# Patient Record
Sex: Male | Born: 1980
Health system: Southern US, Community
[De-identification: ages and names within clinical notes are randomized; demographics above are authoritative.]

## PROBLEM LIST (undated history)

## (undated) DIAGNOSIS — J9690 Respiratory failure, unspecified, unspecified whether with hypoxia or hypercapnia: Secondary | ICD-10-CM

## (undated) DIAGNOSIS — F419 Anxiety disorder, unspecified: Secondary | ICD-10-CM

## (undated) DIAGNOSIS — F32A Depression, unspecified: Secondary | ICD-10-CM

## (undated) DIAGNOSIS — F319 Bipolar disorder, unspecified: Secondary | ICD-10-CM

## (undated) DIAGNOSIS — G8929 Other chronic pain: Secondary | ICD-10-CM

## (undated) DIAGNOSIS — F1910 Other psychoactive substance abuse, uncomplicated: Secondary | ICD-10-CM

## (undated) DIAGNOSIS — F329 Major depressive disorder, single episode, unspecified: Secondary | ICD-10-CM

## (undated) DIAGNOSIS — J45909 Unspecified asthma, uncomplicated: Secondary | ICD-10-CM

## (undated) DIAGNOSIS — F410 Panic disorder [episodic paroxysmal anxiety] without agoraphobia: Secondary | ICD-10-CM

## (undated) DIAGNOSIS — N19 Unspecified kidney failure: Secondary | ICD-10-CM

## (undated) HISTORY — PX: FRACTURE SURGERY: SHX138

## (undated) HISTORY — PX: WRIST SURGERY: SHX841

## (undated) HISTORY — PX: ABDOMINAL SURGERY: SHX537

---

## 2015-09-15 DIAGNOSIS — K0510 Chronic gingivitis, plaque induced: Secondary | ICD-10-CM | POA: Insufficient documentation

## 2015-09-15 DIAGNOSIS — Z9049 Acquired absence of other specified parts of digestive tract: Secondary | ICD-10-CM | POA: Insufficient documentation

## 2015-09-15 DIAGNOSIS — F1011 Alcohol abuse, in remission: Secondary | ICD-10-CM | POA: Insufficient documentation

## 2015-09-15 DIAGNOSIS — F6089 Other specific personality disorders: Secondary | ICD-10-CM | POA: Diagnosis present

## 2015-09-15 DIAGNOSIS — K921 Melena: Secondary | ICD-10-CM | POA: Insufficient documentation

## 2016-04-22 DIAGNOSIS — S6292XA Unspecified fracture of left wrist and hand, initial encounter for closed fracture: Secondary | ICD-10-CM | POA: Insufficient documentation

## 2016-09-14 DIAGNOSIS — M79641 Pain in right hand: Secondary | ICD-10-CM | POA: Insufficient documentation

## 2017-09-09 DIAGNOSIS — R7989 Other specified abnormal findings of blood chemistry: Secondary | ICD-10-CM | POA: Insufficient documentation

## 2017-10-09 DIAGNOSIS — I10 Essential (primary) hypertension: Secondary | ICD-10-CM

## 2017-11-30 DIAGNOSIS — S37012A Minor contusion of left kidney, initial encounter: Secondary | ICD-10-CM | POA: Insufficient documentation

## 2018-11-29 ENCOUNTER — Encounter (HOSPITAL_COMMUNITY): Payer: Self-pay

## 2018-11-29 ENCOUNTER — Other Ambulatory Visit: Payer: Self-pay

## 2018-11-29 ENCOUNTER — Emergency Department (HOSPITAL_COMMUNITY)
Admission: EM | Admit: 2018-11-29 | Discharge: 2018-11-30 | Disposition: A | Discharge status: Home / Self Care | Payer: Self-pay | Attending: Emergency Medicine | Admitting: Emergency Medicine

## 2018-11-29 ENCOUNTER — Emergency Department (HOSPITAL_COMMUNITY): Payer: Self-pay

## 2018-11-29 DIAGNOSIS — F17210 Nicotine dependence, cigarettes, uncomplicated: Secondary | ICD-10-CM | POA: Insufficient documentation

## 2018-11-29 DIAGNOSIS — S0181XA Laceration without foreign body of other part of head, initial encounter: Secondary | ICD-10-CM

## 2018-11-29 DIAGNOSIS — Y939 Activity, unspecified: Secondary | ICD-10-CM | POA: Insufficient documentation

## 2018-11-29 DIAGNOSIS — Y999 Unspecified external cause status: Secondary | ICD-10-CM | POA: Insufficient documentation

## 2018-11-29 DIAGNOSIS — Y929 Unspecified place or not applicable: Secondary | ICD-10-CM | POA: Insufficient documentation

## 2018-11-29 DIAGNOSIS — S01411A Laceration without foreign body of right cheek and temporomandibular area, initial encounter: Secondary | ICD-10-CM | POA: Insufficient documentation

## 2018-11-29 HISTORY — DX: Major depressive disorder, single episode, unspecified: F32.9

## 2018-11-29 HISTORY — DX: Anxiety disorder, unspecified: F41.9

## 2018-11-29 HISTORY — DX: Panic disorder (episodic paroxysmal anxiety): F41.0

## 2018-11-29 HISTORY — DX: Unspecified kidney failure: N19

## 2018-11-29 HISTORY — DX: Respiratory failure, unspecified, unspecified whether with hypoxia or hypercapnia: J96.90

## 2018-11-29 HISTORY — DX: Depression, unspecified: F32.A

## 2018-11-29 MED ORDER — HYDROCODONE-ACETAMINOPHEN 5-325 MG PO TABS
1.0000 | ORAL_TABLET | Freq: Once | ORAL | Status: AC
  Administered 2018-11-29: 1 via ORAL
  Filled 2018-11-29: qty 1
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
## 2018-11-30 MED ORDER — LIDOCAINE HCL (PF) 2 % IJ SOLN
INTRAMUSCULAR | Status: AC
  Filled 2018-11-30: qty 20

## 2018-11-30 MED ORDER — POVIDONE-IODINE 10 % EX SOLN
CUTANEOUS | Status: AC
  Filled 2018-11-30: qty 15

## 2018-11-30 MED ORDER — ALPRAZOLAM 0.5 MG PO TABS
0.2500 mg | ORAL_TABLET | Freq: Once | ORAL | Status: AC
  Administered 2018-11-30: 0.25 mg via ORAL
  Filled 2018-11-30: qty 1

## 2018-11-30 MED ORDER — IBUPROFEN 400 MG PO TABS
600.0000 mg | ORAL_TABLET | Freq: Once | ORAL | Status: AC
  Administered 2018-11-30: 600 mg via ORAL
  Filled 2018-11-30: qty 2

## 2018-11-30 NOTE — Discharge Instructions (Signed)
Suture removal in 7 days

## 2018-12-07 ENCOUNTER — Other Ambulatory Visit: Payer: Self-pay

## 2018-12-07 ENCOUNTER — Emergency Department (HOSPITAL_COMMUNITY): Payer: Self-pay

## 2018-12-07 ENCOUNTER — Encounter (HOSPITAL_COMMUNITY): Payer: Self-pay

## 2018-12-07 ENCOUNTER — Emergency Department (HOSPITAL_COMMUNITY)
Admission: EM | Admit: 2018-12-07 | Discharge: 2018-12-07 | Disposition: A | Discharge status: Home / Self Care | Payer: Self-pay | Attending: Emergency Medicine | Admitting: Emergency Medicine

## 2018-12-07 DIAGNOSIS — R079 Chest pain, unspecified: Secondary | ICD-10-CM | POA: Insufficient documentation

## 2018-12-07 DIAGNOSIS — Z765 Malingerer [conscious simulation]: Secondary | ICD-10-CM | POA: Insufficient documentation

## 2018-12-07 HISTORY — DX: Other chronic pain: G89.29
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
## 2018-12-07 NOTE — ED Notes (Signed)
Not in WR when called by lab
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
## 2018-12-07 NOTE — ED Notes (Signed)
Not in WR

## 2018-12-07 NOTE — ED Notes (Signed)
Pt states he is tired of waiting and wants to leave, signed out ama

## 2018-12-08 ENCOUNTER — Encounter (HOSPITAL_COMMUNITY): Payer: Self-pay | Admitting: *Deleted

## 2018-12-08 ENCOUNTER — Other Ambulatory Visit: Payer: Self-pay

## 2018-12-08 ENCOUNTER — Emergency Department (HOSPITAL_COMMUNITY)
Admission: EM | Admit: 2018-12-08 | Discharge: 2018-12-08 | Disposition: A | Discharge status: Other Acute Inpatient Hospital | Payer: Self-pay | Attending: Emergency Medicine | Admitting: Emergency Medicine

## 2018-12-08 DIAGNOSIS — F17210 Nicotine dependence, cigarettes, uncomplicated: Secondary | ICD-10-CM | POA: Insufficient documentation

## 2018-12-08 DIAGNOSIS — F1910 Other psychoactive substance abuse, uncomplicated: Secondary | ICD-10-CM | POA: Insufficient documentation

## 2018-12-08 DIAGNOSIS — F1020 Alcohol dependence, uncomplicated: Secondary | ICD-10-CM | POA: Insufficient documentation

## 2018-12-08 DIAGNOSIS — R45851 Suicidal ideations: Secondary | ICD-10-CM | POA: Insufficient documentation

## 2018-12-08 DIAGNOSIS — Z046 Encounter for general psychiatric examination, requested by authority: Secondary | ICD-10-CM | POA: Insufficient documentation

## 2018-12-08 DIAGNOSIS — F312 Bipolar disorder, current episode manic severe with psychotic features: Secondary | ICD-10-CM | POA: Insufficient documentation

## 2018-12-08 DIAGNOSIS — F4310 Post-traumatic stress disorder, unspecified: Secondary | ICD-10-CM | POA: Insufficient documentation

## 2018-12-08 LAB — CBC WITH DIFFERENTIAL/PLATELET
Abs Immature Granulocytes: 0.04 10*3/uL (ref 0.00–0.07)
Basophils Absolute: 0.1 10*3/uL (ref 0.0–0.1)
Basophils Relative: 0 %
Eosinophils Absolute: 0 10*3/uL (ref 0.0–0.5)
Eosinophils Relative: 0 %
HCT: 45.5 % (ref 39.0–52.0)
Hemoglobin: 14.5 g/dL (ref 13.0–17.0)
Immature Granulocytes: 0 %
Lymphocytes Relative: 22 %
Lymphs Abs: 2.5 10*3/uL (ref 0.7–4.0)
MCH: 29.8 pg (ref 26.0–34.0)
MCHC: 31.9 g/dL (ref 30.0–36.0)
MCV: 93.4 fL (ref 80.0–100.0)
Monocytes Absolute: 0.7 10*3/uL (ref 0.1–1.0)
Monocytes Relative: 6 %
Neutro Abs: 8 10*3/uL — ABNORMAL HIGH (ref 1.7–7.7)
Neutrophils Relative %: 72 %
PLATELETS: 377 10*3/uL (ref 150–400)
RBC: 4.87 MIL/uL (ref 4.22–5.81)
RDW: 14 % (ref 11.5–15.5)
WBC: 11.3 10*3/uL — ABNORMAL HIGH (ref 4.0–10.5)
nRBC: 0 % (ref 0.0–0.2)

## 2018-12-08 LAB — RAPID URINE DRUG SCREEN, HOSP PERFORMED
AMPHETAMINES: NOT DETECTED
Barbiturates: NOT DETECTED
Benzodiazepines: POSITIVE — AB
Cocaine: POSITIVE — AB
Opiates: NOT DETECTED
Tetrahydrocannabinol: NOT DETECTED

## 2018-12-08 LAB — COMPREHENSIVE METABOLIC PANEL
ALT: 32 U/L (ref 0–44)
ANION GAP: 13 (ref 5–15)
AST: 31 U/L (ref 15–41)
Albumin: 5 g/dL (ref 3.5–5.0)
Alkaline Phosphatase: 96 U/L (ref 38–126)
BUN: 15 mg/dL (ref 6–20)
CALCIUM: 9.7 mg/dL (ref 8.9–10.3)
CO2: 23 mmol/L (ref 22–32)
Chloride: 106 mmol/L (ref 98–111)
Creatinine, Ser: 1.1 mg/dL (ref 0.61–1.24)
GFR calc Af Amer: >60 mL/min (ref >60)
GFR calc non Af Amer: >60 mL/min (ref >60)
Glucose, Bld: 110 mg/dL — ABNORMAL HIGH (ref 70–99)
Potassium: 3.6 mmol/L (ref 3.5–5.1)
Sodium: 142 mmol/L (ref 135–145)
Total Bilirubin: 0.3 mg/dL (ref 0.3–1.2)
Total Protein: 8.2 g/dL — ABNORMAL HIGH (ref 6.5–8.1)

## 2018-12-08 LAB — CK: Total CK: 522 U/L — ABNORMAL HIGH (ref 49–397)

## 2018-12-08 LAB — ETHANOL: Alcohol, Ethyl (B): 21 mg/dL — ABNORMAL HIGH (ref <10)

## 2018-12-08 LAB — TROPONIN I: Troponin I: <0.03 ng/mL (ref <0.03)

## 2018-12-08 LAB — ACETAMINOPHEN LEVEL: Acetaminophen (Tylenol), Serum: <10 ug/mL — ABNORMAL LOW (ref 10–30)

## 2018-12-08 LAB — SALICYLATE LEVEL

## 2018-12-08 MED ORDER — DIPHENHYDRAMINE HCL 50 MG/ML IJ SOLN
25.0000 mg | Freq: Once | INTRAMUSCULAR | Status: AC
  Administered 2018-12-08: 25 mg via INTRAMUSCULAR
  Filled 2018-12-08: qty 1

## 2018-12-08 MED ORDER — NICOTINE 21 MG/24HR TD PT24
21.0000 mg | MEDICATED_PATCH | Freq: Once | TRANSDERMAL | Status: DC
  Administered 2018-12-08: 21 mg via TRANSDERMAL
  Filled 2018-12-08: qty 1

## 2018-12-08 MED ORDER — IBUPROFEN 800 MG PO TABS
800.0000 mg | ORAL_TABLET | Freq: Once | ORAL | Status: AC
  Administered 2018-12-08: 800 mg via ORAL
  Filled 2018-12-08: qty 1

## 2018-12-08 MED ORDER — ZIPRASIDONE MESYLATE 20 MG IM SOLR: 20.0000 mg | Freq: Once | INTRAMUSCULAR | Status: DC

## 2018-12-08 MED ORDER — LORAZEPAM 2 MG/ML IJ SOLN
1.0000 mg | Freq: Once | INTRAMUSCULAR | Status: AC
  Administered 2018-12-08: 1 mg via INTRAMUSCULAR
  Filled 2018-12-08: qty 1

## 2018-12-08 MED ORDER — LORAZEPAM 1 MG PO TABS
1.0000 mg | ORAL_TABLET | Freq: Once | ORAL | Status: AC
  Administered 2018-12-08: 1 mg via ORAL
  Filled 2018-12-08: qty 1

## 2018-12-08 NOTE — ED Notes (Addendum)
Called AC to obtain sitter for pt. Gina, AC,stated one of the ED techs would have to sit.

## 2018-12-08 NOTE — ED Notes (Addendum)
Deleted note - wrong pt
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
## 2018-12-08 NOTE — ED Notes (Signed)
Ford, [facility], stated pt would be inpatient at [facility] after 10 pm.

## 2018-12-08 NOTE — ED Notes (Signed)
IVC papers faxed to Magistrate, and called.
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
## 2018-12-08 NOTE — ED Notes (Addendum)
Patient requested to be tested for HIV, and Hep C. Patient also requested a Nicotine patch. Pt stated he is very hungry. Gave pt food tray and pt states he is still hungry. Pt ate 4 packs of graham crackers and 2 peanut butters. Pt has had 2 ginger ales in addition to food tray items.

## 2018-12-09 ENCOUNTER — Encounter (HOSPITAL_COMMUNITY): Payer: Self-pay

## 2018-12-09 ENCOUNTER — Other Ambulatory Visit: Payer: Self-pay

## 2018-12-09 ENCOUNTER — Inpatient Hospital Stay (HOSPITAL_COMMUNITY)
Admission: AD | Admit: 2018-12-09 | Discharge: 2018-12-12 | DRG: 897 | Disposition: A | Discharge status: Home / Self Care | Payer: Federal, State, Local not specified - Other | Source: Intra-hospital | Attending: Psychiatry | Admitting: Psychiatry

## 2018-12-09 DIAGNOSIS — Z915 Personal history of self-harm: Secondary | ICD-10-CM

## 2018-12-09 DIAGNOSIS — G8929 Other chronic pain: Secondary | ICD-10-CM | POA: Diagnosis present

## 2018-12-09 DIAGNOSIS — G4700 Insomnia, unspecified: Secondary | ICD-10-CM | POA: Diagnosis present

## 2018-12-09 DIAGNOSIS — I10 Essential (primary) hypertension: Secondary | ICD-10-CM | POA: Diagnosis present

## 2018-12-09 DIAGNOSIS — Z590 Homelessness: Secondary | ICD-10-CM | POA: Diagnosis not present

## 2018-12-09 DIAGNOSIS — Z7951 Long term (current) use of inhaled steroids: Secondary | ICD-10-CM

## 2018-12-09 DIAGNOSIS — J449 Chronic obstructive pulmonary disease, unspecified: Secondary | ICD-10-CM | POA: Diagnosis present

## 2018-12-09 DIAGNOSIS — F6381 Intermittent explosive disorder: Secondary | ICD-10-CM | POA: Diagnosis present

## 2018-12-09 DIAGNOSIS — F1020 Alcohol dependence, uncomplicated: Principal | ICD-10-CM | POA: Diagnosis present

## 2018-12-09 DIAGNOSIS — F13239 Sedative, hypnotic or anxiolytic dependence with withdrawal, unspecified: Secondary | ICD-10-CM | POA: Diagnosis present

## 2018-12-09 DIAGNOSIS — F4310 Post-traumatic stress disorder, unspecified: Secondary | ICD-10-CM | POA: Diagnosis present

## 2018-12-09 DIAGNOSIS — F319 Bipolar disorder, unspecified: Secondary | ICD-10-CM | POA: Diagnosis not present

## 2018-12-09 DIAGNOSIS — Z888 Allergy status to other drugs, medicaments and biological substances status: Secondary | ICD-10-CM | POA: Diagnosis not present

## 2018-12-09 DIAGNOSIS — F410 Panic disorder [episodic paroxysmal anxiety] without agoraphobia: Secondary | ICD-10-CM | POA: Diagnosis present

## 2018-12-09 DIAGNOSIS — F17210 Nicotine dependence, cigarettes, uncomplicated: Secondary | ICD-10-CM | POA: Diagnosis present

## 2018-12-09 DIAGNOSIS — Z79899 Other long term (current) drug therapy: Secondary | ICD-10-CM

## 2018-12-09 DIAGNOSIS — Z885 Allergy status to narcotic agent status: Secondary | ICD-10-CM | POA: Diagnosis not present

## 2018-12-09 DIAGNOSIS — F3164 Bipolar disorder, current episode mixed, severe, with psychotic features: Secondary | ICD-10-CM | POA: Diagnosis present

## 2018-12-09 DIAGNOSIS — F1021 Alcohol dependence, in remission: Secondary | ICD-10-CM

## 2018-12-09 HISTORY — DX: Unspecified asthma, uncomplicated: J45.909

## 2018-12-09 MED ORDER — FOLIC ACID 1 MG PO TABS
1.0000 mg | ORAL_TABLET | Freq: Every day | ORAL | Status: DC
  Administered 2018-12-10 – 2018-12-12 (×3): 1 mg via ORAL
  Filled 2018-12-09 (×8): qty 1

## 2018-12-09 MED ORDER — FLUTICASONE PROPIONATE HFA 44 MCG/ACT IN AERO
2.0000 | INHALATION_SPRAY | Freq: Two times a day (BID) | RESPIRATORY_TRACT | Status: DC
  Administered 2018-12-09 – 2018-12-11 (×5): 2 via RESPIRATORY_TRACT
  Filled 2018-12-09: qty 10.6

## 2018-12-09 MED ORDER — HYDROXYZINE HCL 25 MG PO TABS
25.0000 mg | ORAL_TABLET | Freq: Three times a day (TID) | ORAL | Status: DC | PRN
  Administered 2018-12-09: 25 mg via ORAL
  Filled 2018-12-09: qty 1
  Filled 2018-12-09: qty 10
  Filled 2018-12-09: qty 1
  Filled 2018-12-09: qty 10

## 2018-12-09 MED ORDER — MAGNESIUM HYDROXIDE 400 MG/5ML PO SUSP: 30.0000 mL | Freq: Every day | ORAL | Status: DC | PRN

## 2018-12-09 MED ORDER — BUSPIRONE HCL 10 MG PO TABS
10.0000 mg | ORAL_TABLET | Freq: Two times a day (BID) | ORAL | Status: DC
  Administered 2018-12-09 – 2018-12-10 (×4): 10 mg via ORAL
  Filled 2018-12-09: qty 1
  Filled 2018-12-09: qty 2
  Filled 2018-12-09 (×6): qty 1

## 2018-12-09 MED ORDER — LOPERAMIDE HCL 2 MG PO CAPS
2.0000 mg | ORAL_CAPSULE | ORAL | Status: DC | PRN
  Administered 2018-12-09: 2 mg via ORAL
  Filled 2018-12-09: qty 1

## 2018-12-09 MED ORDER — CHLORDIAZEPOXIDE HCL 25 MG PO CAPS
25.0000 mg | ORAL_CAPSULE | Freq: Four times a day (QID) | ORAL | Status: DC | PRN
  Administered 2018-12-09 – 2018-12-11 (×2): 25 mg via ORAL
  Filled 2018-12-09 (×2): qty 1

## 2018-12-09 MED ORDER — VITAMIN B-1 100 MG PO TABS
100.0000 mg | ORAL_TABLET | Freq: Every day | ORAL | Status: DC
  Administered 2018-12-10 – 2018-12-12 (×3): 100 mg via ORAL
  Filled 2018-12-09 (×7): qty 1

## 2018-12-09 MED ORDER — QUETIAPINE FUMARATE 300 MG PO TABS
300.0000 mg | ORAL_TABLET | Freq: Every day | ORAL | Status: DC
  Administered 2018-12-09 – 2018-12-11 (×3): 300 mg via ORAL
  Filled 2018-12-09 (×3): qty 1
  Filled 2018-12-09: qty 7
  Filled 2018-12-09: qty 1

## 2018-12-09 MED ORDER — TRAZODONE HCL 50 MG PO TABS
50.0000 mg | ORAL_TABLET | Freq: Every evening | ORAL | Status: DC | PRN
  Administered 2018-12-09 – 2018-12-11 (×4): 50 mg via ORAL
  Filled 2018-12-09 (×3): qty 1
  Filled 2018-12-09: qty 7
  Filled 2018-12-09: qty 1

## 2018-12-09 MED ORDER — ALUM & MAG HYDROXIDE-SIMETH 200-200-20 MG/5ML PO SUSP: 30.0000 mL | ORAL | Status: DC | PRN

## 2018-12-09 MED ORDER — NIFEDIPINE ER 30 MG PO TB24
30.0000 mg | ORAL_TABLET | Freq: Every day | ORAL | Status: DC
  Administered 2018-12-09 – 2018-12-12 (×4): 30 mg via ORAL
  Filled 2018-12-09 (×2): qty 1
  Filled 2018-12-09: qty 7
  Filled 2018-12-09 (×4): qty 1

## 2018-12-09 MED ORDER — IBUPROFEN 400 MG PO TABS: 400.0000 mg | ORAL_TABLET | Freq: Four times a day (QID) | ORAL | Status: DC | PRN

## 2018-12-09 MED ORDER — ACETAMINOPHEN 325 MG PO TABS
650.0000 mg | ORAL_TABLET | Freq: Four times a day (QID) | ORAL | Status: DC | PRN
  Administered 2018-12-10 – 2018-12-12 (×4): 650 mg via ORAL
  Filled 2018-12-09 (×4): qty 2

## 2018-12-09 MED ORDER — QUETIAPINE FUMARATE 50 MG PO TABS
50.0000 mg | ORAL_TABLET | Freq: Every day | ORAL | Status: DC
  Administered 2018-12-09 – 2018-12-12 (×4): 50 mg via ORAL
  Filled 2018-12-09: qty 1
  Filled 2018-12-09: qty 7
  Filled 2018-12-09 (×6): qty 1

## 2018-12-09 MED ORDER — NICOTINE 21 MG/24HR TD PT24
21.0000 mg | MEDICATED_PATCH | Freq: Every day | TRANSDERMAL | Status: DC
  Administered 2018-12-09 – 2018-12-12 (×4): 21 mg via TRANSDERMAL
  Filled 2018-12-09 (×7): qty 1

## 2018-12-09 NOTE — Progress Notes (Signed)
DAR NOTE: Pt present with flat affect and depressed mood in the unit. Pt has been isolating himself and has been bed most of the am. Pt stated he had a rough night, complained of withdrawal symptoms and and has been asking for benzos   Pt denies physical pain, took all his meds as scheduled.  Pt had a poor night sleep, good appetite, normal energy, and good concentration. Pt rate depression at 8, hopeless ness at 7, and anxiety at 8. Pt's safety ensured with 15 minute and environmental checks. Pt currently denies SI/HI and A/V hallucinations. Pt verbally agrees to seek staff if SI/HI or A/VH occurs and to consult with staff before acting on these thoughts. Will continue POC.

## 2018-12-09 NOTE — Tx Team (Signed)
Initial Treatment Plan 12/09/2018 2:11 AM Lance Abbott ZOX:[id number]    PATIENT STRESSORS: Financial difficulties Health problems Marital or family conflict Substance abuse   PATIENT STRENGTHS: General fund of knowledge Motivation for treatment/growth   PATIENT IDENTIFIED PROBLEMS:  risk for suicide  Psychosis / paranoia  PTSD  "help with anything I can, better myself, get reevaluated, off alcohol, get meds right"               DISCHARGE CRITERIA:  Ability to meet basic life and health needs Adequate post-discharge living arrangements Improved stabilization in mood, thinking, and/or behavior Verbal commitment to aftercare and medication compliance  PRELIMINARY DISCHARGE PLAN: Attend aftercare/continuing care group Outpatient therapy  PATIENT/FAMILY INVOLVEMENT: This treatment plan has been presented to and reviewed with the patient, Lance Abbott.  The patient and family have been given the opportunity to ask questions and make suggestions.  Delos HaringPhillips, Chrisanne Loose A, RN 12/09/2018, 2:11 AM

## 2018-12-09 NOTE — Progress Notes (Signed)
D:  Pt did come up to take hs medications.  He declined to take vistaril.  He did take his hs medications.  He adamantly denied SI/HI.  He continued to report auditory hallucinations and described them as "helpful voices."  He remained irritable about not getting something for withdrawal.  Encouraged him to talk with the doctor tomorrow about his concerns.  He reported "I just want to get out of here so I can drink, since you guys aren't giving anything to help with withdrawal."  CIWA was assessed to be a 4 at last check.   A:  1:1 with RN for support and encouragement.  Medications as ordered.  Q 15 minute checks maintained for safety.  Encouraged participation in group and unit activities.   R:  Foster remains safe on the unit.  We will continue to monitor the progress towards his goals.
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
## 2018-12-09 NOTE — Progress Notes (Signed)
Pt refused his evening medications after he was told he could not have his librium because he did not meet the criteria. Pt used the F word and walked away.
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
## 2018-12-09 NOTE — [facility] Group Notes (Signed)
[facility] LCSW Group Therapy Note  Date/Time:  12/09/2018  11:00AM-12:00PM  Type of Therapy and Topic:  Group Therapy:  Music and Mood  Participation Level:  Did Not Attend   Description of Group: In this process group, members listened to a variety of genres of music and identified that different types of music evoke different responses.  Patients were encouraged to identify music that was soothing for them and music that was energizing for them.  Patients discussed how this knowledge can help with wellness and recovery in various ways including managing depression and anxiety as well as encouraging healthy sleep habits.    Therapeutic Goals: 1. Patients will explore the impact of different varieties of music on mood 2. Patients will verbalize the thoughts they have when listening to different types of music 3. Patients will identify music that is soothing to them as well as music that is energizing to them 4. Patients will discuss how to use this knowledge to assist in maintaining wellness and recovery 5. Patients will explore the use of music as a coping skill  Summary of Patient Progress:  N/A  Therapeutic Modalities: Solution Focused Brief Therapy Activity   Lashawndra Lampkins Grossman-Orr, LCSW    

## 2018-12-09 NOTE — Progress Notes (Signed)
Pt came up to the nurses station earlier stating he was having multiple withdrawal symptoms.  "I woke up sweaty, my bed was wet and I am having tremors."  His vital signs were stable.  RN checked bed sheets and no wetness noted.  He was noted sitting in the day room eating ice cream/popcorn and watching the Superbowl.  No tremors noted with the spoon while eating ice cream. RN gave him his 8pm inhaler which he just expelled two puffs to the air and handed it back.  Offered Vistraril for anxiety and he told RN that "you can stick the vistaril up your ass."  He continued to sit in the day room after RN left, eating popcorn watching the foot ball game.  We will continue to monitor for withdrawal symptoms.

## 2018-12-10 DIAGNOSIS — F319 Bipolar disorder, unspecified: Secondary | ICD-10-CM

## 2018-12-10 MED ORDER — CHLORDIAZEPOXIDE HCL 25 MG PO CAPS
25.0000 mg | ORAL_CAPSULE | Freq: Three times a day (TID) | ORAL | Status: AC
  Administered 2018-12-11 (×3): 25 mg via ORAL
  Filled 2018-12-10 (×3): qty 1

## 2018-12-10 MED ORDER — CHLORDIAZEPOXIDE HCL 25 MG PO CAPS: 25.0000 mg | ORAL_CAPSULE | Freq: Every day | ORAL | Status: DC

## 2018-12-10 MED ORDER — CHLORDIAZEPOXIDE HCL 25 MG PO CAPS
25.0000 mg | ORAL_CAPSULE | Freq: Four times a day (QID) | ORAL | Status: AC
  Administered 2018-12-10 (×4): 25 mg via ORAL
  Filled 2018-12-10 (×4): qty 1

## 2018-12-10 MED ORDER — CARBAMAZEPINE 200 MG PO TABS
200.0000 mg | ORAL_TABLET | Freq: Three times a day (TID) | ORAL | Status: DC
  Administered 2018-12-10 (×2): 200 mg via ORAL
  Filled 2018-12-10 (×15): qty 1

## 2018-12-10 MED ORDER — CHLORDIAZEPOXIDE HCL 25 MG PO CAPS
25.0000 mg | ORAL_CAPSULE | ORAL | Status: DC
  Administered 2018-12-12: 25 mg via ORAL
  Filled 2018-12-10: qty 1

## 2018-12-10 NOTE — [facility] Group Notes (Signed)
[facility] LCSW Group Therapy Note  Date/Time: 12/10/18, 1300  Type of Therapy and Topic:  Group Therapy:  Overcoming Obstacles  Participation Level:  Did not attend  Description of Group:    In this group patients will be encouraged to explore what they see as obstacles to their own wellness and recovery. They will be guided to discuss their thoughts, feelings, and behaviors related to these obstacles. The group will process together ways to cope with barriers, with attention given to specific choices patients can make. Each patient will be challenged to identify changes they are motivated to make in order to overcome their obstacles. This group will be process-oriented, with patients participating in exploration of their own experiences as well as giving and receiving support and challenge from other group members.  Therapeutic Goals: 1. Patient will identify personal and current obstacles as they relate to admission. 2. Patient will identify barriers that currently interfere with their wellness or overcoming obstacles.  3. Patient will identify feelings, thought process and behaviors related to these barriers. 4. Patient will identify two changes they are willing to make to overcome these obstacles:    Summary of Patient Progress      Therapeutic Modalities:   Cognitive Behavioral Therapy Solution Focused Therapy Motivational Interviewing Relapse Prevention Therapy  [employer], LCSW

## 2018-12-10 NOTE — [facility] Suicide Risk Assessment (Signed)
[facility] INPATIENT:  Family/Significant Other Suicide Prevention Education  Suicide Prevention Education:  Patient Refusal for Family/Significant Other Suicide Prevention Education: The patient Lance Abbott has refused to provide written consent for family/significant other to be provided Family/Significant Other Suicide Prevention Education during admission and/or prior to discharge.  Physician notified.  Lorri Frederick, LCSW 12/10/2018, 11:15 AM

## 2018-12-10 NOTE — Tx Team (Signed)
Interdisciplinary Treatment and Diagnostic Plan Update  12/10/2018 Time of Session: [place] MRN: [id number]  Principal Diagnosis: <principal problem not specified>  Secondary Diagnoses: Active Problems:   Bipolar disorder with psychotic features ([place])   Current Medications:  Current Facility-Administered Medications  Medication Dose Route Frequency Provider Last Rate Last Dose  . acetaminophen (TYLENOL) tablet 650 mg  650 mg Oral Q6H PRN Lindon Romp A, NP   650 mg at 12/10/18 1146  . alum & mag hydroxide-simeth (MAALOX/MYLANTA) 200-200-20 MG/5ML suspension 30 mL  30 mL Oral Q4H PRN Lindon Romp A, NP      . busPIRone (BUSPAR) tablet 10 mg  10 mg Oral BID Sharma Covert, MD   10 mg at 12/10/18 0851  . carbamazepine (TEGRETOL) tablet 200 mg  200 mg Oral TID Johnn Hai, MD   200 mg at 12/10/18 0853  . chlordiazePOXIDE (LIBRIUM) capsule 25 mg  25 mg Oral QID PRN Sharma Covert, MD   25 mg at 12/09/18 1053  . chlordiazePOXIDE (LIBRIUM) capsule 25 mg  25 mg Oral QID Johnn Hai, MD   25 mg at 12/10/18 1146   Followed by  . [START ON 12/11/2018] chlordiazePOXIDE (LIBRIUM) capsule 25 mg  25 mg Oral TID Johnn Hai, MD       Followed by  . [START ON 12/12/2018] chlordiazePOXIDE (LIBRIUM) capsule 25 mg  25 mg Oral Celine Ahr, MD       Followed by  . [START ON 12/13/2018] chlordiazePOXIDE (LIBRIUM) capsule 25 mg  25 mg Oral Daily Johnn Hai, MD      . fluticasone (FLOVENT HFA) 44 MCG/ACT inhaler 2 puff  2 puff Inhalation BID Sharma Covert, MD   2 puff at 12/10/18 [phone]  . folic acid (FOLVITE) tablet 1 mg  1 mg Oral Daily Sharma Covert, MD   1 mg at 12/10/18 0851  . hydrOXYzine (ATARAX/VISTARIL) tablet 25 mg  25 mg Oral TID PRN Lindon Romp A, NP   25 mg at 12/09/18 0200  . loperamide (IMODIUM) capsule 2 mg  2 mg Oral PRN Lindon Romp A, NP   2 mg at 12/09/18 0200  . magnesium hydroxide (MILK OF MAGNESIA) suspension 30 mL  30 mL Oral Daily PRN Lindon Romp A, NP       . nicotine (NICODERM CQ - dosed in mg/24 hours) patch 21 mg  21 mg Transdermal Daily Sharma Covert, MD   21 mg at 12/10/18 [phone]  . NIFEdipine (ADALAT CC) 24 hr tablet 30 mg  30 mg Oral Daily Sharma Covert, MD   30 mg at 12/10/18 0851  . QUEtiapine (SEROQUEL) tablet 300 mg  300 mg Oral QHS Sharma Covert, MD   300 mg at 12/09/18 2105  . QUEtiapine (SEROQUEL) tablet 50 mg  50 mg Oral Daily Sharma Covert, MD   50 mg at 12/10/18 0851  . thiamine (VITAMIN B-1) tablet 100 mg  100 mg Oral Daily Sharma Covert, MD   100 mg at 12/10/18 0851  . traZODone (DESYREL) tablet 50 mg  50 mg Oral QHS PRN Lindon Romp A, NP   50 mg at 12/09/18 2105   PTA Medications: No medications prior to admission.    Patient Stressors: Financial difficulties Health problems Marital or family conflict Substance abuse  Patient Strengths: Technical sales engineer for treatment/growth  Treatment Modalities: Medication Management, Group therapy, Case management,  1 to 1 session with clinician, Psychoeducation, Recreational therapy.   Physician  Treatment Plan for Primary Diagnosis: <principal problem not specified> Long Term Goal(s): Improvement in symptoms so as ready for discharge Improvement in symptoms so as ready for discharge   Short Term Goals: Ability to identify changes in lifestyle to reduce recurrence of condition will improve Ability to verbalize feelings will improve Ability to disclose and discuss suicidal ideas Ability to demonstrate self-control will improve Ability to identify and develop effective coping behaviors will improve Ability to maintain clinical measurements within normal limits will improve Compliance with prescribed medications will improve Ability to identify triggers associated with substance abuse/mental health issues will improve Ability to identify changes in lifestyle to reduce recurrence of condition will improve Ability to verbalize  feelings will improve Ability to disclose and discuss suicidal ideas Ability to demonstrate self-control will improve Ability to identify and develop effective coping behaviors will improve Ability to maintain clinical measurements within normal limits will improve Compliance with prescribed medications will improve Ability to identify triggers associated with substance abuse/mental health issues will improve  Medication Management: Evaluate patient's response, side effects, and tolerance of medication regimen.  Therapeutic Interventions: 1 to 1 sessions, Unit Group sessions and Medication administration.  Evaluation of Outcomes: Not Met  Physician Treatment Plan for Secondary Diagnosis: Active Problems:   Bipolar disorder with psychotic features ([place])  Long Term Goal(s): Improvement in symptoms so as ready for discharge Improvement in symptoms so as ready for discharge   Short Term Goals: Ability to identify changes in lifestyle to reduce recurrence of condition will improve Ability to verbalize feelings will improve Ability to disclose and discuss suicidal ideas Ability to demonstrate self-control will improve Ability to identify and develop effective coping behaviors will improve Ability to maintain clinical measurements within normal limits will improve Compliance with prescribed medications will improve Ability to identify triggers associated with substance abuse/mental health issues will improve Ability to identify changes in lifestyle to reduce recurrence of condition will improve Ability to verbalize feelings will improve Ability to disclose and discuss suicidal ideas Ability to demonstrate self-control will improve Ability to identify and develop effective coping behaviors will improve Ability to maintain clinical measurements within normal limits will improve Compliance with prescribed medications will improve Ability to identify triggers associated with substance  abuse/mental health issues will improve     Medication Management: Evaluate patient's response, side effects, and tolerance of medication regimen.  Therapeutic Interventions: 1 to 1 sessions, Unit Group sessions and Medication administration.  Evaluation of Outcomes: Not Met   RN Treatment Plan for Primary Diagnosis: <principal problem not specified> Long Term Goal(s): Knowledge of disease and therapeutic regimen to maintain health will improve  Short Term Goals: Ability to identify and develop effective coping behaviors will improve and Compliance with prescribed medications will improve  Medication Management: RN will administer medications as ordered by provider, will assess and evaluate patient's response and provide education to patient for prescribed medication. RN will report any adverse and/or side effects to prescribing provider.  Therapeutic Interventions: 1 on 1 counseling sessions, Psychoeducation, Medication administration, Evaluate responses to treatment, Monitor vital signs and CBGs as ordered, Perform/monitor CIWA, COWS, AIMS and Fall Risk screenings as ordered, Perform wound care treatments as ordered.  Evaluation of Outcomes: Not Met   LCSW Treatment Plan for Primary Diagnosis: <principal problem not specified> Long Term Goal(s): Safe transition to appropriate next level of care at discharge, Engage patient in therapeutic group addressing interpersonal concerns.  Short Term Goals: Engage patient in aftercare planning with referrals and resources, Increase social support  and Increase skills for wellness and recovery  Therapeutic Interventions: Assess for all discharge needs, 1 to 1 time with Social worker, Explore available resources and support systems, Assess for adequacy in community support network, Educate family and significant other(s) on suicide prevention, Complete Psychosocial Assessment, Interpersonal group therapy.  Evaluation of Outcomes: Not  Met   Progress in Treatment: Attending groups: No. Participating in groups: No. Taking medication as prescribed: Yes. Toleration medication: Yes. Family/Significant other contact made: No, will contact:  pt declined consent Patient understands diagnosis: Yes. Discussing patient identified problems/goals with staff: Yes. Medical problems stabilized or resolved: Yes. Denies suicidal/homicidal ideation: Yes. Issues/concerns per patient self-inventory: No. Other: none  New problem(s) identified: No, Describe:  none  New Short Term/Long Term Goal(s):  Patient Goals:  "get well"  Discharge Plan or Barriers:   Reason for Continuation of Hospitalization: Delusions  Medication stabilization Withdrawal symptoms  Estimated Length of Stay: 2-4 days.   Attendees: Patient:Naser Clent Jacks 12/10/2018   Physician: Dr. Jake Samples, MD 12/10/2018   Nursing: Elesa Massed, RN 12/10/2018   RN Care Manager: 12/10/2018   Social Worker: Lurline Idol, LCSW 12/10/2018   Recreational Therapist:  12/10/2018   Other:  12/10/2018   Other:  12/10/2018   Other: 12/10/2018       Scribe for Treatment Team: Joanne Chars, [place] 12/10/2018 2:30 PM
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
## 2018-12-10 NOTE — Progress Notes (Signed)
D: Pt denies SI/HI/AVh. Pt is med focused. Pt only up for snack and medications.  A: Pt was offered support and encouragement. Pt was given scheduled medications. Pt was encourage to attend groups. Q 15 minute checks were done for safety.  R: safety maintained on unit.

## 2018-12-10 NOTE — Progress Notes (Signed)
Recreation Therapy Notes  Date: 2.3.20 Time: 1000 Location: 500 Hall Dayroom  Group Topic: Anxiety  Goal Area(s) Addresses:  Patient will identify triggers for anxiety.  Patient will identify physical symptoms to anxiety.  Patient will identify coping skills to deal with anxiety.   Intervention:  Worksheet, pencils  Activity:  Introduction to Anxiety.  Patients were to identify at least 3 things that trigger anxiety, physical symptoms they have when anxious, thoughts they have when anxious and coping skills the use to deal with anxiety.  Education: Anger Management, Discharge Planning   Education Outcome: Acknowledges education/In group clarification offered/Needs additional education.   Clinical Observations/Feedback: Patient did not attend group.      Britiny Defrain, LRT/CTRS         Sherrod Toothman A 12/10/2018 11:40 AM 
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
## 2018-12-11 NOTE — Progress Notes (Signed)
Recreation Therapy Notes  INPATIENT RECREATION THERAPY ASSESSMENT  Patient Details Name: Lance Abbott MRN: [id number] DOB: 1981/01/28 Today's Date: 12/11/2018       Information Obtained From: Patient  Able to Participate in Assessment/Interview: Yes  Patient Presentation: Alert  Reason for Admission (Per Patient): Other (Comments)(PTSD)  Patient Stressors: Family, Death, Other (Comment)(Wife recently died; Dog impounded)  Coping Skills:   Isolation, Counselling psychologist, Arguments, Music, Substance Abuse, Impulsivity, Talk, Prayer, Avoidance  Leisure Interests (2+):  Individual - Other (Comment), Music - Listen(Prayer)  Frequency of Recreation/Participation: Other (Comment)(Prayer- Daily; Music- When drunk)  Awareness of Community Resources:  No  Expressed Interest in [employer] Information: No  County of Residence:  [place]  Patient Main Form of Transportation: Walk  Patient Strengths:  Physical and Mental Strength  Patient Identified Areas of Improvement:  Staying sober; Control anger  Patient Goal for Hospitalization:  "Get sober"  Current SI (including self-harm):  No  Current HI:  No  Current AVH: No  Staff Intervention Plan: Group Attendance, Collaborate with Interdisciplinary Treatment Team  Consent to Intern Participation: N/A   Caroll Rancher, LRT/CTRS  Caroll Rancher A 12/11/2018, 11:29 AM

## 2018-12-11 NOTE — Progress Notes (Signed)
Recreation Therapy Notes  Date: 2.4.20 Time: 1000 Location: 500 Hall Dayroom  Group Topic: Wellness  Goal Area(s) Addresses:  Patient will define components of whole wellness. Patient will verbalize benefit of whole wellness.  Intervention: Exercise, Music  Activity: Exercise.  LRT introduced the concept of wellness to patients.  LRT and patients then engaged in a series of stretches and exercises.  Patients took turns leading group in exercises of their choosing.  Patients could take breaks and get water as needed.  Education: Wellness, Discharge Planning.   Education Outcome: Acknowledges education/In group clarification offered/Needs additional education.   Clinical Observations/Feedback:  Pt did not attend group.     Neisha Hinger, LRT/CTRS         Mervil Wacker A 12/11/2018 11:14 AM 

## 2018-12-11 NOTE — Plan of Care (Signed)
Progress note  D: pt found in bed; pt allowed to rest. Pt compliant with medication administration. Pt states that he has had bloody stools and wet the bed last night, both of which he attributes to tegretol. This is why the patient is refusing this. Pt denies si/hi/ah/vh and verbally agrees to approach staff if these become apparent or before harming yourself or others while at bhh. Pt continues to be paranoid and watchful of others. Pt refused to go to groups today.  A: pt provided support and encouragement. Pt given medication per protocol and standing orders. Q36m safety checks implemented and continued.  R: pt safe on the unit. Will continue to monitor.   Pt progressing in the following metrics  Problem: Education: Goal: Knowledge of [place] [facility] Education information/materials will improve Outcome: Progressing Goal: Emotional status will improve Outcome: Progressing Goal: Mental status will improve Outcome: Progressing Goal: Verbalization of understanding the information provided will improve Outcome: Progressing

## 2018-12-12 DIAGNOSIS — F1021 Alcohol dependence, in remission: Secondary | ICD-10-CM

## 2018-12-12 MED ORDER — HYDROXYZINE HCL 25 MG PO TABS: 25.0000 mg | ORAL_TABLET | Freq: Three times a day (TID) | ORAL | 0 refills | Status: DC | PRN

## 2018-12-12 MED ORDER — NIFEDIPINE ER 30 MG PO TB24: 30.0000 mg | ORAL_TABLET | Freq: Every day | ORAL | 0 refills | Status: DC

## 2018-12-12 MED ORDER — TRAZODONE HCL 50 MG PO TABS: 50.0000 mg | ORAL_TABLET | Freq: Every evening | ORAL | 0 refills | Status: DC | PRN

## 2018-12-12 MED ORDER — NICOTINE 21 MG/24HR TD PT24: 21.0000 mg | MEDICATED_PATCH | Freq: Every day | TRANSDERMAL | 0 refills | Status: DC

## 2018-12-12 MED ORDER — FLUTICASONE PROPIONATE HFA 44 MCG/ACT IN AERO: 2.0000 | INHALATION_SPRAY | Freq: Two times a day (BID) | RESPIRATORY_TRACT | 12 refills | Status: DC

## 2018-12-12 MED ORDER — QUETIAPINE FUMARATE 50 MG PO TABS: 50.0000 mg | ORAL_TABLET | Freq: Every day | ORAL | 0 refills | Status: DC

## 2018-12-12 MED ORDER — QUETIAPINE FUMARATE 300 MG PO TABS: 300.0000 mg | ORAL_TABLET | Freq: Every day | ORAL | 0 refills | Status: DC

## 2018-12-12 NOTE — Plan of Care (Signed)
Discharge note  Patient verbalizes readiness for discharge. Follow up plan explained, AVS, Transition record and SRA given. Prescriptions and teaching provided Belongings returned and signed for. Suicide safety plan completed and signed. Patient verbalizes understanding. Patient denies SI/HI and assures this Clinical research associate he will seek assistance should that change. Patient discharged to lobby where sheriff was waiting.  Problem: Education: Goal: Knowledge of [place] [facility] Education information/materials will improve Outcome: Adequate for Discharge Goal: Emotional status will improve Outcome: Adequate for Discharge Goal: Mental status will improve Outcome: Adequate for Discharge Goal: Verbalization of understanding the information provided will improve Outcome: Adequate for Discharge   Problem: Activity: Goal: Interest or engagement in activities will improve Outcome: Adequate for Discharge Goal: Sleeping patterns will improve Outcome: Adequate for Discharge   Problem: Coping: Goal: Ability to verbalize frustrations and anger appropriately will improve Outcome: Adequate for Discharge Goal: Ability to demonstrate self-control will improve Outcome: Adequate for Discharge   Problem: Health Behavior/Discharge Planning: Goal: Identification of resources available to assist in meeting health care needs will improve Outcome: Adequate for Discharge Goal: Compliance with treatment plan for underlying cause of condition will improve Outcome: Adequate for Discharge   Problem: Physical Regulation: Goal: Ability to maintain clinical measurements within normal limits will improve Outcome: Adequate for Discharge   Problem: Safety: Goal: Periods of time without injury will increase Outcome: Adequate for Discharge   Problem: Education: Goal: Ability to make informed decisions regarding treatment will improve Outcome: Adequate for Discharge   Problem: Coping: Goal: Coping ability will  improve Outcome: Adequate for Discharge   Problem: Health Behavior/Discharge Planning: Goal: Identification of resources available to assist in meeting health care needs will improve Outcome: Adequate for Discharge   Problem: Medication: Goal: Compliance with prescribed medication regimen will improve Outcome: Adequate for Discharge   Problem: Self-Concept: Goal: Ability to disclose and discuss suicidal ideas will improve Outcome: Adequate for Discharge Goal: Will verbalize positive feelings about self Outcome: Adequate for Discharge   Problem: Activity: Goal: Will verbalize the importance of balancing activity with adequate rest periods Outcome: Adequate for Discharge   Problem: Education: Goal: Will be free of psychotic symptoms Outcome: Adequate for Discharge Goal: Knowledge of the prescribed therapeutic regimen will improve Outcome: Adequate for Discharge   Problem: Coping: Goal: Coping ability will improve Outcome: Adequate for Discharge Goal: Will verbalize feelings Outcome: Adequate for Discharge   Problem: Health Behavior/Discharge Planning: Goal: Compliance with prescribed medication regimen will improve Outcome: Adequate for Discharge   Problem: Nutritional: Goal: Ability to achieve adequate nutritional intake will improve Outcome: Adequate for Discharge   Problem: Role Relationship: Goal: Ability to communicate needs accurately will improve Outcome: Adequate for Discharge Goal: Ability to interact with others will improve Outcome: Adequate for Discharge   Problem: Safety: Goal: Ability to redirect hostility and anger into socially appropriate behaviors will improve Outcome: Adequate for Discharge Goal: Ability to remain free from injury will improve Outcome: Adequate for Discharge   Problem: Self-Care: Goal: Ability to participate in self-care as condition permits will improve Outcome: Adequate for Discharge   Problem: Self-Concept: Goal: Will  verbalize positive feelings about self Outcome: Adequate for Discharge   Problem: Education: Goal: Knowledge of disease or condition will improve Outcome: Adequate for Discharge Goal: Understanding of discharge needs will improve Outcome: Adequate for Discharge   Problem: Health Behavior/Discharge Planning: Goal: Ability to identify changes in lifestyle to reduce recurrence of condition will improve Outcome: Adequate for Discharge Goal: Identification of resources available to assist in meeting health  care needs will improve Outcome: Adequate for Discharge   Problem: Physical Regulation: Goal: Complications related to the disease process, condition or treatment will be avoided or minimized Outcome: Adequate for Discharge   Problem: Safety: Goal: Ability to remain free from injury will improve Outcome: Adequate for Discharge   Problem: Activity: Goal: Will identify at least one activity in which they can participate Outcome: Adequate for Discharge   Problem: Coping: Goal: Ability to identify and develop effective coping behavior will improve Outcome: Adequate for Discharge Goal: Ability to interact with others will improve Outcome: Adequate for Discharge Goal: Demonstration of participation in decision-making regarding own care will improve Outcome: Adequate for Discharge Goal: Ability to use eye contact when communicating with others will improve Outcome: Adequate for Discharge   Problem: Health Behavior/Discharge Planning: Goal: Identification of resources available to assist in meeting health care needs will improve Outcome: Adequate for Discharge   Problem: Self-Concept: Goal: Will verbalize positive feelings about self Outcome: Adequate for Discharge

## 2018-12-12 NOTE — Plan of Care (Signed)
Pt did not attend recreation therapy group sessions.   Lance Abbott, LRT/CTRS 

## 2018-12-12 NOTE — BHH Counselor (Signed)
Lance Abbott consented to contact with his father, Lance Abbott, regarding discharge planning by this writer, to request address for [facility] Department for transportation at discharge. [name] phone number and address was conveyed to [facility] department by this writer.  Marian Sorrow, MSW Intern CSW Department 12/12/2018 1:30 PM

## 2018-12-12 NOTE — BHH Counselor (Signed)
[name] requested information regarding dental assistance and eyeglasses for [facility]. This Clinical research associate spoke with patient about these resources and placed with discharge information along with contact information on the [facility] and services.  Marian Sorrow, MSW Intern CSW Department 12/12/2018 10:50 AM

## 2018-12-12 NOTE — Progress Notes (Signed)
Recreation Therapy Notes  Date: 2.5.20 Time: 1000 Location: 500 Hall Dayroom  Group Topic: Coping Skills  Goal Area(s) Addresses:  Pt will be able to identify consequences of using unhealthy coping skills. Pt will be able to identify healthy coping skills. Pt will be able to identify outcomes of using health coping skills.  Intervention:  Worksheets, pencils  Activity: Healthy vs. Unhealthy Coping Strategies.  Patients were to read up on the difference between healthy and unhealthy coping skills.  Patients were to then identify the problem they are currently facing.  Patient then identified the negative coping skills they use and they consequences of those coping skills.  Patients would then identify healthy coping skills they could use, benefits and barriers that would prevent them from using the positive coping skills.  Education: Coping Skills, Discharge Planning.   Education Outcome: Acknowledges understanding/In group clarification offered/Needs additional education.   Clinical Observations/Feedback: Pt did not attend group.    Brendia Dampier, LRT/CTRS         Lance Abbott 12/12/2018 11:09 AM 
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
## 2018-12-12 NOTE — Plan of Care (Signed)
D: Patient is alert and cooperative. Patient denies SI, HI, AVH, and verbally contracts for safety. Patient reports feeling anxious today. Patient reports withdrawal symptoms of tremor, diarrhea, and sweating. Patient affect is anxious and fidgety.    A: Scheduled medications administered per MD order. Support provided. Patient educated on safety on the unit and medications. Routine safety checks every 15 minutes. Patient stated understanding to tell nurse about any new physical symptoms. Patient understands to tell staff of any needs.     R: No adverse drug reactions noted. Patient verbally contracts for safety. Patient remains safe at this time and will continue to monitor.   Problem: Education: Goal: Knowledge of [facility] Education information/materials will improve Outcome: Progressing   Problem: Safety: Goal: Periods of time without injury will increase Outcome: Progressing   Patient oriented to the unit. Patient remains safe and will continue to monitor.

## 2018-12-12 NOTE — [facility] Group Notes (Signed)
[facility] LCSW Group Therapy Note  Date/Time: 12/11/18, 1100  Type of Therapy/Topic:  Group Therapy:  Feelings about Diagnosis  Participation Level:  Did Not Attend   Mood:   Description of Group:    This group will allow patients to explore their thoughts and feelings about diagnoses they have received. Patients will be guided to explore their level of understanding and acceptance of these diagnoses. Facilitator will encourage patients to process their thoughts and feelings about the reactions of others to their diagnosis, and will guide patients in identifying ways to discuss their diagnosis with significant others in their lives. This group will be process-oriented, with patients participating in exploration of their own experiences as well as giving and receiving support and challenge from other group members.   Therapeutic Goals: 1. Patient will demonstrate understanding of diagnosis as evidence by identifying two or more symptoms of the disorder:  2. Patient will be able to express two feelings regarding the diagnosis 3. Patient will demonstrate ability to communicate their needs through discussion and/or role plays  Summary of Patient Progress:        Therapeutic Modalities:   Cognitive Behavioral Therapy Brief Therapy Feelings Identification   Greg Squire Withey, LCSW 

## 2018-12-12 NOTE — Progress Notes (Signed)
Recreation Therapy Notes  INPATIENT RECREATION TR PLAN  Patient Details Name: Wyat Infinger MRN: [id number] DOB: 1981/03/21 Today's Date: 12/12/2018  Rec Therapy Plan Is patient appropriate for Therapeutic Recreation?: Yes Treatment times per week: about 3 days Estimated Length of Stay: 5-7 days TR Treatment/Interventions: Group participation (Comment)  Discharge Criteria Pt will be discharged from therapy if:: Discharged Treatment plan/goals/alternatives discussed and agreed upon by:: Patient/family  Discharge Summary Short term goals set: See patient care plan Short term goals met: Not met Reason goals not met: Pt did not attend groups. Therapeutic equipment acquired: N/A Reason patient discharged from therapy: Discharge from hospital Pt/family agrees with progress & goals achieved: Yes Date patient discharged from therapy: 12/12/18    Victorino Sparrow, LRT/CTRS   Ria Comment, Shatarra Wehling A 12/12/2018, 11:13 AM

## 2018-12-12 NOTE — [facility] Suicide Risk Assessment (Signed)
[facility] Discharge Suicide Risk Assessment   Principal Problem: polydrug intox Discharge Diagnoses: Active Problems:   Bipolar disorder with psychotic features (HCC)   Total Time spent with patient: 45 minutes  Alert-ox3 no si no hi No withdrawal no psychosis  Mental Status Per Nursing Assessment::   On Admission:  NA  Demographic Factors:  Male  Loss Factors: Decrease in vocational status  Historical Factors: Impulsivity  Risk Reduction Factors:   Religious beliefs about death  Continued Clinical Symptoms:  Alcohol/Substance Abuse/Dependencies  Cognitive Features That Contribute To Risk:  Thought constriction (tunnel vision)    Suicide Risk:  Minimal: No identifiable suicidal ideation.  Patients presenting with no risk factors but with morbid ruminations; may be classified as minimal risk based on the severity of the depressive symptoms  Follow-up Information    [facility] Follow up.   Why:  Your hospital follow up appointment is ...Marland KitchenMarland KitchenPlease bring your photo ID, proof of insurance, SSN, current medications, and discharge paperwork from this hospitalization.  Contact information: [address] [place] [address] [phone]           Plan Of Care/Follow-up recommendations:  Activity:  full  Carlis Burnsworth,Jarrick, MD 12/12/2018, 8:59 AM
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
## 2018-12-12 NOTE — Progress Notes (Signed)
  [facility] Adult Case Management Discharge Plan :  Will you be returning to the same living situation after discharge:  Yes,  with father At discharge, do you have transportation home?: No. [facility] to Transport.  Do you have the ability to pay for your medications: No. Will work with [employer].  Release of information consent forms completed and in the chart;  Patient's signature needed at discharge.  Patient to Follow up at: Follow-up Information    [facility] Follow up on 12/17/2018.   Why:  Your hospital follow up appointment is 2/10 at 10:00a. Please bring your photo ID, proof of insurance, SSN, current medications, and discharge paperwork from this hospitalization.  Contact information: [address] [place] [address] [phone]           Next level of care provider has access to [facility] Link:no  Safety Planning and Suicide Prevention discussed: No. Pt declined consent. SPE completed with pt.   Have you used any form of tobacco in the last 30 days? (Cigarettes, Smokeless Tobacco, Cigars, and/or Pipes): Yes  Has patient been referred to the Quitline?: Patient refused referral  Patient has been referred for addiction treatment: Yes, [facility].   Lorri Frederick, LCSW 12/12/2018, 10:24 AM

## 2019-01-10 ENCOUNTER — Encounter (HOSPITAL_COMMUNITY): Payer: Self-pay | Admitting: Emergency Medicine

## 2019-01-10 ENCOUNTER — Other Ambulatory Visit: Payer: Self-pay

## 2019-01-10 ENCOUNTER — Observation Stay (HOSPITAL_COMMUNITY): Payer: Self-pay

## 2019-01-10 ENCOUNTER — Inpatient Hospital Stay (HOSPITAL_COMMUNITY)
Admission: EM | Admit: 2019-01-10 | Discharge: 2019-01-13 | DRG: 603 | Disposition: A | Discharge status: Home / Self Care | Payer: Self-pay | Attending: Internal Medicine | Admitting: Internal Medicine

## 2019-01-10 DIAGNOSIS — K089 Disorder of teeth and supporting structures, unspecified: Secondary | ICD-10-CM

## 2019-01-10 DIAGNOSIS — Z888 Allergy status to other drugs, medicaments and biological substances status: Secondary | ICD-10-CM

## 2019-01-10 DIAGNOSIS — Z79899 Other long term (current) drug therapy: Secondary | ICD-10-CM

## 2019-01-10 DIAGNOSIS — R51 Headache: Secondary | ICD-10-CM | POA: Diagnosis present

## 2019-01-10 DIAGNOSIS — K0889 Other specified disorders of teeth and supporting structures: Secondary | ICD-10-CM | POA: Diagnosis present

## 2019-01-10 DIAGNOSIS — I10 Essential (primary) hypertension: Secondary | ICD-10-CM

## 2019-01-10 DIAGNOSIS — F1021 Alcohol dependence, in remission: Secondary | ICD-10-CM

## 2019-01-10 DIAGNOSIS — F410 Panic disorder [episodic paroxysmal anxiety] without agoraphobia: Secondary | ICD-10-CM | POA: Diagnosis present

## 2019-01-10 DIAGNOSIS — F17210 Nicotine dependence, cigarettes, uncomplicated: Secondary | ICD-10-CM | POA: Diagnosis present

## 2019-01-10 DIAGNOSIS — J45909 Unspecified asthma, uncomplicated: Secondary | ICD-10-CM | POA: Diagnosis present

## 2019-01-10 DIAGNOSIS — F1910 Other psychoactive substance abuse, uncomplicated: Secondary | ICD-10-CM | POA: Diagnosis present

## 2019-01-10 DIAGNOSIS — F319 Bipolar disorder, unspecified: Secondary | ICD-10-CM | POA: Diagnosis present

## 2019-01-10 DIAGNOSIS — Z205 Contact with and (suspected) exposure to viral hepatitis: Secondary | ICD-10-CM | POA: Diagnosis present

## 2019-01-10 DIAGNOSIS — L03114 Cellulitis of left upper limb: Principal | ICD-10-CM | POA: Diagnosis present

## 2019-01-10 DIAGNOSIS — L03012 Cellulitis of left finger: Secondary | ICD-10-CM

## 2019-01-10 DIAGNOSIS — F1490 Cocaine use, unspecified, uncomplicated: Secondary | ICD-10-CM | POA: Diagnosis present

## 2019-01-10 LAB — CBC WITH DIFFERENTIAL/PLATELET
Abs Immature Granulocytes: 0.16 10*3/uL — ABNORMAL HIGH (ref 0.00–0.07)
Basophils Absolute: 0.1 10*3/uL (ref 0.0–0.1)
Basophils Relative: 1 %
EOS PCT: 1 %
Eosinophils Absolute: 0.2 10*3/uL (ref 0.0–0.5)
HCT: 45.3 % (ref 39.0–52.0)
Hemoglobin: 14.8 g/dL (ref 13.0–17.0)
Immature Granulocytes: 1 %
LYMPHS PCT: 17 %
Lymphs Abs: 2.5 10*3/uL (ref 0.7–4.0)
MCH: 29.2 pg (ref 26.0–34.0)
MCHC: 32.7 g/dL (ref 30.0–36.0)
MCV: 89.5 fL (ref 80.0–100.0)
Monocytes Absolute: 1.1 10*3/uL — ABNORMAL HIGH (ref 0.1–1.0)
Monocytes Relative: 7 %
Neutro Abs: 10.6 10*3/uL — ABNORMAL HIGH (ref 1.7–7.7)
Neutrophils Relative %: 73 %
Platelets: 378 10*3/uL (ref 150–400)
RBC: 5.06 MIL/uL (ref 4.22–5.81)
RDW: 13.2 % (ref 11.5–15.5)
WBC: 14.6 10*3/uL — AB (ref 4.0–10.5)
nRBC: 0.1 % (ref 0.0–0.2)

## 2019-01-10 LAB — COMPREHENSIVE METABOLIC PANEL
ALK PHOS: 127 U/L — AB (ref 38–126)
ALT: 34 U/L (ref 0–44)
ANION GAP: 13 (ref 5–15)
AST: 30 U/L (ref 15–41)
Albumin: 5.1 g/dL — ABNORMAL HIGH (ref 3.5–5.0)
BUN: 15 mg/dL (ref 6–20)
CALCIUM: 9.7 mg/dL (ref 8.9–10.3)
CO2: 18 mmol/L — ABNORMAL LOW (ref 22–32)
Chloride: 103 mmol/L (ref 98–111)
Creatinine, Ser: 1.2 mg/dL (ref 0.61–1.24)
GFR calc Af Amer: >60 mL/min (ref >60)
GFR calc non Af Amer: >60 mL/min (ref >60)
GLUCOSE: 118 mg/dL — AB (ref 70–99)
Potassium: 4.9 mmol/L (ref 3.5–5.1)
Sodium: 134 mmol/L — ABNORMAL LOW (ref 135–145)
Total Bilirubin: 0.8 mg/dL (ref 0.3–1.2)
Total Protein: 8.6 g/dL — ABNORMAL HIGH (ref 6.5–8.1)

## 2019-01-10 LAB — LACTIC ACID, PLASMA: Lactic Acid, Venous: 1.6 mmol/L (ref 0.5–1.9)

## 2019-01-10 LAB — SEDIMENTATION RATE: SED RATE: 10 mm/h (ref 0–16)

## 2019-01-10 MED ORDER — VITAMIN B-1 100 MG PO TABS
100.0000 mg | ORAL_TABLET | Freq: Every day | ORAL | Status: DC
  Administered 2019-01-10 – 2019-01-11 (×2): 100 mg via ORAL
  Filled 2019-01-10 (×2): qty 1

## 2019-01-10 MED ORDER — ADULT MULTIVITAMIN W/MINERALS CH
1.0000 | ORAL_TABLET | Freq: Every day | ORAL | Status: DC
  Administered 2019-01-10 – 2019-01-11 (×2): 1 via ORAL
  Filled 2019-01-10 (×2): qty 1

## 2019-01-10 MED ORDER — HYDROXYZINE HCL 10 MG PO TABS
10.0000 mg | ORAL_TABLET | Freq: Three times a day (TID) | ORAL | Status: DC | PRN
  Filled 2019-01-10: qty 1

## 2019-01-10 MED ORDER — ACETAMINOPHEN 500 MG PO TABS
1000.0000 mg | ORAL_TABLET | Freq: Four times a day (QID) | ORAL | Status: DC | PRN
  Administered 2019-01-11 – 2019-01-12 (×2): 1000 mg via ORAL
  Filled 2019-01-10 (×3): qty 2

## 2019-01-10 MED ORDER — VANCOMYCIN HCL IN DEXTROSE 1-5 GM/200ML-% IV SOLN
1000.0000 mg | Freq: Once | INTRAVENOUS | Status: AC
  Administered 2019-01-10: 1000 mg via INTRAVENOUS
  Filled 2019-01-10: qty 200

## 2019-01-10 MED ORDER — TRAZODONE HCL 50 MG PO TABS
50.0000 mg | ORAL_TABLET | Freq: Every evening | ORAL | Status: DC | PRN
  Administered 2019-01-11 – 2019-01-12 (×3): 50 mg via ORAL
  Filled 2019-01-10 (×3): qty 1

## 2019-01-10 MED ORDER — ONDANSETRON HCL 4 MG/2ML IJ SOLN: 4.0000 mg | Freq: Four times a day (QID) | INTRAMUSCULAR | Status: DC | PRN

## 2019-01-10 MED ORDER — HYDROXYZINE HCL 25 MG PO TABS
25.0000 mg | ORAL_TABLET | Freq: Three times a day (TID) | ORAL | Status: DC | PRN
  Administered 2019-01-10 – 2019-01-13 (×3): 25 mg via ORAL
  Filled 2019-01-10 (×3): qty 1

## 2019-01-10 MED ORDER — HYDROCODONE-ACETAMINOPHEN 5-325 MG PO TABS
2.0000 | ORAL_TABLET | Freq: Once | ORAL | Status: AC
  Administered 2019-01-10: 2 via ORAL
  Filled 2019-01-10: qty 2

## 2019-01-10 MED ORDER — ONDANSETRON HCL 4 MG/2ML IJ SOLN
4.0000 mg | Freq: Once | INTRAMUSCULAR | Status: AC
  Administered 2019-01-10: 4 mg via INTRAVENOUS
  Filled 2019-01-10: qty 2

## 2019-01-10 MED ORDER — FOLIC ACID 1 MG PO TABS
1.0000 mg | ORAL_TABLET | Freq: Every day | ORAL | Status: DC
  Administered 2019-01-10 – 2019-01-11 (×2): 1 mg via ORAL
  Filled 2019-01-10 (×2): qty 1

## 2019-01-10 MED ORDER — SODIUM CHLORIDE 0.9 % IV BOLUS
1000.0000 mL | Freq: Once | INTRAVENOUS | Status: AC
  Administered 2019-01-10: 1000 mL via INTRAVENOUS

## 2019-01-10 MED ORDER — QUETIAPINE FUMARATE 25 MG PO TABS: 50.0000 mg | ORAL_TABLET | Freq: Every day | ORAL | Status: DC | PRN

## 2019-01-10 MED ORDER — ENOXAPARIN SODIUM 40 MG/0.4ML ~~LOC~~ SOLN
40.0000 mg | SUBCUTANEOUS | Status: DC
  Filled 2019-01-10: qty 0.4

## 2019-01-10 MED ORDER — VANCOMYCIN HCL IN DEXTROSE 750-5 MG/150ML-% IV SOLN
750.0000 mg | Freq: Two times a day (BID) | INTRAVENOUS | Status: DC
  Administered 2019-01-11 – 2019-01-12 (×4): 750 mg via INTRAVENOUS
  Filled 2019-01-10 (×4): qty 150

## 2019-01-10 MED ORDER — THIAMINE HCL 100 MG/ML IJ SOLN: 100.0000 mg | Freq: Every day | INTRAMUSCULAR | Status: DC

## 2019-01-10 MED ORDER — HYDROCODONE-ACETAMINOPHEN 5-325 MG PO TABS
1.0000 | ORAL_TABLET | Freq: Once | ORAL | Status: AC
  Administered 2019-01-10: 1 via ORAL
  Filled 2019-01-10: qty 1

## 2019-01-10 MED ORDER — IBUPROFEN 800 MG PO TABS
800.0000 mg | ORAL_TABLET | Freq: Three times a day (TID) | ORAL | Status: DC | PRN
  Administered 2019-01-10 – 2019-01-11 (×2): 800 mg via ORAL
  Filled 2019-01-10 (×2): qty 1

## 2019-01-10 MED ORDER — QUETIAPINE FUMARATE 100 MG PO TABS
300.0000 mg | ORAL_TABLET | Freq: Every day | ORAL | Status: DC
  Administered 2019-01-10 – 2019-01-12 (×3): 300 mg via ORAL
  Filled 2019-01-10 (×3): qty 3

## 2019-01-10 NOTE — Progress Notes (Addendum)
Pharmacy Antibiotic Note  Lance Abbott is a 38 y.o. male admitted on 01/10/2019 with cellulitis.  Pharmacy has been consulted for Vancomycin dosing.  Plan: Vancomycin 2000mg  loading dose, then 750mg  IV every 12 hours.  Goal trough 10-15 mcg/mL.  Zosyn 3.375g IV every 8 hours. F/u cxs and clinical progress Monitor V/S, labs, and levels as indicated  Height: 5\' 10"  (177.8 cm) Weight: 230 lb (104.3 kg) IBW/kg (Calculated) : 73  Temp (24hrs), Avg:99.1 F (37.3 C), Min:98.1 F (36.7 C), Max:100.6 F (38.1 C)  Recent Labs  Lab 01/10/19 1344 01/10/19 1702  WBC 14.6*  --   CREATININE 1.20  --   LATICACIDVEN  --  1.6    Estimated Creatinine Clearance: 101.9 mL/min (by C-G formula based on SCr of 1.2 mg/dL).    Allergies  Allergen Reactions  . Cogentin [Benztropine] Shortness Of Breath  . Zyprexa [Olanzapine] Shortness Of Breath  . Celexa [Citalopram Hydrobromide]     Psychotic episodes-triggers PTSD  . Depakote [Valproic Acid] Other (See Comments)    Altered mental status  . Effexor [Venlafaxine] Other (See Comments)    Hallucinations  . Geodon [Ziprasidone Hcl]     Reaction is unknown  . Haldol [Haloperidol]     "locks me up"  . Hydrochlorothiazide     "death" due to kidney failure/problems  . Lasix [Furosemide]     "death" due to kidney problems  . Lexapro [Escitalopram]     Altered mental status-"manic"  . Lisinopril     "death" from kidney injury  . Lithium Other (See Comments)    Altered mental status  . Neurontin [Gabapentin] Other (See Comments)    Paradoxical response  . Thorazine [Chlorpromazine]     "bout killed me"   . Topamax [Topiramate] Other (See Comments)    Hair Loss  . Toradol [Ketorolac Tromethamine] Hives  . Tramadol Hives    Antimicrobials this admission: Vancomycin 3/5 >>  Zosyn 3/6 >>  Dose adjustments this admission: n/a  Microbiology results: 3/5 BCx: ngtd  Thank you for allowing pharmacy to be a part of this patient's  care.  Judeth Cornfield, PharmD Clinical Pharmacist 01/11/2019 12:18 PM
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
## 2019-01-10 NOTE — ED Triage Notes (Signed)
Pt c/o LT hand pain and swelling. Pt has redness and swelling from what started as a pimple. Pt has been taking bactrim for the past week, but symptoms have worsened. Pt states that he began having fevers over the past 3 days. Nausea began today.

## 2019-01-11 ENCOUNTER — Observation Stay (HOSPITAL_COMMUNITY): Payer: Self-pay

## 2019-01-11 DIAGNOSIS — F319 Bipolar disorder, unspecified: Secondary | ICD-10-CM

## 2019-01-11 DIAGNOSIS — I10 Essential (primary) hypertension: Secondary | ICD-10-CM

## 2019-01-11 DIAGNOSIS — F1910 Other psychoactive substance abuse, uncomplicated: Secondary | ICD-10-CM

## 2019-01-11 LAB — BASIC METABOLIC PANEL
Anion gap: 7 (ref 5–15)
BUN: 13 mg/dL (ref 6–20)
CO2: 23 mmol/L (ref 22–32)
Calcium: 9.1 mg/dL (ref 8.9–10.3)
Chloride: 108 mmol/L (ref 98–111)
Creatinine, Ser: 1.03 mg/dL (ref 0.61–1.24)
GFR calc Af Amer: >60 mL/min (ref >60)
GLUCOSE: 105 mg/dL — AB (ref 70–99)
Potassium: 4.1 mmol/L (ref 3.5–5.1)
Sodium: 138 mmol/L (ref 135–145)

## 2019-01-11 LAB — CBC
HCT: 42.6 % (ref 39.0–52.0)
Hemoglobin: 14 g/dL (ref 13.0–17.0)
MCH: 30.3 pg (ref 26.0–34.0)
MCHC: 32.9 g/dL (ref 30.0–36.0)
MCV: 92.2 fL (ref 80.0–100.0)
PLATELETS: 344 10*3/uL (ref 150–400)
RBC: 4.62 MIL/uL (ref 4.22–5.81)
RDW: 13.2 % (ref 11.5–15.5)
WBC: 11.1 10*3/uL — ABNORMAL HIGH (ref 4.0–10.5)
nRBC: 0 % (ref 0.0–0.2)

## 2019-01-11 LAB — C-REACTIVE PROTEIN: CRP: 1 mg/dL — ABNORMAL HIGH (ref <1.0)

## 2019-01-11 MED ORDER — LORAZEPAM 2 MG/ML IJ SOLN
1.0000 mg | Freq: Four times a day (QID) | INTRAMUSCULAR | Status: DC | PRN
  Administered 2019-01-11 – 2019-01-13 (×2): 1 mg via INTRAVENOUS
  Filled 2019-01-11: qty 1

## 2019-01-11 MED ORDER — HYDROCODONE-ACETAMINOPHEN 5-325 MG PO TABS
1.0000 | ORAL_TABLET | Freq: Four times a day (QID) | ORAL | Status: DC | PRN
  Administered 2019-01-11 – 2019-01-13 (×9): 2 via ORAL
  Filled 2019-01-11 (×9): qty 2

## 2019-01-11 MED ORDER — QUETIAPINE FUMARATE 25 MG PO TABS
50.0000 mg | ORAL_TABLET | Freq: Every day | ORAL | Status: DC
  Administered 2019-01-11 – 2019-01-13 (×3): 50 mg via ORAL
  Filled 2019-01-11 (×3): qty 2

## 2019-01-11 MED ORDER — VITAMIN B-1 100 MG PO TABS
100.0000 mg | ORAL_TABLET | Freq: Every day | ORAL | Status: DC
  Administered 2019-01-12 – 2019-01-13 (×2): 100 mg via ORAL
  Filled 2019-01-11 (×2): qty 1

## 2019-01-11 MED ORDER — FOLIC ACID 1 MG PO TABS
1.0000 mg | ORAL_TABLET | Freq: Every day | ORAL | Status: DC
  Administered 2019-01-12 – 2019-01-13 (×2): 1 mg via ORAL
  Filled 2019-01-11 (×2): qty 1

## 2019-01-11 MED ORDER — LORAZEPAM 2 MG/ML IJ SOLN
0.0000 mg | Freq: Four times a day (QID) | INTRAMUSCULAR | Status: AC
  Administered 2019-01-11 – 2019-01-13 (×5): 1 mg via INTRAVENOUS
  Filled 2019-01-11 (×6): qty 1

## 2019-01-11 MED ORDER — PIPERACILLIN-TAZOBACTAM 3.375 G IVPB
3.3750 g | Freq: Three times a day (TID) | INTRAVENOUS | Status: DC
  Administered 2019-01-11 – 2019-01-13 (×5): 3.375 g via INTRAVENOUS
  Filled 2019-01-11 (×5): qty 50

## 2019-01-11 MED ORDER — THIAMINE HCL 100 MG/ML IJ SOLN: 100.0000 mg | Freq: Every day | INTRAMUSCULAR | Status: DC

## 2019-01-11 MED ORDER — PIPERACILLIN-TAZOBACTAM 3.375 G IVPB
3.3750 g | Freq: Once | INTRAVENOUS | Status: AC
  Administered 2019-01-11: 3.375 g via INTRAVENOUS
  Filled 2019-01-11: qty 50

## 2019-01-11 MED ORDER — ADULT MULTIVITAMIN W/MINERALS CH
1.0000 | ORAL_TABLET | Freq: Every day | ORAL | Status: DC
  Administered 2019-01-12 – 2019-01-13 (×2): 1 via ORAL
  Filled 2019-01-11 (×2): qty 1

## 2019-01-11 MED ORDER — LORAZEPAM 2 MG/ML IJ SOLN: 0.0000 mg | Freq: Two times a day (BID) | INTRAMUSCULAR | Status: DC

## 2019-01-11 MED ORDER — LORAZEPAM 1 MG PO TABS
1.0000 mg | ORAL_TABLET | Freq: Four times a day (QID) | ORAL | Status: DC | PRN
  Administered 2019-01-12 – 2019-01-13 (×2): 1 mg via ORAL
  Filled 2019-01-11 (×2): qty 1

## 2019-01-11 MED ORDER — GADOBUTROL 1 MMOL/ML IV SOLN
10.0000 mL | Freq: Once | INTRAVENOUS | Status: AC | PRN
  Administered 2019-01-11: 10 mL via INTRAVENOUS

## 2019-01-11 NOTE — Care Management Note (Signed)
Case Management Note  Patient Details  Name: Rapheal Kaczynski MRN: [id number] Date of Birth: 08-Jun-1981  Subjective/Objective:    Admitted with cellulitis. Pt from home, uninsured, unemployed, no PCP.                 Action/Plan: Pt given MATCH voucher for med assistance. Pt made f/u appointment with [facility].   Expected Discharge Date:    01/11/2019              Expected Discharge Plan:  Home/Self Care  In-House Referral:  Financial Counselor  Discharge planning Services  CM Consult, Follow-up appt scheduled, [facility]  Status of Service:  Completed, signed off  If discussed at Long Length of Stay Meetings, dates discussed:    Additional Comments:  Malcolm Metro, RN 01/11/2019, 12:08 PM

## 2019-01-11 NOTE — Progress Notes (Signed)
PROGRESS NOTE    Lance Abbott  RRN:[id number] DOB: Jun 03, 1981 DOA: 01/10/2019 PCP: Patient, No Pcp Per    Brief Narrative:  38 year old male with a history of hypertension, bipolar disorder, presents with left hand infection.  He is admitted to the hospital for intravenous antibiotics.   Assessment & Plan:   Principal Problem:   Cellulitis of left hand Active Problems:   Bipolar disorder with psychotic features (HCC)   Alcohol use disorder, severe, in early remission (HCC)   Essential hypertension   1. Cellulitis of left hand.  Patient reports that he may have had an insect bite on the right hand and developed subsequent cellulitis.  He was started on vancomycin yesterday.  Today he feels that his swelling is somewhat worse.  Continues to have pain in this area.  We will add Zosyn to antibiotic regimen.  Since symptoms were not improving, will ask for orthopedic surgery evaluation.  Follow-up blood cultures. 2. Hypertension.  Previously on nifedipine.  Currently blood pressure stable.  Continue to monitor. 3. Bipolar disorder/depression.  Continue on home dose of Seroquel, trazodone.  Atarax as needed. 4. Alcohol use disorder.  Reported the last drink was approximate 1 week ago.  Continue on CIWA protocol. 5. Poor dentition/tooth pain.  Patient has seen oral surgery in the past.  He is recommended to follow-up with his oral surgeon on discharge. 6. Cocaine use.  Urine drug screen positive for cocaine.   DVT prophylaxis: Lovenox Code Status: Full code Family Communication: No family present Disposition Plan: Discharge home once cellulitis is improved   Consultants:   Orthopedics  Procedures:     Antimicrobials:   Vancomycin 3/5 >  Zosyn 3/6 >   Subjective: Patient has a variety of complaints today.  Complains of pain in his left hand.  Also has pain in his teeth.  Complains of headache as well as pain in his right eye.  Objective: Vitals:   01/10/19 2052 01/11/19  0100 01/11/19 0651 01/11/19 1800  BP: (!) 158/105 136/89 (!) 115/91 (!) 133/94  Pulse: 90 90 81 97  Resp: 18 18 18 18   Temp: 98.5 F (36.9 C) 98.4 F (36.9 C) (!) 97.4 F (36.3 C) 98.5 F (36.9 C)  TempSrc: Oral Oral Oral Oral  SpO2: 100% 100% 98% 98%  Weight: 97.7 kg     Height: 5\' 10"  (1.778 m)       Intake/Output Summary (Last 24 hours) at 01/11/2019 1946 Last data filed at 01/11/2019 0900 Gross per 24 hour  Intake 390 ml  Output -  Net 390 ml   Filed Weights   01/10/19 1310 01/10/19 2052  Weight: 104.3 kg 97.7 kg    Examination:  General exam: Appears calm and comfortable  Respiratory system: Clear to auscultation. Respiratory effort normal. Cardiovascular system: S1 & S2 heard, RRR. No JVD, murmurs, rubs, gallops or clicks. No pedal edema. Gastrointestinal system: Abdomen is nondistended, soft and nontender. No organomegaly or masses felt. Normal bowel sounds heard. Central nervous system: Alert and oriented. No focal neurological deficits. Extremities: Swelling over dorsal aspect of left hand Psychiatry: Judgement and insight appear normal. Mood & affect appropriate.     Data Reviewed: I have personally reviewed following labs and imaging studies  CBC: Recent Labs  Lab 01/10/19 1344 01/11/19 0638  WBC 14.6* 11.1*  NEUTROABS 10.6*  --   HGB 14.8 14.0  HCT 45.3 42.6  MCV 89.5 92.2  PLT 378 344   Basic Metabolic Panel: Recent Labs  Lab 01/10/19  1344 01/11/19 0638  NA 134* 138  K 4.9 4.1  CL 103 108  CO2 18* 23  GLUCOSE 118* 105*  BUN 15 13  CREATININE 1.20 1.03  CALCIUM 9.7 9.1   GFR: Estimated Creatinine Clearance: 115.1 mL/min (by C-G formula based on SCr of 1.03 mg/dL). Liver Function Tests: Recent Labs  Lab 01/10/19 1344  AST 30  ALT 34  ALKPHOS 127*  BILITOT 0.8  PROT 8.6*  ALBUMIN 5.1*   No results for input(s): LIPASE, AMYLASE in the last 168 hours. No results for input(s): AMMONIA in the last 168 hours. Coagulation Profile: No  results for input(s): INR, PROTIME in the last 168 hours. Cardiac Enzymes: No results for input(s): CKTOTAL, CKMB, CKMBINDEX, TROPONINI in the last 168 hours. BNP (last 3 results) No results for input(s): PROBNP in the last 8760 hours. HbA1C: No results for input(s): HGBA1C in the last 72 hours. CBG: No results for input(s): GLUCAP in the last 168 hours. Lipid Profile: No results for input(s): CHOL, HDL, LDLCALC, TRIG, CHOLHDL, LDLDIRECT in the last 72 hours. Thyroid Function Tests: No results for input(s): TSH, T4TOTAL, FREET4, T3FREE, THYROIDAB in the last 72 hours. Anemia Panel: No results for input(s): VITAMINB12, FOLATE, FERRITIN, TIBC, IRON, RETICCTPCT in the last 72 hours. Sepsis Labs: Recent Labs  Lab 01/10/19 1702  LATICACIDVEN 1.6    Recent Results (from the past 240 hour(s))  Culture, blood (Routine X 2) w Reflex to ID Panel     Status: None (Preliminary result)   Collection Time: 01/10/19  5:07 PM  Result Value Ref Range Status   Specimen Description LEFT ANTECUBITAL  Final   Special Requests   Final    BOTTLES DRAWN AEROBIC AND ANAEROBIC Blood Culture adequate volume   Culture   Final    NO GROWTH < 24 HOURS Performed at [facility], [address], [place], [place] [address]    Report Status PENDING  Incomplete  Culture, blood (Routine X 2) w Reflex to ID Panel     Status: None (Preliminary result)   Collection Time: 01/10/19  8:31 PM  Result Value Ref Range Status   Specimen Description BLOOD LEFT ARM  Final   Special Requests   Final    BOTTLES DRAWN AEROBIC AND ANAEROBIC Blood Culture adequate volume   Culture   Final    NO GROWTH < 12 HOURS Performed at [facility], [address] [place], [place] [address]    Report Status PENDING  Incomplete         Radiology Studies: Dg Wrist Complete Left  Result Date: 01/10/2019 CLINICAL DATA:  The patient states he felt like a spider bit him in the left posterior wrist on the ulnar side about a week ago,  since then he has developed swelling and pus that he once tried to pop. A few days ago he began having a fever, chills, and worsening pain to the left hand / left wrist. Hx of left wrist fx. EXAM: LEFT WRIST - COMPLETE 3+ VIEW COMPARISON:  None. FINDINGS: No fracture or bone lesion. Joints are normally spaced and aligned. There is no bone resorption to suggest osteomyelitis. There is posterior soft tissue swelling. No radiopaque foreign body. No soft tissue air. IMPRESSION: No fracture, bone lesion, evidence of osteomyelitis or radiopaque foreign body or soft tissue air. Electronically Signed   By: Amie Portland M.D.   On: 01/10/2019 19:56   Mr Wrist Left W Wo Contrast  Result Date: 01/11/2019 CLINICAL DATA:  Hand  pain and swelling. Bit on the back of the hand 1 week ago. EXAM: MR OF THE LEFT WRIST WITHOUT AND WITH CONTRAST TECHNIQUE: Multiplanar multisequence MR imaging of the left wrist was performed both before and after the administration of intravenous contrast. CONTRAST:  10 mL Gadavist COMPARISON:  None. FINDINGS: Ligaments: Intact scapholunate and lunotriquetral ligaments. Triangular fibrocartilage: Intact. Tendons: Intact flexor compartment tendons. Mild tendinosis of the extensor carpi ulnaris tendon. Carpal tunnel/median nerve: Normal carpal tunnel. Normal median nerve. Guyon's canal: Normal. Joint/cartilage: No chondral defect.  No joint effusion. Bones/carpal alignment: No acute osseous abnormality. No periosteal reaction or bone destruction. Normal alignment. Other: No fluid collection or hematoma. Severe severe soft tissue edema and enhancement along the dorsal aspect of the wrist and hand most consistent with severe cellulitis. 3 x 5 x 7 mm fluid collection in the subcutaneous fat along the dorsal radial aspect of the wrist which may reflect a small abscess versus necrosis. IMPRESSION: 1. Severe cellulitis along the dorsal aspect of the wrist and hand with a 3 x 5 x 7 mm abscess or area of  necrosis along the dorsal radial aspect of the wrist. 2. Mild tendinosis of the extensor carpi ulnaris tendon. Electronically Signed   By: Elige Ko   On: 01/11/2019 14:43        Scheduled Meds: . enoxaparin (LOVENOX) injection  40 mg Subcutaneous Q24H  . folic acid  1 mg Oral Daily  . LORazepam  0-4 mg Intravenous Q6H   Followed by  . [START ON 01/13/2019] LORazepam  0-4 mg Intravenous Q12H  . multivitamin with minerals  1 tablet Oral Daily  . QUEtiapine  300 mg Oral QHS  . QUEtiapine  50 mg Oral Daily  . thiamine  100 mg Oral Daily   Or  . thiamine  100 mg Intravenous Daily   Continuous Infusions: . piperacillin-tazobactam (ZOSYN)  IV    . vancomycin 750 mg (01/11/19 1754)     LOS: 0 days    Time spent:    Erick Blinks, MD [facility]   If 7PM-7AM, please contact night-coverage [email or web address]  01/11/2019, 7:46 PM

## 2019-01-11 NOTE — Progress Notes (Signed)
Patient ID: Lance Abbott, male   DOB: 13-May-1981, 38 y.o.   MRN: [id number] Consult request put in today at about 1215 (patient been in the hospital since yesterday at 630) question why it so long to place the consult.  I have arranged for the patient to have an MRI of his wrist to evaluate for possible intra-articular infection/septic arthritis

## 2019-01-11 NOTE — Progress Notes (Signed)
Patient ID: Lance Abbott, male   DOB: 06-09-1981, 38 y.o.   MRN: [id number] Mri complete and reviewed  THERE IS NO DRAINABLE ABSCESS   CONTINU IV ATBX AND HAND ELEVATION  FORMAL EVAL AN NOTE IN AM

## 2019-01-12 DIAGNOSIS — L03114 Cellulitis of left upper limb: Principal | ICD-10-CM

## 2019-01-12 LAB — HIV ANTIBODY (ROUTINE TESTING W REFLEX): HIV Screen 4th Generation wRfx: NONREACTIVE

## 2019-01-12 MED ORDER — VANCOMYCIN HCL 10 G IV SOLR
1250.0000 mg | Freq: Two times a day (BID) | INTRAVENOUS | Status: DC
  Administered 2019-01-13: 1250 mg via INTRAVENOUS
  Filled 2019-01-12 (×3): qty 1250
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
## 2019-01-12 NOTE — Progress Notes (Signed)
PROGRESS NOTE    Lance Abbott  ZOX:[id number] DOB: [birth year] DOA: 01/10/2019 PCP: Patient, No Pcp Per    Brief Narrative:  38 year old male with a history of hypertension, bipolar disorder, presents with left hand infection.  He is admitted to the hospital for intravenous antibiotics.   Assessment & Plan:   Principal Problem:   Cellulitis of left hand Active Problems:   Bipolar disorder with psychotic features (HCC)   Alcohol use disorder, severe, in early remission (HCC)   Essential hypertension   Substance abuse (HCC)   1. Cellulitis of left hand.  Patient reports that he may have had an insect bite on the right hand and developed subsequent cellulitis.  He was started on vancomycin on admission.  Zosyn was added yesterday.  Seen by orthopedics, appreciate input.  MRI of the left hand reviewed by orthopedics and it was not felt that any abscess was present, just severe swelling.  Continue on IV antibiotics.  Follow-up blood cultures. 2. Hypertension.  Previously on nifedipine.  Currently blood pressure stable.  Continue to monitor. 3. Bipolar disorder/depression.  Continue on home dose of Seroquel, trazodone.  Atarax as needed. 4. Alcohol use disorder.  Reported the last drink was approximate 1 week ago.  Continue on CIWA protocol. 5. Poor dentition/tooth pain.  Patient has seen oral surgery in the past.  He is recommended to follow-up with his oral surgeon on discharge. 6. Cocaine use.  Urine drug screen positive for cocaine.   DVT prophylaxis: Lovenox Code Status: Full code Family Communication: No family present Disposition Plan: Discharge home once cellulitis is improved   Consultants:   Orthopedics  Procedures:     Antimicrobials:   Vancomycin 3/5 >  Zosyn 3/6 >   Subjective: Feels that pain in left hand is mildly better.  Still continues to notice some drainage.  Objective: Vitals:   01/11/19 1800 01/12/19 0034 01/12/19 0530 01/12/19 1200  BP: (!)  133/94 139/84 129/89 138/89  Pulse: 97 (!) 129 84 99  Resp: Temp: 98.5 F (36.9 C) 98.6 F (37 C) 97.8 F (36.6 C)   TempSrc: Oral Oral Oral   SpO2: 98% 97% 97% 97%  Weight:      Height:        Intake/Output Summary (Last 24 hours) at 01/12/2019 1516 Last data filed at 01/12/2019 1300 Gross per 24 hour  Intake 2255.8 ml  Output -  Net 2255.8 ml   Filed Weights   01/10/19 1310 01/10/19 2052  Weight: 104.3 kg 97.7 kg    Examination:  General exam: Appears calm and comfortable  Respiratory system: Clear to auscultation. Respiratory effort normal. Cardiovascular system: S1 & S2 heard, RRR. No JVD, murmurs, rubs, gallops or clicks. No pedal edema. Gastrointestinal system: Abdomen is nondistended, soft and nontender. No organomegaly or masses felt. Normal bowel sounds heard. Central nervous system: Alert and oriented. No focal neurological deficits. Extremities: Overall swelling in left hand is improving.  Continues to have erythema and wound on dorsal aspect of hand. Psychiatry: Judgement and insight appear normal. Mood & affect appropriate.     Data Reviewed: I have personally reviewed following labs and imaging studies  CBC: Recent Labs  Lab 01/10/19 1344 01/11/19 0638  WBC 14.6* 11.1*  NEUTROABS 10.6*  --   HGB 14.8 14.0  HCT 45.3 42.6  MCV 89.5 92.2  PLT 378 344   Basic Metabolic Panel: Recent Labs  Lab 01/10/19 1344 01/11/19 0638  NA 134* 138  K  4.9 4.1  CL 103 108  CO2 18* 23  GLUCOSE 118* 105*  BUN 15 13  CREATININE 1.20 1.03  CALCIUM 9.7 9.1   GFR: Estimated Creatinine Clearance: 115.1 mL/min (by C-G formula based on SCr of 1.03 mg/dL). Liver Function Tests: Recent Labs  Lab 01/10/19 1344  AST 30  ALT 34  ALKPHOS 127*  BILITOT 0.8  PROT 8.6*  ALBUMIN 5.1*   No results for input(s): LIPASE, AMYLASE in the last 168 hours. No results for input(s): AMMONIA in the last 168 hours. Coagulation Profile: No results for input(s): INR,  PROTIME in the last 168 hours. Cardiac Enzymes: No results for input(s): CKTOTAL, CKMB, CKMBINDEX, TROPONINI in the last 168 hours. BNP (last 3 results) No results for input(s): PROBNP in the last 8760 hours. HbA1C: No results for input(s): HGBA1C in the last 72 hours. CBG: No results for input(s): GLUCAP in the last 168 hours. Lipid Profile: No results for input(s): CHOL, HDL, LDLCALC, TRIG, CHOLHDL, LDLDIRECT in the last 72 hours. Thyroid Function Tests: No results for input(s): TSH, T4TOTAL, FREET4, T3FREE, THYROIDAB in the last 72 hours. Anemia Panel: No results for input(s): VITAMINB12, FOLATE, FERRITIN, TIBC, IRON, RETICCTPCT in the last 72 hours. Sepsis Labs: Recent Labs  Lab 01/10/19 1702  LATICACIDVEN 1.6    Recent Results (from the past 240 hour(s))  Culture, blood (Routine X 2) w Reflex to ID Panel     Status: None (Preliminary result)   Collection Time: 01/10/19  5:07 PM  Result Value Ref Range Status   Specimen Description LEFT ANTECUBITAL  Final   Special Requests   Final    BOTTLES DRAWN AEROBIC AND ANAEROBIC Blood Culture adequate volume   Culture   Final    NO GROWTH 2 DAYS Performed at [facility], [address], [place], [place] [address]    Report Status PENDING  Incomplete  Culture, blood (Routine X 2) w Reflex to ID Panel     Status: None (Preliminary result)   Collection Time: 01/10/19  8:31 PM  Result Value Ref Range Status   Specimen Description BLOOD LEFT ARM  Final   Special Requests   Final    BOTTLES DRAWN AEROBIC AND ANAEROBIC Blood Culture adequate volume   Culture   Final    NO GROWTH 2 DAYS Performed at [facility], [address] [place], [place] [address]    Report Status PENDING  Incomplete         Radiology Studies: Dg Wrist Complete Left  Result Date: 01/10/2019 CLINICAL DATA:  The patient states he felt like a spider bit him in the left posterior wrist on the ulnar side about a week ago, since then he has developed swelling  and pus that he once tried to pop. A few days ago he began having a fever, chills, and worsening pain to the left hand / left wrist. Hx of left wrist fx. EXAM: LEFT WRIST - COMPLETE 3+ VIEW COMPARISON:  None. FINDINGS: No fracture or bone lesion. Joints are normally spaced and aligned. There is no bone resorption to suggest osteomyelitis. There is posterior soft tissue swelling. No radiopaque foreign body. No soft tissue air. IMPRESSION: No fracture, bone lesion, evidence of osteomyelitis or radiopaque foreign body or soft tissue air. Electronically Signed   By: Amie Portland M.D.   On: 01/10/2019 19:56   Mr Wrist Left W Wo Contrast  Result Date: 01/11/2019 CLINICAL DATA:  Hand pain and swelling. Bit on the back of the hand 1  week ago. EXAM: MR OF THE LEFT WRIST WITHOUT AND WITH CONTRAST TECHNIQUE: Multiplanar multisequence MR imaging of the left wrist was performed both before and after the administration of intravenous contrast. CONTRAST:  10 mL Gadavist COMPARISON:  None. FINDINGS: Ligaments: Intact scapholunate and lunotriquetral ligaments. Triangular fibrocartilage: Intact. Tendons: Intact flexor compartment tendons. Mild tendinosis of the extensor carpi ulnaris tendon. Carpal tunnel/median nerve: Normal carpal tunnel. Normal median nerve. Guyon's canal: Normal. Joint/cartilage: No chondral defect.  No joint effusion. Bones/carpal alignment: No acute osseous abnormality. No periosteal reaction or bone destruction. Normal alignment. Other: No fluid collection or hematoma. Severe severe soft tissue edema and enhancement along the dorsal aspect of the wrist and hand most consistent with severe cellulitis. 3 x 5 x 7 mm fluid collection in the subcutaneous fat along the dorsal radial aspect of the wrist which may reflect a small abscess versus necrosis. IMPRESSION: 1. Severe cellulitis along the dorsal aspect of the wrist and hand with a 3 x 5 x 7 mm abscess or area of necrosis along the dorsal radial aspect of  the wrist. 2. Mild tendinosis of the extensor carpi ulnaris tendon. Electronically Signed   By: Elige Ko   On: 01/11/2019 14:43        Scheduled Meds: . enoxaparin (LOVENOX) injection  40 mg Subcutaneous Q24H  . folic acid  1 mg Oral Daily  . LORazepam  0-4 mg Intravenous Q6H   Followed by  . [START ON 01/13/2019] LORazepam  0-4 mg Intravenous Q12H  . multivitamin with minerals  1 tablet Oral Daily  . QUEtiapine  300 mg Oral QHS  . QUEtiapine  50 mg Oral Daily  . thiamine  100 mg Oral Daily   Or  . thiamine  100 mg Intravenous Daily   Continuous Infusions: . piperacillin-tazobactam (ZOSYN)  IV 3.375 g (01/12/19 1318)  . vancomycin 750 mg (01/12/19 0912)     LOS: 1 day    Time spent:    Erick Blinks, MD [facility]   If 7PM-7AM, please contact night-coverage [email or web address]  01/12/2019, 3:16 PM

## 2019-01-13 MED ORDER — CLINDAMYCIN HCL 300 MG PO CAPS: 300.0000 mg | ORAL_CAPSULE | Freq: Four times a day (QID) | ORAL | 0 refills | Status: AC

## 2019-01-13 MED ORDER — IBUPROFEN 200 MG PO TABS: 800.0000 mg | ORAL_TABLET | Freq: Three times a day (TID) | ORAL | 0 refills | Status: DC | PRN

## 2019-01-13 MED ORDER — TRAZODONE HCL 50 MG PO TABS: 50.0000 mg | ORAL_TABLET | Freq: Every evening | ORAL | 0 refills | Status: DC | PRN

## 2019-01-13 MED ORDER — HYDROXYZINE HCL 25 MG PO TABS: 25.0000 mg | ORAL_TABLET | Freq: Three times a day (TID) | ORAL | 0 refills | Status: DC | PRN

## 2019-01-13 MED ORDER — IBUPROFEN 800 MG PO TABS
800.0000 mg | ORAL_TABLET | Freq: Three times a day (TID) | ORAL | Status: DC | PRN
  Administered 2019-01-13: 800 mg via ORAL
  Filled 2019-01-13: qty 1

## 2019-01-13 MED ORDER — QUETIAPINE FUMARATE 50 MG PO TABS: 50.0000 mg | ORAL_TABLET | Freq: Every day | ORAL | 0 refills | Status: DC

## 2019-01-13 MED ORDER — QUETIAPINE FUMARATE 300 MG PO TABS: 300.0000 mg | ORAL_TABLET | Freq: Every day | ORAL | 0 refills | Status: DC

## 2019-01-13 NOTE — Discharge Summary (Signed)
Physician Discharge Summary  Lance Abbott ION:[id number] DOB: Oct 25, 1981 DOA: 01/10/2019  PCP: Patient, No Pcp Per  Admit date: 01/10/2019 Discharge date: 01/13/2019  Admitted From: Home Disposition: Home  Recommendations for Outpatient Follow-up:  1. Follow up with PCP in 1-2 weeks 2. Please obtain BMP/CBC in one week   Discharge Condition: Stable CODE STATUS: Full code Diet recommendation: Regular diet  Brief/Interim Summary: 38 year old male with a history of hypertension, bipolar disorder, substance abuse, admitted to the hospital with left hand infection.  He was admitted to the hospital for intravenous antibiotics.  Discharge Diagnoses:  Principal Problem:   Cellulitis of left hand Active Problems:   Bipolar disorder with psychotic features (HCC)   Alcohol use disorder, severe, in early remission (HCC)   Essential hypertension   Substance abuse (HCC)  1. Cellulitis of left hand.  Patient reports that he may have had an insect bite on the right hand, although he did not see which insect had bit him.  He developed subsequent cellulitis.  He was treated with vancomycin and Zosyn.  He was seen by orthopedics who ordered MRI of the left hand.  Per orthopedics, there was not felt to be any abscess on imaging, rather just severe swelling.  Patient was treated with antibiotics and overall swelling and erythema has significantly improved.  Cultures are shown no growth.  He will be transitioned to doxycycline to complete his course. 2. Hypertension.  History of hypertension in the past was previously on nifedipine.  Blood pressures during this hospitalization have been stable. 3. Bipolar disorder/depression.  Continued on home dose of Seroquel and trazodone.  Continued on Atarax as needed. 4. Substance abuse.  Patient's urine drug screen did test positive for cocaine.  His HIV panel was negative.  He was concerned that he may been exposed to hepatitis C in the past.  This test was ordered and  is currently in process.  Discharge Instructions  Discharge Instructions    Diet - low sodium heart healthy   Complete by:  As directed    Increase activity slowly   Complete by:  As directed      Allergies as of 01/13/2019      Reactions   Cogentin [benztropine] Shortness Of Breath   Zyprexa [olanzapine] Shortness Of Breath   Celexa [citalopram Hydrobromide]    Psychotic episodes-triggers PTSD   Depakote [valproic Acid] Other (See Comments)   Altered mental status   Effexor [venlafaxine] Other (See Comments)   Hallucinations   Geodon [ziprasidone Hcl]    Reaction is unknown   Haldol [haloperidol]    "locks me up"   Hydrochlorothiazide    "death" due to kidney failure/problems   Lasix [furosemide]    "death" due to kidney problems   Lexapro [escitalopram]    Altered mental status-"manic"   Lisinopril    "death" from kidney injury   Lithium Other (See Comments)   Altered mental status   Neurontin [gabapentin] Other (See Comments)   Paradoxical response   Thorazine [chlorpromazine]    "bout killed me"    Topamax [topiramate] Other (See Comments)   Hair Loss   Toradol [ketorolac Tromethamine] Hives   Tramadol Hives      Medication List    STOP taking these medications   fluticasone 44 MCG/ACT inhaler Commonly known as:  FLOVENT HFA   nicotine 21 mg/24hr patch Commonly known as:  NICODERM CQ - dosed in mg/24 hours   NIFEdipine 30 MG 24 hr tablet Commonly known as:  ADALAT CC  sulfamethoxazole-trimethoprim 800-160 MG tablet Commonly known as:  BACTRIM DS,SEPTRA DS     TAKE these medications   clindamycin 300 MG capsule Commonly known as:  Cleocin Take 1 capsule (300 mg total) by mouth 4 (four) times daily for 7 days.   hydrOXYzine 25 MG tablet Commonly known as:  ATARAX/VISTARIL Take 1 tablet (25 mg total) by mouth 3 (three) times daily as needed for anxiety.   ibuprofen 200 MG tablet Commonly known as:  ADVIL,MOTRIN Take 4 tablets (800 mg total) by  mouth 3 (three) times daily as needed for mild pain or moderate pain. What changed:  how much to take   loperamide 2 MG tablet Commonly known as:  IMODIUM A-D Take 2 mg by mouth 4 (four) times daily as needed for diarrhea or loose stools.   NYQUIL MULTI-SYMPTOM PO Take 2 capsules by mouth at bedtime as needed (for runny nose).   QUEtiapine 50 MG tablet Commonly known as:  SEROQUEL Take 1 tablet (50 mg total) by mouth daily. For agitation What changed:    when to take this  reasons to take this   QUEtiapine 300 MG tablet Commonly known as:  SEROQUEL Take 1 tablet (300 mg total) by mouth at bedtime. For mood control What changed:    how much to take  when to take this  additional instructions   traZODone 50 MG tablet Commonly known as:  DESYREL Take 1 tablet (50 mg total) by mouth at bedtime as needed for sleep.       Allergies  Allergen Reactions  . Cogentin [Benztropine] Shortness Of Breath  . Zyprexa [Olanzapine] Shortness Of Breath  . Celexa [Citalopram Hydrobromide]     Psychotic episodes-triggers PTSD  . Depakote [Valproic Acid] Other (See Comments)    Altered mental status  . Effexor [Venlafaxine] Other (See Comments)    Hallucinations  . Geodon [Ziprasidone Hcl]     Reaction is unknown  . Haldol [Haloperidol]     "locks me up"  . Hydrochlorothiazide     "death" due to kidney failure/problems  . Lasix [Furosemide]     "death" due to kidney problems  . Lexapro [Escitalopram]     Altered mental status-"manic"  . Lisinopril     "death" from kidney injury  . Lithium Other (See Comments)    Altered mental status  . Neurontin [Gabapentin] Other (See Comments)    Paradoxical response  . Thorazine [Chlorpromazine]     "bout killed me"   . Topamax [Topiramate] Other (See Comments)    Hair Loss  . Toradol [Ketorolac Tromethamine] Hives  . Tramadol Hives    Consultations:  Orthopedics   Procedures/Studies: Dg Wrist Complete Left  Result Date:  01/10/2019 CLINICAL DATA:  The patient states he felt like a spider bit him in the left posterior wrist on the ulnar side about a week ago, since then he has developed swelling and pus that he once tried to pop. A few days ago he began having a fever, chills, and worsening pain to the left hand / left wrist. Hx of left wrist fx. EXAM: LEFT WRIST - COMPLETE 3+ VIEW COMPARISON:  None. FINDINGS: No fracture or bone lesion. Joints are normally spaced and aligned. There is no bone resorption to suggest osteomyelitis. There is posterior soft tissue swelling. No radiopaque foreign body. No soft tissue air. IMPRESSION: No fracture, bone lesion, evidence of osteomyelitis or radiopaque foreign body or soft tissue air. Electronically Signed   By: Renard Hamperavid  Ormond M.D.  On: 01/10/2019 19:56   Mr Wrist Left W Wo Contrast  Result Date: 01/11/2019 CLINICAL DATA:  Hand pain and swelling. Bit on the back of the hand 1 week ago. EXAM: MR OF THE LEFT WRIST WITHOUT AND WITH CONTRAST TECHNIQUE: Multiplanar multisequence MR imaging of the left wrist was performed both before and after the administration of intravenous contrast. CONTRAST:  10 mL Gadavist COMPARISON:  None. FINDINGS: Ligaments: Intact scapholunate and lunotriquetral ligaments. Triangular fibrocartilage: Intact. Tendons: Intact flexor compartment tendons. Mild tendinosis of the extensor carpi ulnaris tendon. Carpal tunnel/median nerve: Normal carpal tunnel. Normal median nerve. Guyon's canal: Normal. Joint/cartilage: No chondral defect.  No joint effusion. Bones/carpal alignment: No acute osseous abnormality. No periosteal reaction or bone destruction. Normal alignment. Other: No fluid collection or hematoma. Severe severe soft tissue edema and enhancement along the dorsal aspect of the wrist and hand most consistent with severe cellulitis. 3 x 5 x 7 mm fluid collection in the subcutaneous fat along the dorsal radial aspect of the wrist which may reflect a small abscess  versus necrosis. IMPRESSION: 1. Severe cellulitis along the dorsal aspect of the wrist and hand with a 3 x 5 x 7 mm abscess or area of necrosis along the dorsal radial aspect of the wrist. 2. Mild tendinosis of the extensor carpi ulnaris tendon. Electronically Signed   By: Elige Ko   On: 01/11/2019 14:43       Subjective: Feels that pain in left hand is improving.  He still noted some drainage from the wound.  Discharge Exam: Vitals:   01/12/19 1756 01/12/19 2122 01/13/19 0019 01/13/19 0547  BP: (!) 149/82  (!) 142/99 119/89  Pulse: 98  100 80  Resp: 20  15 15   Temp:   98.5 F (36.9 C) 97.7 F (36.5 C)  TempSrc:   Oral Oral  SpO2: 99% 96% 97% 98%  Weight:      Height:        General: Pt is alert, awake, not in acute distress Cardiovascular: RRR, S1/S2 +, no rubs, no gallops Respiratory: CTA bilaterally, no wheezing, no rhonchi Abdominal: Soft, NT, ND, bowel sounds + Extremities: Erythema over left hand and swelling has improved substantially.  Still having some drainage.    The results of significant diagnostics from this hospitalization (including imaging, microbiology, ancillary and laboratory) are listed below for reference.     Microbiology: Recent Results (from the past 240 hour(s))  Culture, blood (Routine X 2) w Reflex to ID Panel     Status: None (Preliminary result)   Collection Time: 01/10/19  5:07 PM  Result Value Ref Range Status   Specimen Description LEFT ANTECUBITAL  Final   Special Requests   Final    BOTTLES DRAWN AEROBIC AND ANAEROBIC Blood Culture adequate volume   Culture   Final    NO GROWTH 3 DAYS Performed at [facility], [address], [place], [place] [address]    Report Status PENDING  Incomplete  Culture, blood (Routine X 2) w Reflex to ID Panel     Status: None (Preliminary result)   Collection Time: 01/10/19  8:31 PM  Result Value Ref Range Status   Specimen Description BLOOD LEFT ARM  Final   Special Requests   Final    BOTTLES  DRAWN AEROBIC AND ANAEROBIC Blood Culture adequate volume   Culture   Final    NO GROWTH 3 DAYS Performed at [facility], [address], [place], [place] [address]    Report Status PENDING  Incomplete     Labs: BNP (last 3 results) No results for input(s): BNP in the last 8760 hours. Basic Metabolic Panel: Recent Labs  Lab 01/10/19 1344 01/11/19 0638  NA 134* 138  K 4.9 4.1  CL 103 108  CO2 18* 23  GLUCOSE 118* 105*  BUN 15 13  CREATININE 1.20 1.03  CALCIUM 9.7 9.1   Liver Function Tests: Recent Labs  Lab 01/10/19 1344  AST 30  ALT 34  ALKPHOS 127*  BILITOT 0.8  PROT 8.6*  ALBUMIN 5.1*   No results for input(s): LIPASE, AMYLASE in the last 168 hours. No results for input(s): AMMONIA in the last 168 hours. CBC: Recent Labs  Lab 01/10/19 1344 01/11/19 0638  WBC 14.6* 11.1*  NEUTROABS 10.6*  --   HGB 14.8 14.0  HCT 45.3 42.6  MCV 89.5 92.2  PLT 378 344   Cardiac Enzymes: No results for input(s): CKTOTAL, CKMB, CKMBINDEX, TROPONINI in the last 168 hours. BNP: Invalid input(s): POCBNP CBG: No results for input(s): GLUCAP in the last 168 hours. D-Dimer No results for input(s): DDIMER in the last 72 hours. Hgb A1c No results for input(s): HGBA1C in the last 72 hours. Lipid Profile No results for input(s): CHOL, HDL, LDLCALC, TRIG, CHOLHDL, LDLDIRECT in the last 72 hours. Thyroid function studies No results for input(s): TSH, T4TOTAL, T3FREE, THYROIDAB in the last 72 hours.  Invalid input(s): FREET3 Anemia work up No results for input(s): VITAMINB12, FOLATE, FERRITIN, TIBC, IRON, RETICCTPCT in the last 72 hours. Urinalysis No results found for: COLORURINE, APPEARANCEUR, LABSPEC, PHURINE, GLUCOSEU, HGBUR, BILIRUBINUR, KETONESUR, PROTEINUR, UROBILINOGEN, NITRITE, LEUKOCYTESUR Sepsis Labs Invalid input(s): PROCALCITONIN,  WBC,  LACTICIDVEN Microbiology Recent Results (from the past 240 hour(s))  Culture, blood (Routine X 2) w Reflex to ID Panel      Status: None (Preliminary result)   Collection Time: 01/10/19  5:07 PM  Result Value Ref Range Status   Specimen Description LEFT ANTECUBITAL  Final   Special Requests   Final    BOTTLES DRAWN AEROBIC AND ANAEROBIC Blood Culture adequate volume   Culture   Final    NO GROWTH 3 DAYS Performed at [facility], [address], [place], [place] [address]    Report Status PENDING  Incomplete  Culture, blood (Routine X 2) w Reflex to ID Panel     Status: None (Preliminary result)   Collection Time: 01/10/19  8:31 PM  Result Value Ref Range Status   Specimen Description BLOOD LEFT ARM  Final   Special Requests   Final    BOTTLES DRAWN AEROBIC AND ANAEROBIC Blood Culture adequate volume   Culture   Final    NO GROWTH 3 DAYS Performed at [facility], [address] [place], [place] [address]    Report Status PENDING  Incomplete     Time coordinating discharge:  SIGNED:   Erick Blinks, MD  [facility] 01/13/2019, 8:40 PM   If 7PM-7AM, please contact night-coverage [email or web address]

## 2019-01-13 NOTE — Progress Notes (Signed)
Pt IV removed, WNL. D/C instructions given to pt. Verbalized understanding. Pt awaiting a ride to transport home.

## 2019-01-14 LAB — HEPATITIS C ANTIBODY: HCV Ab: <0.1 s/co ratio (ref 0.0–0.9)

## 2019-01-15 LAB — CULTURE, BLOOD (ROUTINE X 2)
Culture: NO GROWTH
Culture: NO GROWTH
Special Requests: ADEQUATE
Special Requests: ADEQUATE

## 2020-06-01 ENCOUNTER — Other Ambulatory Visit: Payer: Self-pay

## 2020-06-01 ENCOUNTER — Encounter (HOSPITAL_COMMUNITY): Payer: Self-pay | Admitting: *Deleted

## 2020-06-01 ENCOUNTER — Emergency Department (HOSPITAL_COMMUNITY)
Admission: EM | Admit: 2020-06-01 | Discharge: 2020-06-01 | Disposition: A | Discharge status: Home / Self Care | Payer: Self-pay | Attending: Emergency Medicine | Admitting: Emergency Medicine

## 2020-06-01 DIAGNOSIS — Z79899 Other long term (current) drug therapy: Secondary | ICD-10-CM | POA: Insufficient documentation

## 2020-06-01 DIAGNOSIS — L03116 Cellulitis of left lower limb: Secondary | ICD-10-CM | POA: Insufficient documentation

## 2020-06-01 DIAGNOSIS — I10 Essential (primary) hypertension: Secondary | ICD-10-CM | POA: Insufficient documentation

## 2020-06-01 DIAGNOSIS — F17210 Nicotine dependence, cigarettes, uncomplicated: Secondary | ICD-10-CM | POA: Insufficient documentation

## 2020-06-01 DIAGNOSIS — J45909 Unspecified asthma, uncomplicated: Secondary | ICD-10-CM | POA: Insufficient documentation

## 2020-06-01 DIAGNOSIS — M71062 Abscess of bursa, left knee: Secondary | ICD-10-CM | POA: Insufficient documentation

## 2020-06-01 MED ORDER — ARIPIPRAZOLE 30 MG PO TABS: 30.0000 mg | ORAL_TABLET | Freq: Every day | ORAL | 0 refills | Status: DC

## 2020-06-01 MED ORDER — TIZANIDINE HCL 4 MG PO CAPS: 4.0000 mg | ORAL_CAPSULE | Freq: Three times a day (TID) | ORAL | 0 refills | Status: DC | PRN

## 2020-06-01 MED ORDER — IBUPROFEN 100 MG/5ML PO SUSP
600.0000 mg | Freq: Once | ORAL | Status: AC
  Administered 2020-06-01: 600 mg via ORAL
  Filled 2020-06-01: qty 30

## 2020-06-01 MED ORDER — DOXYCYCLINE HYCLATE 100 MG PO CAPS: 100.0000 mg | ORAL_CAPSULE | Freq: Two times a day (BID) | ORAL | 0 refills | Status: DC

## 2020-06-01 NOTE — ED Triage Notes (Signed)
Pt has a red raised bump to left knee with red area all around the bump; pt states it started out as a small white bump x 3 days ago and he popped it and it has gotten progressively worse

## 2020-06-01 NOTE — Discharge Instructions (Signed)
Take Doxycycline twice a day for one week for skin infection Please return if worsening  Follow up with your psychiatrist or a primary doctor for refills on you medicines
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
## 2020-06-23 ENCOUNTER — Other Ambulatory Visit: Payer: Self-pay

## 2020-06-23 ENCOUNTER — Encounter (HOSPITAL_COMMUNITY): Payer: Self-pay | Admitting: Emergency Medicine

## 2020-06-23 ENCOUNTER — Emergency Department (HOSPITAL_COMMUNITY)
Admission: EM | Admit: 2020-06-23 | Discharge: 2020-06-24 | Disposition: A | Discharge status: Home / Self Care | Payer: Self-pay | Attending: Emergency Medicine | Admitting: Emergency Medicine

## 2020-06-23 DIAGNOSIS — R202 Paresthesia of skin: Secondary | ICD-10-CM | POA: Insufficient documentation

## 2020-06-23 DIAGNOSIS — F1910 Other psychoactive substance abuse, uncomplicated: Secondary | ICD-10-CM

## 2020-06-23 DIAGNOSIS — R079 Chest pain, unspecified: Secondary | ICD-10-CM

## 2020-06-23 DIAGNOSIS — R0789 Other chest pain: Secondary | ICD-10-CM | POA: Insufficient documentation

## 2020-06-23 DIAGNOSIS — F10230 Alcohol dependence with withdrawal, uncomplicated: Secondary | ICD-10-CM

## 2020-06-23 DIAGNOSIS — Z79899 Other long term (current) drug therapy: Secondary | ICD-10-CM | POA: Insufficient documentation

## 2020-06-23 DIAGNOSIS — F10930 Alcohol use, unspecified with withdrawal, uncomplicated: Secondary | ICD-10-CM

## 2020-06-23 DIAGNOSIS — K0889 Other specified disorders of teeth and supporting structures: Secondary | ICD-10-CM | POA: Insufficient documentation

## 2020-06-23 DIAGNOSIS — F17210 Nicotine dependence, cigarettes, uncomplicated: Secondary | ICD-10-CM | POA: Insufficient documentation

## 2020-06-23 NOTE — ED Triage Notes (Signed)
Pt here c/o chest pain, bilateral arm numbness, and blurred vision. States it all started about 30 mins ago.

## 2020-06-24 ENCOUNTER — Emergency Department (HOSPITAL_COMMUNITY): Payer: Self-pay

## 2020-06-24 LAB — CBC WITH DIFFERENTIAL/PLATELET
Abs Immature Granulocytes: 0.06 10*3/uL (ref 0.00–0.07)
Basophils Absolute: 0.1 10*3/uL (ref 0.0–0.1)
Basophils Relative: 1 %
Eosinophils Absolute: 0.1 10*3/uL (ref 0.0–0.5)
Eosinophils Relative: 0 %
HCT: 48.7 % (ref 39.0–52.0)
Hemoglobin: 16.4 g/dL (ref 13.0–17.0)
Immature Granulocytes: 0 %
Lymphocytes Relative: 23 %
Lymphs Abs: 3.3 10*3/uL (ref 0.7–4.0)
MCH: 31.1 pg (ref 26.0–34.0)
MCHC: 33.7 g/dL (ref 30.0–36.0)
MCV: 92.4 fL (ref 80.0–100.0)
Monocytes Absolute: 1.3 10*3/uL — ABNORMAL HIGH (ref 0.1–1.0)
Monocytes Relative: 9 %
Neutro Abs: 9.7 10*3/uL — ABNORMAL HIGH (ref 1.7–7.7)
Neutrophils Relative %: 67 %
Platelets: 411 10*3/uL — ABNORMAL HIGH (ref 150–400)
RBC: 5.27 MIL/uL (ref 4.22–5.81)
RDW: 13.4 % (ref 11.5–15.5)
WBC: 14.5 10*3/uL — ABNORMAL HIGH (ref 4.0–10.5)
nRBC: 0 % (ref 0.0–0.2)

## 2020-06-24 LAB — RAPID URINE DRUG SCREEN, HOSP PERFORMED
Amphetamines: POSITIVE — AB
Barbiturates: NOT DETECTED
Benzodiazepines: NOT DETECTED
Cocaine: POSITIVE — AB
Opiates: NOT DETECTED
Tetrahydrocannabinol: POSITIVE — AB

## 2020-06-24 LAB — BASIC METABOLIC PANEL
Anion gap: 15 (ref 5–15)
BUN: 12 mg/dL (ref 6–20)
CO2: 24 mmol/L (ref 22–32)
Calcium: 9.9 mg/dL (ref 8.9–10.3)
Chloride: 99 mmol/L (ref 98–111)
Creatinine, Ser: 1.05 mg/dL (ref 0.61–1.24)
GFR calc Af Amer: >60 mL/min (ref >60)
GFR calc non Af Amer: >60 mL/min (ref >60)
Glucose, Bld: 96 mg/dL (ref 70–99)
Potassium: 3.4 mmol/L — ABNORMAL LOW (ref 3.5–5.1)
Sodium: 138 mmol/L (ref 135–145)

## 2020-06-24 LAB — ETHANOL: Alcohol, Ethyl (B): 10 mg/dL — ABNORMAL HIGH (ref <10)

## 2020-06-24 LAB — TROPONIN I (HIGH SENSITIVITY)
Troponin I (High Sensitivity): 8 ng/L (ref <18)
Troponin I (High Sensitivity): 8 ng/L (ref <18)

## 2020-06-24 MED ORDER — CHLORDIAZEPOXIDE HCL 25 MG PO CAPS
ORAL_CAPSULE | ORAL | 0 refills | Status: DC
Start: 2020-06-24 — End: 2020-08-02

## 2020-06-24 MED ORDER — AMOXICILLIN 500 MG PO CAPS
500.0000 mg | ORAL_CAPSULE | Freq: Three times a day (TID) | ORAL | 0 refills | Status: DC
Start: 2020-06-24 — End: 2020-06-29

## 2020-06-24 MED ORDER — THIAMINE HCL 100 MG PO TABS: 100.0000 mg | ORAL_TABLET | Freq: Every day | ORAL | Status: DC

## 2020-06-24 MED ORDER — THIAMINE HCL 100 MG/ML IJ SOLN: 100.0000 mg | Freq: Every day | INTRAMUSCULAR | Status: DC

## 2020-06-24 MED ORDER — SODIUM CHLORIDE 0.9 % IV BOLUS
1000.0000 mL | Freq: Once | INTRAVENOUS | Status: AC
  Administered 2020-06-24: 1000 mL via INTRAVENOUS

## 2020-06-24 MED ORDER — LORAZEPAM 2 MG/ML IJ SOLN: 0.0000 mg | Freq: Two times a day (BID) | INTRAMUSCULAR | Status: DC

## 2020-06-24 MED ORDER — HYDROCODONE-ACETAMINOPHEN 5-325 MG PO TABS
1.0000 | ORAL_TABLET | Freq: Once | ORAL | Status: AC
  Administered 2020-06-24: 1 via ORAL
  Filled 2020-06-24: qty 1

## 2020-06-24 MED ORDER — NICOTINE 21 MG/24HR TD PT24
21.0000 mg | MEDICATED_PATCH | Freq: Once | TRANSDERMAL | Status: DC
  Administered 2020-06-24: 21 mg via TRANSDERMAL
  Filled 2020-06-24: qty 1

## 2020-06-24 MED ORDER — AMOXICILLIN 250 MG PO CAPS
500.0000 mg | ORAL_CAPSULE | Freq: Once | ORAL | Status: AC
  Administered 2020-06-24: 500 mg via ORAL
  Filled 2020-06-24: qty 2

## 2020-06-24 MED ORDER — LORAZEPAM 1 MG PO TABS: 0.0000 mg | ORAL_TABLET | Freq: Four times a day (QID) | ORAL | Status: DC

## 2020-06-24 MED ORDER — LORAZEPAM 2 MG/ML IJ SOLN
2.0000 mg | Freq: Once | INTRAMUSCULAR | Status: AC
  Administered 2020-06-24: 2 mg via INTRAVENOUS
  Filled 2020-06-24: qty 1

## 2020-06-24 MED ORDER — LORAZEPAM 2 MG/ML IJ SOLN: 0.0000 mg | Freq: Four times a day (QID) | INTRAMUSCULAR | Status: DC

## 2020-06-24 MED ORDER — LORAZEPAM 1 MG PO TABS: 0.0000 mg | ORAL_TABLET | Freq: Two times a day (BID) | ORAL | Status: DC

## 2020-06-24 MED ORDER — LORAZEPAM 1 MG PO TABS: 1.0000 mg | ORAL_TABLET | Freq: Once | ORAL | Status: DC
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
## 2020-06-24 NOTE — ED Notes (Signed)
Pt presents with dilated pupils, and finally admits to using cocaine tonight.

## 2020-06-28 ENCOUNTER — Emergency Department (HOSPITAL_COMMUNITY)
Admission: EM | Admit: 2020-06-28 | Discharge: 2020-06-29 | Disposition: A | Discharge status: Home / Self Care | Payer: Self-pay | Attending: Emergency Medicine | Admitting: Emergency Medicine

## 2020-06-28 ENCOUNTER — Other Ambulatory Visit: Payer: Self-pay

## 2020-06-28 DIAGNOSIS — I10 Essential (primary) hypertension: Secondary | ICD-10-CM | POA: Insufficient documentation

## 2020-06-28 DIAGNOSIS — F3164 Bipolar disorder, current episode mixed, severe, with psychotic features: Secondary | ICD-10-CM | POA: Insufficient documentation

## 2020-06-28 DIAGNOSIS — Z20822 Contact with and (suspected) exposure to covid-19: Secondary | ICD-10-CM | POA: Insufficient documentation

## 2020-06-28 DIAGNOSIS — F1590 Other stimulant use, unspecified, uncomplicated: Secondary | ICD-10-CM | POA: Insufficient documentation

## 2020-06-28 DIAGNOSIS — R443 Hallucinations, unspecified: Secondary | ICD-10-CM | POA: Insufficient documentation

## 2020-06-28 DIAGNOSIS — F1020 Alcohol dependence, uncomplicated: Secondary | ICD-10-CM | POA: Insufficient documentation

## 2020-06-28 DIAGNOSIS — R45851 Suicidal ideations: Secondary | ICD-10-CM

## 2020-06-28 DIAGNOSIS — J45909 Unspecified asthma, uncomplicated: Secondary | ICD-10-CM | POA: Insufficient documentation

## 2020-06-28 DIAGNOSIS — F17210 Nicotine dependence, cigarettes, uncomplicated: Secondary | ICD-10-CM | POA: Insufficient documentation

## 2020-06-28 DIAGNOSIS — Z79899 Other long term (current) drug therapy: Secondary | ICD-10-CM | POA: Insufficient documentation

## 2020-06-28 LAB — COMPREHENSIVE METABOLIC PANEL
ALT: 26 U/L (ref 0–44)
AST: 25 U/L (ref 15–41)
Albumin: 4.6 g/dL (ref 3.5–5.0)
Alkaline Phosphatase: 79 U/L (ref 38–126)
Anion gap: 12 (ref 5–15)
BUN: 11 mg/dL (ref 6–20)
CO2: 28 mmol/L (ref 22–32)
Calcium: 9.6 mg/dL (ref 8.9–10.3)
Chloride: 99 mmol/L (ref 98–111)
Creatinine, Ser: 0.71 mg/dL (ref 0.61–1.24)
GFR calc Af Amer: >60 mL/min (ref >60)
GFR calc non Af Amer: >60 mL/min (ref >60)
Glucose, Bld: 95 mg/dL (ref 70–99)
Potassium: 3.8 mmol/L (ref 3.5–5.1)
Sodium: 139 mmol/L (ref 135–145)
Total Bilirubin: 0.4 mg/dL (ref 0.3–1.2)
Total Protein: 7.8 g/dL (ref 6.5–8.1)

## 2020-06-28 LAB — CBC WITH DIFFERENTIAL/PLATELET
Abs Immature Granulocytes: 0.05 10*3/uL (ref 0.00–0.07)
Basophils Absolute: 0.1 10*3/uL (ref 0.0–0.1)
Basophils Relative: 1 %
Eosinophils Absolute: 0.2 10*3/uL (ref 0.0–0.5)
Eosinophils Relative: 2 %
HCT: 47 % (ref 39.0–52.0)
Hemoglobin: 15.4 g/dL (ref 13.0–17.0)
Immature Granulocytes: 1 %
Lymphocytes Relative: 28 %
Lymphs Abs: 3 10*3/uL (ref 0.7–4.0)
MCH: 31.3 pg (ref 26.0–34.0)
MCHC: 32.8 g/dL (ref 30.0–36.0)
MCV: 95.5 fL (ref 80.0–100.0)
Monocytes Absolute: 0.7 10*3/uL (ref 0.1–1.0)
Monocytes Relative: 7 %
Neutro Abs: 6.6 10*3/uL (ref 1.7–7.7)
Neutrophils Relative %: 61 %
Platelets: 368 10*3/uL (ref 150–400)
RBC: 4.92 MIL/uL (ref 4.22–5.81)
RDW: 13.2 % (ref 11.5–15.5)
WBC: 10.7 10*3/uL — ABNORMAL HIGH (ref 4.0–10.5)
nRBC: 0 % (ref 0.0–0.2)

## 2020-06-28 LAB — ETHANOL: Alcohol, Ethyl (B): <10 mg/dL (ref <10)

## 2020-06-28 MED ORDER — LORAZEPAM 1 MG PO TABS
1.0000 mg | ORAL_TABLET | Freq: Once | ORAL | Status: AC
  Administered 2020-06-29: 1 mg via ORAL
  Filled 2020-06-28: qty 1

## 2020-06-28 NOTE — ED Triage Notes (Signed)
Pt arrives from home via POV c/o SI and auditory hallucinations and reports being off psyche meds for a while and is currently withdrawing from ETOH. Pt reports last drink was apprx at 1500 today. Pt does not endorse a plan to take his life at this time but does report burning self in hand with cigarettes.
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
## 2020-06-29 ENCOUNTER — Encounter (HOSPITAL_COMMUNITY): Payer: Self-pay | Admitting: Registered Nurse

## 2020-06-29 LAB — RAPID URINE DRUG SCREEN, HOSP PERFORMED
Amphetamines: NOT DETECTED
Barbiturates: NOT DETECTED
Benzodiazepines: NOT DETECTED
Cocaine: NOT DETECTED
Opiates: NOT DETECTED
Tetrahydrocannabinol: POSITIVE — AB

## 2020-06-29 LAB — SARS CORONAVIRUS 2 BY RT PCR (HOSPITAL ORDER, PERFORMED IN ~~LOC~~ HOSPITAL LAB): SARS Coronavirus 2: NEGATIVE

## 2020-06-29 MED ORDER — CIPROFLOXACIN-DEXAMETHASONE 0.3-0.1 % OT SUSP
4.0000 [drp] | Freq: Two times a day (BID) | OTIC | Status: DC
  Administered 2020-06-29: 4 [drp] via OTIC
  Filled 2020-06-29 (×2): qty 7.5

## 2020-06-29 MED ORDER — LORAZEPAM 1 MG PO TABS
0.0000 mg | ORAL_TABLET | Freq: Four times a day (QID) | ORAL | Status: DC
  Administered 2020-06-29 (×2): 1 mg via ORAL
  Filled 2020-06-29 (×2): qty 1

## 2020-06-29 MED ORDER — BUSPIRONE HCL 10 MG PO TABS: ORAL_TABLET | ORAL | 0 refills | Status: DC

## 2020-06-29 MED ORDER — NICOTINE 14 MG/24HR TD PT24
14.0000 mg | MEDICATED_PATCH | Freq: Once | TRANSDERMAL | Status: DC
  Administered 2020-06-29: 14 mg via TRANSDERMAL

## 2020-06-29 MED ORDER — CIPROFLOXACIN-DEXAMETHASONE 0.3-0.1 % OT SUSP: OTIC | 0 refills | Status: DC

## 2020-06-29 MED ORDER — LORAZEPAM 2 MG/ML IJ SOLN: 0.0000 mg | Freq: Two times a day (BID) | INTRAMUSCULAR | Status: DC

## 2020-06-29 MED ORDER — LORATADINE 10 MG PO TABS
10.0000 mg | ORAL_TABLET | Freq: Every day | ORAL | Status: DC
  Administered 2020-06-29: 10 mg via ORAL
  Filled 2020-06-29: qty 1

## 2020-06-29 MED ORDER — THIAMINE HCL 100 MG/ML IJ SOLN: 100.0000 mg | Freq: Every day | INTRAMUSCULAR | Status: DC

## 2020-06-29 MED ORDER — TRAZODONE HCL 50 MG PO TABS: 50.0000 mg | ORAL_TABLET | Freq: Every evening | ORAL | 0 refills | Status: DC | PRN

## 2020-06-29 MED ORDER — ACETAMINOPHEN 325 MG PO TABS
650.0000 mg | ORAL_TABLET | Freq: Once | ORAL | Status: AC
  Administered 2020-06-29: 650 mg via ORAL
  Filled 2020-06-29: qty 2

## 2020-06-29 MED ORDER — THIAMINE HCL 100 MG PO TABS
100.0000 mg | ORAL_TABLET | Freq: Every day | ORAL | Status: DC
  Administered 2020-06-29: 100 mg via ORAL
  Filled 2020-06-29: qty 1

## 2020-06-29 MED ORDER — AMOXICILLIN 250 MG PO CAPS
500.0000 mg | ORAL_CAPSULE | Freq: Three times a day (TID) | ORAL | Status: DC
  Administered 2020-06-29 (×2): 500 mg via ORAL
  Filled 2020-06-29 (×3): qty 2

## 2020-06-29 MED ORDER — AMOXICILLIN 500 MG PO CAPS: 500.0000 mg | ORAL_CAPSULE | Freq: Three times a day (TID) | ORAL | 0 refills | Status: DC

## 2020-06-29 MED ORDER — ARIPIPRAZOLE ER 400 MG IM SRER: 400.0000 mg | Freq: Once | INTRAMUSCULAR | Status: DC

## 2020-06-29 MED ORDER — LORAZEPAM 1 MG PO TABS: 0.0000 mg | ORAL_TABLET | Freq: Two times a day (BID) | ORAL | Status: DC

## 2020-06-29 MED ORDER — ARIPIPRAZOLE 10 MG PO TABS: 10.0000 mg | ORAL_TABLET | Freq: Every day | ORAL | 0 refills | Status: DC

## 2020-06-29 MED ORDER — LORAZEPAM 2 MG/ML IJ SOLN: 0.0000 mg | Freq: Four times a day (QID) | INTRAMUSCULAR | Status: DC

## 2020-06-29 MED ORDER — ARIPIPRAZOLE 5 MG PO TABS
10.0000 mg | ORAL_TABLET | Freq: Every day | ORAL | Status: DC
  Administered 2020-06-29: 10 mg via ORAL
  Filled 2020-06-29: qty 2

## 2020-06-29 NOTE — Progress Notes (Addendum)
Pt has been psychiatrically cleared and is interested residential treatment. CSW contacted [facility], who have male residential beds available today. CSW spoke with pt's RN at [facility] ED. Pt will call [phone] to complete phone screening with [facility] and CSW will fax referral information to the agency.   Disposition will continue to follow.     Wells Guiles, LCSW, LCAS Disposition CSW [facility]/TTS [phone]
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
## 2020-06-29 NOTE — ED Provider Notes (Signed)
I was asked to see this patient after he started complaining of right-sided ear pain, on my exam the patient does have some pain with manipulation of the auricle however the tympanic membrane is totally clear, there is no desquamation or discharge in the ear canal.  No trismus or torticollis.  He also has what appears to be multiple teeth in poor repair, he has lost a crown in his left lower molar and has multiple degraded teeth throughout his mouth.  There is no obvious gingival swelling, phonation is normal, no lymphadenopathy of the neck.  Patient needs antibiotic for likely developing dental abscess given loss of crown as well as topical antibiotics for likely otitis externa of the right ear.  Otherwise well-appearing   Lance Hong, MD 06/29/20 [phone]

## 2020-06-29 NOTE — Progress Notes (Signed)
Pt meets inpatient criteria per Mikki Santee, NP. Referral information has been sent to the following hospitals for review:  [facility]  [facility]  [facility]  [facility]  [facility]  [facility]  [facility]  [facility]  [facility]  [facility]  [facility]  [facility]  [facility]  [facility]  [facility]  [facility]     Disposition will continue to assist with inpatient placement needs.    Wells Guiles, LCSW, LCAS Disposition CSW [facility]/TTS [phone]

## 2020-06-29 NOTE — Consult Note (Signed)
  Tele psych Assessment   Brannen Koppen, 39 y.o., male patient seen via tele psych by TTS and this provider; chart reviewed and consulted with Dr. Jola Babinski on 06/29/20.  On evaluation Lance Abbott first reports that he came to the hospital because he has been off of his medications for a month and needs his Abilify Maintena.  Stated that he was having suicidal thoughts and auditory hallucinations.  After speaking with patient for a while patient admitted to polysubstance use and just wanting to get back on his medications and detox from alcohol so that he could get himself together and find a job.  States that he recently moved here from [place] in with his father and has not outpatient services set up and no medications.  States he was taking Abilify, Trazodone Buspar.    During evaluation Sherrod Toothman is sitting up in chair; he is alert/oriented x 4; calm/cooperative; and mood congruent with affect.  Patient is speaking in a clear tone at moderate volume, and normal pace; with good eye contact.  His thought process is coherent and relevant; There is no indication that he is currently responding to internal/external stimuli or experiencing delusional thought content.  Patient denies suicidal/self-harm/homicidal ideation, psychosis, and paranoia.  Patient has remained calm throughout assessment and has answered questions appropriately.  Patient wanting to restart home medications, resources/referral to outpatient psychiatric provider, and detox from alcohol.      For detailed note see TTS tele assessment note  Recommendations:  Social working is referring patient to detox facilities, will also need to set up with outpatient psychiatric services.  Patient to be given Abilify Maintena proro to discharge.  Medication Management:  Patient will also need prescriptions for the following medication highlighted. Buspar 10 mg Tid for anxiety Abilify 10 mg daily for mood stability (Continue for 14 day) after first dose of  Abilify Maintena Trazodone 50 mg Q hs for sleep Abilify Maintena 400 mg Monthly  Disposition:  Patient is psychiatrically cleared  No evidence of imminent risk to self or others at present.   Patient does not meet criteria for psychiatric inpatient admission. Supportive therapy provided about ongoing stressors. Discussed crisis plan, support from social network, calling 911, coming to the Emergency Department, and calling Suicide Hotline.  Assunta Found, NP

## 2020-06-29 NOTE — Discharge Instructions (Signed)
Follow-up as instructed by behavioral health and follow-up with a dentist

## 2020-06-29 NOTE — ED Notes (Signed)
Pt given diet shasta upon request

## 2020-06-29 NOTE — ED Notes (Signed)
Pt was given meal tray.  

## 2020-06-29 NOTE — ED Notes (Signed)
Pt speaking with staff from [facility] in [place], [phone]

## 2020-06-29 NOTE — ED Notes (Signed)
Sitter Genevive Bi to Family rm with Pt for TTS consult.

## 2020-07-05 ENCOUNTER — Ambulatory Visit (HOSPITAL_COMMUNITY)
Admission: EM | Admit: 2020-07-05 | Discharge: 2020-07-05 | Disposition: A | Discharge status: Home / Self Care | Payer: No Payment, Other | Attending: Psychiatry | Admitting: Psychiatry

## 2020-07-05 ENCOUNTER — Other Ambulatory Visit: Payer: Self-pay

## 2020-07-05 DIAGNOSIS — F1021 Alcohol dependence, in remission: Secondary | ICD-10-CM | POA: Diagnosis not present

## 2020-07-05 DIAGNOSIS — F209 Schizophrenia, unspecified: Secondary | ICD-10-CM | POA: Insufficient documentation

## 2020-07-05 DIAGNOSIS — Z8659 Personal history of other mental and behavioral disorders: Secondary | ICD-10-CM | POA: Insufficient documentation

## 2020-07-05 DIAGNOSIS — F1111 Opioid abuse, in remission: Secondary | ICD-10-CM | POA: Insufficient documentation

## 2020-07-05 DIAGNOSIS — F329 Major depressive disorder, single episode, unspecified: Secondary | ICD-10-CM | POA: Insufficient documentation

## 2020-07-05 DIAGNOSIS — F1910 Other psychoactive substance abuse, uncomplicated: Secondary | ICD-10-CM | POA: Insufficient documentation

## 2020-07-05 DIAGNOSIS — F419 Anxiety disorder, unspecified: Secondary | ICD-10-CM | POA: Insufficient documentation

## 2020-07-05 DIAGNOSIS — R45851 Suicidal ideations: Secondary | ICD-10-CM | POA: Diagnosis not present

## 2020-07-05 DIAGNOSIS — F302 Manic episode, severe with psychotic symptoms: Secondary | ICD-10-CM | POA: Insufficient documentation

## 2020-07-05 DIAGNOSIS — F1020 Alcohol dependence, uncomplicated: Secondary | ICD-10-CM | POA: Insufficient documentation

## 2020-07-05 NOTE — ED Notes (Signed)
avs went over with pt. Verbalized understanding. Belongings retrieved from locker. Escorted out to front lobby. Stable at time of d/c

## 2020-07-05 NOTE — Discharge Instructions (Signed)
Take all of your medications as prescribed by your mental healthcare provider.  Report any adverse effects and reactions from your medications to your outpatient provider promptly.  Do not engage in alcohol and or illegal drug use while on prescription medicines. Keep all scheduled appointments. This is to ensure that you are getting refills on time and to avoid any interruption in your medication.  If you are unable to keep an appointment call to reschedule.  Be sure to follow up with resources and follow ups given. In the event of worsening symptoms call the crisis hotline, 911, and or go to the nearest emergency department for appropriate evaluation and treatment of symptoms. Follow-up with your primary care provider for your medical issues, concerns and or health care needs.   Follow-up with substance use treatment resources provided on handout.

## 2020-07-05 NOTE — ED Provider Notes (Signed)
Behavioral Health Medical Screening Exam  Lance Abbott is a 39 y.o. male.  Patient arrives voluntarily to [facility] behavioral health center for walk-in assessment.  Patient endorses history of depression, schizophrenia and bipolar disorder.  Patient reports he is currently self-medicating with heroin, alcohol and cocaine.  Patient reports chronic use of alcohol, heroin and cocaine times approximately 3 months.  Patient reports sober x8 months prior to relapse approximately 3 months ago.  Patient reports recent stressors include relocating from [place] to [place] area approximately 1 month ago.  Patient reports he resided in [place] with his mother who evicted him from her home related to his substance use.  Patient reports he resides currently in [place] with his father and his brother.  Patient reports he will be unable to return to father's home if his father is made aware of his heroin and cocaine use.  Patient reports passive suicidal ideation times approximately 1 month.  Patient denies plan or intent to complete suicide.  Patient reports prior suicide attempt has an intentional heroin overdose in 2012.  Patient denies homicidal ideations.  Patient denies auditory visual hallucinations.  There is no indication the patient is responding to internal stimuli and no evidence of delusional thought content.  Patient assessed by nurse practitioner.  Patient alert and oriented, actively participates in assessment.  Patient appropriate and cooperative with assessment.  Patient reports he presented for assessment today as he would like to establish ongoing psychiatric care.  Patient provides verbal consent to allow TTS counselor to speak with patient's father to gain collateral information.   Total Time spent with patient: 30 minutes  Psychiatric Specialty Exam  Presentation  General Appearance:Appropriate for Environment  Eye Contact:Fair  Speech:Clear and Coherent;Normal Rate  Speech  Volume:Normal  Handedness:Right   Mood and Affect  Mood:Anxious  Affect:Appropriate;Congruent   Thought Process  Thought Processes:Coherent;Goal Directed  Descriptions of Associations:Intact  Orientation:Full (Time, Place and Person)  Thought Content:Logical;WDL  Hallucinations:None  Ideas of Reference:None  Suicidal Thoughts:Yes, Passive Without Intent;Without Plan  Homicidal Thoughts:No   Sensorium  Memory:Immediate Good;Recent Good;Remote Good  Judgment:Fair  Insight:Fair   Executive Functions  Concentration:Good  Attention Span:Good  Recall:Good  Fund of Knowledge:Good  Language:Good   Psychomotor Activity  Psychomotor Activity:Normal   Assets  Assets:Communication Skills;Desire for Improvement;Financial Resources/Insurance;Housing;Intimacy;Leisure Time;Physical Health;Resilience;Social Support;Talents/Skills   Sleep  Sleep:Good  Number of hours: 8   Physical Exam: Physical Exam Vitals and nursing note reviewed.  Constitutional:      Appearance: He is well-developed.  HENT:     Head: Normocephalic.  Cardiovascular:     Rate and Rhythm: Normal rate.  Pulmonary:     Effort: Pulmonary effort is normal.  Neurological:     Mental Status: He is alert and oriented to person, place, and time.  Psychiatric:        Attention and Perception: Attention and perception normal.        Mood and Affect: Affect normal. Mood is anxious.        Speech: Speech normal.        Behavior: Behavior normal. Behavior is cooperative.        Thought Content: Thought content includes suicidal ideation.        Cognition and Memory: Cognition normal.        Judgment: Judgment normal.    Review of Systems  Constitutional: Negative.   HENT: Negative.   Eyes: Negative.   Respiratory: Negative.   Cardiovascular: Negative.   Gastrointestinal: Negative.   Genitourinary: Negative.  Musculoskeletal: Negative.   Skin: Negative.   Neurological: Negative.    Endo/Heme/Allergies: Negative.   Psychiatric/Behavioral: Positive for substance abuse and suicidal ideas.   Blood pressure (!) 148/99, pulse (!) 106, temperature 98.1 F (36.7 C), temperature source Oral, resp. rate 18, height 5\' 8"  (1.727 m), weight 164 lb (74.4 kg), SpO2 98 %. Body mass index is 24.94 kg/m.  Musculoskeletal: Strength & Muscle Tone: within normal limits Gait & Station: normal Patient leans: N/A   Recommendations: Patient reviewed with Dr. . Recommend follow-up with [facility] and substance use treatment options provided.  Based on my evaluation the patient does not appear to have an emergency medical condition.  Lucianne Muss, FNP 07/05/2020, 2:44 PM
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
## 2020-07-13 ENCOUNTER — Other Ambulatory Visit: Payer: Self-pay

## 2020-07-13 ENCOUNTER — Emergency Department (HOSPITAL_COMMUNITY)
Admission: EM | Admit: 2020-07-13 | Discharge: 2020-07-13 | Disposition: A | Discharge status: Home / Self Care | Payer: Self-pay | Attending: Emergency Medicine | Admitting: Emergency Medicine

## 2020-07-13 ENCOUNTER — Encounter (HOSPITAL_COMMUNITY): Payer: Self-pay | Admitting: Emergency Medicine

## 2020-07-13 DIAGNOSIS — F17210 Nicotine dependence, cigarettes, uncomplicated: Secondary | ICD-10-CM | POA: Insufficient documentation

## 2020-07-13 DIAGNOSIS — X58XXXA Exposure to other specified factors, initial encounter: Secondary | ICD-10-CM | POA: Insufficient documentation

## 2020-07-13 DIAGNOSIS — Y929 Unspecified place or not applicable: Secondary | ICD-10-CM | POA: Insufficient documentation

## 2020-07-13 DIAGNOSIS — T402X1A Poisoning by other opioids, accidental (unintentional), initial encounter: Secondary | ICD-10-CM | POA: Insufficient documentation

## 2020-07-13 DIAGNOSIS — S60417A Abrasion of left little finger, initial encounter: Secondary | ICD-10-CM | POA: Insufficient documentation

## 2020-07-13 DIAGNOSIS — Y999 Unspecified external cause status: Secondary | ICD-10-CM | POA: Insufficient documentation

## 2020-07-13 DIAGNOSIS — I10 Essential (primary) hypertension: Secondary | ICD-10-CM | POA: Insufficient documentation

## 2020-07-13 DIAGNOSIS — T40601A Poisoning by unspecified narcotics, accidental (unintentional), initial encounter: Secondary | ICD-10-CM

## 2020-07-13 DIAGNOSIS — J45909 Unspecified asthma, uncomplicated: Secondary | ICD-10-CM | POA: Insufficient documentation

## 2020-07-13 DIAGNOSIS — Z79899 Other long term (current) drug therapy: Secondary | ICD-10-CM | POA: Insufficient documentation

## 2020-07-13 DIAGNOSIS — Y939 Activity, unspecified: Secondary | ICD-10-CM | POA: Insufficient documentation

## 2020-07-13 MED ORDER — ONDANSETRON 8 MG PO TBDP
8.0000 mg | ORAL_TABLET | Freq: Once | ORAL | Status: AC
  Administered 2020-07-13: 8 mg via ORAL
  Filled 2020-07-13: qty 1
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
## 2020-07-13 NOTE — ED Notes (Signed)
Patient left without paper work and stated that he was going outside to smoke and that he did not have a ride home.

## 2020-07-13 NOTE — ED Notes (Signed)
Dr. Lynelle Doctor ask patient if he intentionally attempted to harm himself and patient denied intentionally wanting to harm himself and patient denied SI. Patient stated SI intent to EMS prior.

## 2020-07-13 NOTE — Discharge Instructions (Addendum)
You need to get help for your addiction.  Look at the resource guide and get an appointment to get help.
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
## 2020-07-25 ENCOUNTER — Emergency Department (HOSPITAL_COMMUNITY): Payer: Self-pay

## 2020-07-25 ENCOUNTER — Encounter (HOSPITAL_COMMUNITY): Payer: Self-pay | Admitting: Emergency Medicine

## 2020-07-25 ENCOUNTER — Emergency Department (HOSPITAL_COMMUNITY)
Admission: EM | Admit: 2020-07-25 | Discharge: 2020-07-25 | Discharge status: Against Medical Advice | Payer: Self-pay | Attending: Emergency Medicine | Admitting: Emergency Medicine

## 2020-07-25 ENCOUNTER — Other Ambulatory Visit: Payer: Self-pay

## 2020-07-25 DIAGNOSIS — Y9389 Activity, other specified: Secondary | ICD-10-CM | POA: Insufficient documentation

## 2020-07-25 DIAGNOSIS — S90411A Abrasion, right great toe, initial encounter: Secondary | ICD-10-CM | POA: Insufficient documentation

## 2020-07-25 DIAGNOSIS — S99921A Unspecified injury of right foot, initial encounter: Secondary | ICD-10-CM

## 2020-07-25 DIAGNOSIS — I10 Essential (primary) hypertension: Secondary | ICD-10-CM | POA: Insufficient documentation

## 2020-07-25 DIAGNOSIS — Y9289 Other specified places as the place of occurrence of the external cause: Secondary | ICD-10-CM | POA: Insufficient documentation

## 2020-07-25 DIAGNOSIS — W19XXXA Unspecified fall, initial encounter: Secondary | ICD-10-CM | POA: Insufficient documentation

## 2020-07-25 DIAGNOSIS — F17210 Nicotine dependence, cigarettes, uncomplicated: Secondary | ICD-10-CM | POA: Insufficient documentation

## 2020-07-25 DIAGNOSIS — J45909 Unspecified asthma, uncomplicated: Secondary | ICD-10-CM | POA: Insufficient documentation

## 2020-07-25 NOTE — ED Notes (Signed)
Cleaned right great toe, toenail intact, abrasion noted to tip of right great toe
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
## 2020-07-26 ENCOUNTER — Other Ambulatory Visit: Payer: Self-pay

## 2020-07-26 ENCOUNTER — Ambulatory Visit (HOSPITAL_COMMUNITY)
Admission: AD | Admit: 2020-07-26 | Discharge: 2020-07-26 | Disposition: A | Discharge status: Home / Self Care | Payer: Self-pay | Attending: Psychiatry | Admitting: Psychiatry

## 2020-07-26 ENCOUNTER — Ambulatory Visit (HOSPITAL_COMMUNITY)
Admission: EM | Admit: 2020-07-26 | Discharge: 2020-07-28 | Disposition: A | Discharge status: Other Acute Inpatient Hospital | Payer: No Payment, Other | Attending: Behavioral Health | Admitting: Behavioral Health

## 2020-07-26 DIAGNOSIS — F302 Manic episode, severe with psychotic symptoms: Secondary | ICD-10-CM | POA: Insufficient documentation

## 2020-07-26 DIAGNOSIS — F419 Anxiety disorder, unspecified: Secondary | ICD-10-CM | POA: Insufficient documentation

## 2020-07-26 DIAGNOSIS — Z79899 Other long term (current) drug therapy: Secondary | ICD-10-CM | POA: Insufficient documentation

## 2020-07-26 DIAGNOSIS — F410 Panic disorder [episodic paroxysmal anxiety] without agoraphobia: Secondary | ICD-10-CM | POA: Insufficient documentation

## 2020-07-26 DIAGNOSIS — F1020 Alcohol dependence, uncomplicated: Secondary | ICD-10-CM | POA: Insufficient documentation

## 2020-07-26 DIAGNOSIS — F319 Bipolar disorder, unspecified: Secondary | ICD-10-CM

## 2020-07-26 DIAGNOSIS — K0889 Other specified disorders of teeth and supporting structures: Secondary | ICD-10-CM | POA: Insufficient documentation

## 2020-07-26 DIAGNOSIS — F17210 Nicotine dependence, cigarettes, uncomplicated: Secondary | ICD-10-CM | POA: Insufficient documentation

## 2020-07-26 DIAGNOSIS — H9209 Otalgia, unspecified ear: Secondary | ICD-10-CM | POA: Insufficient documentation

## 2020-07-26 DIAGNOSIS — R45851 Suicidal ideations: Secondary | ICD-10-CM | POA: Insufficient documentation

## 2020-07-26 DIAGNOSIS — F10239 Alcohol dependence with withdrawal, unspecified: Secondary | ICD-10-CM | POA: Insufficient documentation

## 2020-07-26 DIAGNOSIS — F1910 Other psychoactive substance abuse, uncomplicated: Secondary | ICD-10-CM | POA: Insufficient documentation

## 2020-07-26 DIAGNOSIS — R109 Unspecified abdominal pain: Secondary | ICD-10-CM | POA: Insufficient documentation

## 2020-07-26 DIAGNOSIS — R251 Tremor, unspecified: Secondary | ICD-10-CM | POA: Insufficient documentation

## 2020-07-26 DIAGNOSIS — F1190 Opioid use, unspecified, uncomplicated: Secondary | ICD-10-CM | POA: Insufficient documentation

## 2020-07-26 DIAGNOSIS — R5383 Other fatigue: Secondary | ICD-10-CM | POA: Insufficient documentation

## 2020-07-26 DIAGNOSIS — F209 Schizophrenia, unspecified: Secondary | ICD-10-CM | POA: Insufficient documentation

## 2020-07-26 DIAGNOSIS — Z20822 Contact with and (suspected) exposure to covid-19: Secondary | ICD-10-CM | POA: Insufficient documentation

## 2020-07-26 DIAGNOSIS — F1120 Opioid dependence, uncomplicated: Secondary | ICD-10-CM | POA: Insufficient documentation

## 2020-07-26 LAB — POCT URINE DRUG SCREEN - MANUAL ENTRY (I-SCREEN)
POC Amphetamine UR: NOT DETECTED
POC Buprenorphine (BUP): NOT DETECTED
POC Cocaine UR: NOT DETECTED
POC Marijuana UR: NOT DETECTED
POC Methadone UR: NOT DETECTED
POC Methamphetamine UR: NOT DETECTED
POC Morphine: NOT DETECTED
POC Oxazepam (BZO): POSITIVE — AB
POC Oxycodone UR: NOT DETECTED
POC Secobarbital (BAR): NOT DETECTED

## 2020-07-26 LAB — POC SARS CORONAVIRUS 2 AG -  ED: SARS Coronavirus 2 Ag: NEGATIVE

## 2020-07-26 MED ORDER — METHOCARBAMOL 500 MG PO TABS
500.0000 mg | ORAL_TABLET | Freq: Three times a day (TID) | ORAL | Status: DC | PRN
  Administered 2020-07-27 (×2): 500 mg via ORAL
  Filled 2020-07-26 (×2): qty 1

## 2020-07-26 MED ORDER — DICYCLOMINE HCL 20 MG PO TABS: 20.0000 mg | ORAL_TABLET | Freq: Four times a day (QID) | ORAL | Status: DC | PRN

## 2020-07-26 MED ORDER — LOPERAMIDE HCL 2 MG PO CAPS: 2.0000 mg | ORAL_CAPSULE | ORAL | Status: DC | PRN

## 2020-07-26 MED ORDER — ALUM & MAG HYDROXIDE-SIMETH 200-200-20 MG/5ML PO SUSP: 30.0000 mL | ORAL | Status: DC | PRN

## 2020-07-26 MED ORDER — HYDROXYZINE HCL 25 MG PO TABS: 50.0000 mg | ORAL_TABLET | Freq: Four times a day (QID) | ORAL | Status: DC | PRN

## 2020-07-26 MED ORDER — CLONIDINE HCL 0.1 MG PO TABS
0.1000 mg | ORAL_TABLET | Freq: Four times a day (QID) | ORAL | Status: DC
  Administered 2020-07-27 (×5): 0.1 mg via ORAL
  Filled 2020-07-26 (×5): qty 1

## 2020-07-26 MED ORDER — HYDROXYZINE HCL 25 MG PO TABS
25.0000 mg | ORAL_TABLET | Freq: Four times a day (QID) | ORAL | Status: DC | PRN
  Administered 2020-07-27 (×2): 25 mg via ORAL
  Filled 2020-07-26 (×2): qty 1

## 2020-07-26 MED ORDER — CLONIDINE HCL 0.1 MG PO TABS: 0.1000 mg | ORAL_TABLET | Freq: Every day | ORAL | Status: DC

## 2020-07-26 MED ORDER — MAGNESIUM HYDROXIDE 400 MG/5ML PO SUSP: 30.0000 mL | Freq: Every day | ORAL | Status: DC | PRN

## 2020-07-26 MED ORDER — ACETAMINOPHEN 325 MG PO TABS
650.0000 mg | ORAL_TABLET | Freq: Four times a day (QID) | ORAL | Status: DC | PRN
  Administered 2020-07-27 (×3): 650 mg via ORAL
  Filled 2020-07-26 (×3): qty 2

## 2020-07-26 MED ORDER — CLONIDINE HCL 0.1 MG PO TABS: 0.1000 mg | ORAL_TABLET | ORAL | Status: DC

## 2020-07-26 MED ORDER — ONDANSETRON 4 MG PO TBDP: 4.0000 mg | ORAL_TABLET | Freq: Four times a day (QID) | ORAL | Status: DC | PRN

## 2020-07-26 NOTE — ED Notes (Signed)
Locker #[id number] 
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
## 2020-07-27 ENCOUNTER — Encounter (HOSPITAL_COMMUNITY): Payer: Self-pay | Admitting: Emergency Medicine

## 2020-07-27 ENCOUNTER — Other Ambulatory Visit: Payer: Self-pay

## 2020-07-27 LAB — TSH: TSH: 1.789 u[IU]/mL (ref 0.350–4.500)

## 2020-07-27 LAB — COMPREHENSIVE METABOLIC PANEL
ALT: 24 U/L (ref 0–44)
AST: 28 U/L (ref 15–41)
Albumin: 4.6 g/dL (ref 3.5–5.0)
Alkaline Phosphatase: 86 U/L (ref 38–126)
Anion gap: 15 (ref 5–15)
BUN: 12 mg/dL (ref 6–20)
CO2: 22 mmol/L (ref 22–32)
Calcium: 9.6 mg/dL (ref 8.9–10.3)
Chloride: 103 mmol/L (ref 98–111)
Creatinine, Ser: 0.96 mg/dL (ref 0.61–1.24)
GFR calc Af Amer: >60 mL/min (ref >60)
GFR calc non Af Amer: >60 mL/min (ref >60)
Glucose, Bld: 79 mg/dL (ref 70–99)
Potassium: 3.7 mmol/L (ref 3.5–5.1)
Sodium: 140 mmol/L (ref 135–145)
Total Bilirubin: 0.5 mg/dL (ref 0.3–1.2)
Total Protein: 7.6 g/dL (ref 6.5–8.1)

## 2020-07-27 LAB — CBC WITH DIFFERENTIAL/PLATELET
Abs Immature Granulocytes: 0.03 10*3/uL (ref 0.00–0.07)
Basophils Absolute: 0.1 10*3/uL (ref 0.0–0.1)
Basophils Relative: 1 %
Eosinophils Absolute: 0.1 10*3/uL (ref 0.0–0.5)
Eosinophils Relative: 1 %
HCT: 46.9 % (ref 39.0–52.0)
Hemoglobin: 15.6 g/dL (ref 13.0–17.0)
Immature Granulocytes: 0 %
Lymphocytes Relative: 34 %
Lymphs Abs: 3.6 10*3/uL (ref 0.7–4.0)
MCH: 30.6 pg (ref 26.0–34.0)
MCHC: 33.3 g/dL (ref 30.0–36.0)
MCV: 92.1 fL (ref 80.0–100.0)
Monocytes Absolute: 0.8 10*3/uL (ref 0.1–1.0)
Monocytes Relative: 7 %
Neutro Abs: 6.1 10*3/uL (ref 1.7–7.7)
Neutrophils Relative %: 57 %
Platelets: 422 10*3/uL — ABNORMAL HIGH (ref 150–400)
RBC: 5.09 MIL/uL (ref 4.22–5.81)
RDW: 13.2 % (ref 11.5–15.5)
WBC: 10.7 10*3/uL — ABNORMAL HIGH (ref 4.0–10.5)
nRBC: 0 % (ref 0.0–0.2)

## 2020-07-27 LAB — LIPID PANEL
Cholesterol: 216 mg/dL — ABNORMAL HIGH (ref 0–200)
HDL: 97 mg/dL (ref >40)
LDL Cholesterol: 87 mg/dL (ref 0–99)
Total CHOL/HDL Ratio: 2.2 RATIO
Triglycerides: 162 mg/dL — ABNORMAL HIGH (ref <150)
VLDL: 32 mg/dL (ref 0–40)

## 2020-07-27 LAB — SARS CORONAVIRUS 2 BY RT PCR (HOSPITAL ORDER, PERFORMED IN ~~LOC~~ HOSPITAL LAB): SARS Coronavirus 2: NEGATIVE

## 2020-07-27 LAB — ETHANOL: Alcohol, Ethyl (B): 113 mg/dL — ABNORMAL HIGH (ref <10)

## 2020-07-27 LAB — MAGNESIUM: Magnesium: 2.2 mg/dL (ref 1.7–2.4)

## 2020-07-27 MED ORDER — THIAMINE HCL 100 MG PO TABS: 100.0000 mg | ORAL_TABLET | Freq: Every day | ORAL | Status: DC

## 2020-07-27 MED ORDER — QUETIAPINE FUMARATE 300 MG PO TABS
300.0000 mg | ORAL_TABLET | Freq: Once | ORAL | Status: AC
  Administered 2020-07-27: 300 mg via ORAL
  Filled 2020-07-27: qty 1

## 2020-07-27 MED ORDER — ADULT MULTIVITAMIN W/MINERALS CH
1.0000 | ORAL_TABLET | Freq: Every day | ORAL | Status: DC
  Administered 2020-07-27: 1 via ORAL
  Filled 2020-07-27: qty 1

## 2020-07-27 MED ORDER — CHLORDIAZEPOXIDE HCL 25 MG PO CAPS: 25.0000 mg | ORAL_CAPSULE | Freq: Four times a day (QID) | ORAL | Status: DC | PRN

## 2020-07-27 MED ORDER — ONDANSETRON 4 MG PO TBDP: 4.0000 mg | ORAL_TABLET | Freq: Four times a day (QID) | ORAL | Status: DC | PRN

## 2020-07-27 MED ORDER — THIAMINE HCL 100 MG/ML IJ SOLN: 100.0000 mg | Freq: Once | INTRAMUSCULAR | Status: DC

## 2020-07-27 MED ORDER — IBUPROFEN 600 MG PO TABS
600.0000 mg | ORAL_TABLET | Freq: Once | ORAL | Status: AC
  Administered 2020-07-27: 600 mg via ORAL
  Filled 2020-07-27: qty 1

## 2020-07-27 MED ORDER — AMOXICILLIN 500 MG PO CAPS
500.0000 mg | ORAL_CAPSULE | Freq: Three times a day (TID) | ORAL | Status: DC
  Administered 2020-07-27: 500 mg via ORAL
  Filled 2020-07-27: qty 1

## 2020-07-27 MED ORDER — ARIPIPRAZOLE 10 MG PO TABS
10.0000 mg | ORAL_TABLET | Freq: Once | ORAL | Status: AC
  Administered 2020-07-27: 10 mg via ORAL
  Filled 2020-07-27: qty 1

## 2020-07-27 MED ORDER — NICOTINE 21 MG/24HR TD PT24
21.0000 mg | MEDICATED_PATCH | Freq: Once | TRANSDERMAL | Status: AC
  Administered 2020-07-27: 21 mg via TRANSDERMAL
  Filled 2020-07-27: qty 1

## 2020-07-27 MED ORDER — HYDROXYZINE HCL 25 MG PO TABS
25.0000 mg | ORAL_TABLET | Freq: Four times a day (QID) | ORAL | Status: DC | PRN
  Administered 2020-07-27: 25 mg via ORAL
  Filled 2020-07-27: qty 1

## 2020-07-27 MED ORDER — LOPERAMIDE HCL 2 MG PO CAPS: 2.0000 mg | ORAL_CAPSULE | ORAL | Status: DC | PRN

## 2020-07-27 NOTE — ED Notes (Signed)
Report called to [facility] 500 unit RN

## 2020-07-27 NOTE — ED Notes (Addendum)
After recalling [facility], informed by [facility] that report could be called. Per [facility] RN could not take report.

## 2020-07-27 NOTE — ED Notes (Signed)
Patient sitting and watching television. Monitoring continues and safety maintained.

## 2020-07-27 NOTE — ED Notes (Signed)
Attempt to call report. Per [facility] they are not ready for pt yet and will call back.

## 2020-07-27 NOTE — ED Notes (Signed)
Dried blood, stuck on sock, noted to rt great toe.  Foot soaked and band aid with neosporin ointment to rt great toe.
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
## 2020-07-27 NOTE — ED Notes (Signed)
Pt informed of pending transfer to [facility], all questions answered. A&O, Calm & cooperative, continues to endorse passive SI, no plan or intent. Agrees to notify staff if thoughts worsen or change. Denies HI/AVH. NAD

## 2020-07-27 NOTE — Discharge Instructions (Signed)
Discharged to [facility] 

## 2020-07-27 NOTE — ED Notes (Signed)
Snack given (two nurtigrain bars, an a drink)

## 2020-07-27 NOTE — ED Notes (Signed)
Pt given Gram crackers, Cornflakes, and diet coke

## 2020-07-27 NOTE — ED Notes (Signed)
Pt given turkey sandwich

## 2020-07-27 NOTE — ED Notes (Signed)
Patient is alert, oriented. Patient is voices SI thoughts this morning did not state a plan and contracts for safety on unit. Patient states he has a bad tooth ache this morning. Patient provided support and encouragement. Monitoring continues and safety maintained,

## 2020-07-27 NOTE — ED Notes (Signed)
Patient sitting in chair watching television. Monitoring continues and safety maintained on unit.
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
## 2020-07-27 NOTE — ED Notes (Signed)
Pt resting comfortably, no distress noted, monitoring for safety. 

## 2020-07-27 NOTE — ED Triage Notes (Signed)
Presents with SI, plan to step into traffic.Positive AVH.

## 2020-07-28 ENCOUNTER — Encounter (HOSPITAL_COMMUNITY): Payer: Self-pay | Admitting: Physician Assistant

## 2020-07-28 ENCOUNTER — Other Ambulatory Visit: Payer: Self-pay

## 2020-07-28 ENCOUNTER — Inpatient Hospital Stay (HOSPITAL_COMMUNITY)
Admission: AD | Admit: 2020-07-28 | Discharge: 2020-08-02 | DRG: 880 | Disposition: A | Discharge status: Home / Self Care | Payer: Federal, State, Local not specified - Other | Source: Intra-hospital | Attending: Psychiatry | Admitting: Psychiatry

## 2020-07-28 DIAGNOSIS — Z20822 Contact with and (suspected) exposure to covid-19: Secondary | ICD-10-CM | POA: Diagnosis present

## 2020-07-28 DIAGNOSIS — F319 Bipolar disorder, unspecified: Secondary | ICD-10-CM | POA: Diagnosis present

## 2020-07-28 DIAGNOSIS — I10 Essential (primary) hypertension: Secondary | ICD-10-CM | POA: Diagnosis present

## 2020-07-28 DIAGNOSIS — M62830 Muscle spasm of back: Secondary | ICD-10-CM | POA: Diagnosis present

## 2020-07-28 DIAGNOSIS — R45851 Suicidal ideations: Secondary | ICD-10-CM | POA: Diagnosis present

## 2020-07-28 DIAGNOSIS — G4700 Insomnia, unspecified: Secondary | ICD-10-CM | POA: Diagnosis present

## 2020-07-28 DIAGNOSIS — F1010 Alcohol abuse, uncomplicated: Secondary | ICD-10-CM | POA: Diagnosis present

## 2020-07-28 DIAGNOSIS — R441 Visual hallucinations: Secondary | ICD-10-CM | POA: Diagnosis present

## 2020-07-28 DIAGNOSIS — H9201 Otalgia, right ear: Secondary | ICD-10-CM | POA: Diagnosis present

## 2020-07-28 DIAGNOSIS — F1021 Alcohol dependence, in remission: Secondary | ICD-10-CM | POA: Diagnosis not present

## 2020-07-28 DIAGNOSIS — F17210 Nicotine dependence, cigarettes, uncomplicated: Secondary | ICD-10-CM | POA: Diagnosis present

## 2020-07-28 DIAGNOSIS — F6089 Other specific personality disorders: Secondary | ICD-10-CM | POA: Diagnosis present

## 2020-07-28 DIAGNOSIS — F3164 Bipolar disorder, current episode mixed, severe, with psychotic features: Secondary | ICD-10-CM | POA: Diagnosis not present

## 2020-07-28 DIAGNOSIS — F1111 Opioid abuse, in remission: Secondary | ICD-10-CM | POA: Diagnosis not present

## 2020-07-28 DIAGNOSIS — F419 Anxiety disorder, unspecified: Secondary | ICD-10-CM | POA: Diagnosis present

## 2020-07-28 DIAGNOSIS — R440 Auditory hallucinations: Secondary | ICD-10-CM | POA: Diagnosis present

## 2020-07-28 DIAGNOSIS — F17200 Nicotine dependence, unspecified, uncomplicated: Secondary | ICD-10-CM | POA: Diagnosis present

## 2020-07-28 DIAGNOSIS — F4310 Post-traumatic stress disorder, unspecified: Secondary | ICD-10-CM | POA: Diagnosis present

## 2020-07-28 DIAGNOSIS — F1910 Other psychoactive substance abuse, uncomplicated: Secondary | ICD-10-CM | POA: Diagnosis present

## 2020-07-28 DIAGNOSIS — F1020 Alcohol dependence, uncomplicated: Secondary | ICD-10-CM | POA: Diagnosis not present

## 2020-07-28 LAB — PROLACTIN: Prolactin: 9 ng/mL (ref 4.0–15.2)

## 2020-07-28 MED ORDER — METHOCARBAMOL 500 MG PO TABS
500.0000 mg | ORAL_TABLET | Freq: Three times a day (TID) | ORAL | Status: DC | PRN
  Administered 2020-07-29 – 2020-08-01 (×9): 500 mg via ORAL
  Filled 2020-07-28 (×10): qty 1

## 2020-07-28 MED ORDER — HYDROXYZINE HCL 25 MG PO TABS
25.0000 mg | ORAL_TABLET | Freq: Four times a day (QID) | ORAL | Status: AC | PRN
  Administered 2020-07-28 – 2020-07-30 (×6): 25 mg via ORAL
  Filled 2020-07-28 (×6): qty 1

## 2020-07-28 MED ORDER — ONDANSETRON 4 MG PO TBDP: 4.0000 mg | ORAL_TABLET | Freq: Four times a day (QID) | ORAL | Status: DC | PRN

## 2020-07-28 MED ORDER — LOPERAMIDE HCL 2 MG PO CAPS
2.0000 mg | ORAL_CAPSULE | ORAL | Status: AC | PRN
  Administered 2020-07-29: 4 mg via ORAL
  Filled 2020-07-28: qty 2

## 2020-07-28 MED ORDER — AMOXICILLIN 500 MG PO CAPS
500.0000 mg | ORAL_CAPSULE | Freq: Three times a day (TID) | ORAL | Status: DC
  Administered 2020-07-28 – 2020-08-02 (×16): 500 mg via ORAL
  Filled 2020-07-28: qty 1
  Filled 2020-07-28: qty 5
  Filled 2020-07-28 (×3): qty 1
  Filled 2020-07-28: qty 2
  Filled 2020-07-28 (×4): qty 1
  Filled 2020-07-28 (×2): qty 5
  Filled 2020-07-28: qty 2
  Filled 2020-07-28 (×3): qty 1
  Filled 2020-07-28 (×2): qty 5
  Filled 2020-07-28 (×3): qty 1
  Filled 2020-07-28: qty 2
  Filled 2020-07-28 (×4): qty 1
  Filled 2020-07-28: qty 5
  Filled 2020-07-28: qty 1

## 2020-07-28 MED ORDER — CLONIDINE HCL 0.1 MG PO TABS: 0.1000 mg | ORAL_TABLET | ORAL | Status: DC

## 2020-07-28 MED ORDER — CHLORDIAZEPOXIDE HCL 25 MG PO CAPS
25.0000 mg | ORAL_CAPSULE | Freq: Four times a day (QID) | ORAL | Status: AC | PRN
  Administered 2020-07-28 – 2020-07-30 (×9): 25 mg via ORAL
  Filled 2020-07-28 (×10): qty 1

## 2020-07-28 MED ORDER — LOPERAMIDE HCL 2 MG PO CAPS: 2.0000 mg | ORAL_CAPSULE | ORAL | Status: DC | PRN

## 2020-07-28 MED ORDER — CLONIDINE HCL 0.1 MG PO TABS
0.1000 mg | ORAL_TABLET | Freq: Four times a day (QID) | ORAL | Status: DC
  Filled 2020-07-28 (×5): qty 1

## 2020-07-28 MED ORDER — NICOTINE 21 MG/24HR TD PT24
21.0000 mg | MEDICATED_PATCH | Freq: Every day | TRANSDERMAL | Status: DC
  Administered 2020-07-28 – 2020-08-01 (×5): 21 mg via TRANSDERMAL
  Filled 2020-07-28 (×8): qty 1

## 2020-07-28 MED ORDER — IBUPROFEN 800 MG PO TABS
800.0000 mg | ORAL_TABLET | Freq: Three times a day (TID) | ORAL | Status: DC | PRN
  Administered 2020-07-28 – 2020-08-02 (×9): 800 mg via ORAL
  Filled 2020-07-28 (×9): qty 1

## 2020-07-28 MED ORDER — MAGNESIUM HYDROXIDE 400 MG/5ML PO SUSP: 30.0000 mL | Freq: Every day | ORAL | Status: DC | PRN

## 2020-07-28 MED ORDER — THIAMINE HCL 100 MG/ML IJ SOLN: 100.0000 mg | Freq: Once | INTRAMUSCULAR | Status: DC

## 2020-07-28 MED ORDER — ACETAMINOPHEN 325 MG PO TABS
650.0000 mg | ORAL_TABLET | Freq: Four times a day (QID) | ORAL | Status: DC | PRN
  Administered 2020-07-28 – 2020-08-01 (×5): 650 mg via ORAL
  Filled 2020-07-28 (×5): qty 2

## 2020-07-28 MED ORDER — THIAMINE HCL 100 MG PO TABS
100.0000 mg | ORAL_TABLET | Freq: Every day | ORAL | Status: DC
  Administered 2020-07-29 – 2020-08-02 (×4): 100 mg via ORAL
  Filled 2020-07-28 (×7): qty 1

## 2020-07-28 MED ORDER — DICYCLOMINE HCL 20 MG PO TABS
20.0000 mg | ORAL_TABLET | Freq: Four times a day (QID) | ORAL | Status: DC | PRN
  Administered 2020-07-29 – 2020-07-30 (×2): 20 mg via ORAL
  Filled 2020-07-28 (×2): qty 1

## 2020-07-28 MED ORDER — HYDROXYZINE HCL 25 MG PO TABS
25.0000 mg | ORAL_TABLET | Freq: Four times a day (QID) | ORAL | Status: DC | PRN
  Administered 2020-07-28: 25 mg via ORAL
  Filled 2020-07-28: qty 1

## 2020-07-28 MED ORDER — ADULT MULTIVITAMIN W/MINERALS CH
1.0000 | ORAL_TABLET | Freq: Every day | ORAL | Status: DC
  Administered 2020-07-28 – 2020-08-01 (×4): 1 via ORAL
  Filled 2020-07-28 (×8): qty 1

## 2020-07-28 MED ORDER — QUETIAPINE FUMARATE 400 MG PO TABS
400.0000 mg | ORAL_TABLET | Freq: Every day | ORAL | Status: DC
  Administered 2020-07-28: 400 mg via ORAL
  Filled 2020-07-28 (×2): qty 1

## 2020-07-28 MED ORDER — QUETIAPINE FUMARATE 300 MG PO TABS
300.0000 mg | ORAL_TABLET | Freq: Every day | ORAL | Status: DC
  Administered 2020-07-28: 300 mg via ORAL
  Filled 2020-07-28 (×3): qty 1

## 2020-07-28 MED ORDER — ONDANSETRON 4 MG PO TBDP
4.0000 mg | ORAL_TABLET | Freq: Four times a day (QID) | ORAL | Status: DC | PRN
  Administered 2020-07-30 – 2020-08-01 (×4): 4 mg via ORAL
  Filled 2020-07-28 (×4): qty 1

## 2020-07-28 MED ORDER — ALUM & MAG HYDROXIDE-SIMETH 200-200-20 MG/5ML PO SUSP
30.0000 mL | ORAL | Status: DC | PRN
  Administered 2020-08-01: 30 mL via ORAL
  Filled 2020-07-28: qty 30

## 2020-07-28 MED ORDER — CLONIDINE HCL 0.1 MG PO TABS: 0.1000 mg | ORAL_TABLET | Freq: Every day | ORAL | Status: DC

## 2020-07-28 MED ORDER — MIRTAZAPINE 15 MG PO TABS
15.0000 mg | ORAL_TABLET | Freq: Every day | ORAL | Status: DC
  Administered 2020-07-28 – 2020-07-29 (×2): 15 mg via ORAL
  Filled 2020-07-28 (×5): qty 1
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
## 2020-07-28 NOTE — BHH Suicide Risk Assessment (Signed)
BHH INPATIENT:  Family/Significant Other Suicide Prevention Education  Suicide Prevention Education:  Patient unable to provide consent for Family/Significant Other Suicide Prevention Education: The patient has been unable to provide written consent for family/significant other to be provided Family/Significant Other Suicide Prevention Education during admission and/or prior to discharge.  Physician notified.     SPE completed with patient, as patient has been unable to provide consent for family contact. SPE pamphlet placed on chart for patient to share with supports at discharge.    Pecola Haxton MSW, LCSW Clincal Social Worker  [facility]   

## 2020-07-28 NOTE — Progress Notes (Signed)
   07/28/20 1600  Psych Admission Type (Psych Patients Only)  Admission Status Voluntary  Psychosocial Assessment  Patient Complaints Substance abuse  Eye Contact Fair  Facial Expression Animated  Affect Anxious  Speech Logical/coherent  Interaction Assertive  Motor Activity Slow  Appearance/Hygiene Disheveled  Behavior Characteristics Cooperative  Mood Anxious  Thought Process  Coherency WDL  Content WDL  Delusions Paranoid  Perception Hallucinations  Hallucination Auditory;Visual  Judgment Impaired  Confusion None  Danger to Self  Current suicidal ideation? Passive  Self-Injurious Behavior Some self-injurious ideation observed or expressed.  No lethal plan expressed   Agreement Not to Harm Self Yes  Description of Agreement verbal  Danger to Others  Danger to Others None reported or observed  Dar Note: Patient presents with anxious affect and mood.  Reports passive SI but verbally contracts for safety.  Requested and received Librium 25 mg x 2 for complain of withdrawal symptoms.  Routine safety checks maintained every 15 minutes.  Patient is safe on and off the unit.

## 2020-07-28 NOTE — Progress Notes (Signed)
Patient endorses AVH this shift.  Patient states that his voices are very loud and non stop and they are commanding him to harm himself.  Patient states that if he was in a less secure setting that he would engage in suicidal behaviors but he is able to maintain safety on the unit.  Patient was noted to have a cheerful smiling affect which was incongruent to what he was stating to Clinical research associate.  Patient has had complaints of withdrawal but has been able to be maintained safely on librium.  Assess patient for safety, monitor for symptoms of withdrawal, offer medications as prescribed, engage patient in 1:1 staff talks.   Continue to monitor per individualized treatment plan.  Patient's safety was maintained while on the unit.
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
## 2020-07-28 NOTE — BHH Counselor (Signed)
CSW sent medical records requests to the following facilities:    [facility]  [facility]   [facility] and IDD    Ruthann Cancer MSW, LCSW Clincal Social Worker  [facility]

## 2020-07-28 NOTE — Tx Team (Signed)
Initial Treatment Plan 07/28/2020 7:04 AM [name] [id number]    PATIENT STRESSORS: Medication change or noncompliance Substance abuse   PATIENT STRENGTHS: Ability for insight Capable of independent living Communication skills General fund of knowledge Motivation for treatment/growth   PATIENT IDENTIFIED PROBLEMS: I have been drinking 4 40oz of beer daily   I hear voices and see things                    DISCHARGE CRITERIA:  Reduction of life-threatening or endangering symptoms to within safe limits Withdrawal symptoms are absent or subacute and managed without 24-hour nursing intervention  PRELIMINARY DISCHARGE PLAN: Return to previous work or school arrangements  PATIENT/FAMILY INVOLVEMENT: This treatment plan has been presented to and reviewed with the patient, Trice Aspinall, and/or family member, The patient and family have been given the opportunity to ask questions and make suggestions.  Jerrye Bushy, RN 07/28/2020, 7:04 AM

## 2020-07-29 MED ORDER — ARIPIPRAZOLE 10 MG PO TABS
10.0000 mg | ORAL_TABLET | Freq: Every day | ORAL | Status: DC
  Administered 2020-07-29 – 2020-08-02 (×5): 10 mg via ORAL
  Filled 2020-07-29 (×5): qty 1
  Filled 2020-07-29 (×2): qty 7
  Filled 2020-07-29: qty 1

## 2020-07-29 MED ORDER — ARIPIPRAZOLE ER 400 MG IM SRER
300.0000 mg | Freq: Once | INTRAMUSCULAR | Status: AC
  Administered 2020-07-29: 300 mg via INTRAMUSCULAR

## 2020-07-29 MED ORDER — BUSPIRONE HCL 10 MG PO TABS
10.0000 mg | ORAL_TABLET | Freq: Three times a day (TID) | ORAL | Status: DC
  Administered 2020-07-29 – 2020-08-02 (×12): 10 mg via ORAL
  Filled 2020-07-29: qty 1
  Filled 2020-07-29: qty 2
  Filled 2020-07-29 (×7): qty 1
  Filled 2020-07-29: qty 21
  Filled 2020-07-29: qty 1
  Filled 2020-07-29: qty 21
  Filled 2020-07-29 (×3): qty 1
  Filled 2020-07-29: qty 21
  Filled 2020-07-29: qty 1
  Filled 2020-07-29: qty 2
  Filled 2020-07-29 (×3): qty 21
  Filled 2020-07-29: qty 1

## 2020-07-29 NOTE — Tx Team (Signed)
Interdisciplinary Treatment and Diagnostic Plan Update  07/29/2020 Time of Session: 9:10am Lance Abbott MRN: [id number]  Principal Diagnosis: Suicide ideation  Secondary Diagnoses: Principal Problem:   Suicide ideation Active Problems:   Alcohol use disorder, severe, in early remission (HCC)   Substance abuse (HCC)   Alcohol use disorder, severe, dependence (HCC)   Bipolar disorder, unspecified (HCC)   Current Medications:  Current Facility-Administered Medications  Medication Dose Route Frequency Provider Last Rate Last Admin  . acetaminophen (TYLENOL) tablet 650 mg  650 mg Oral Q6H PRN Nwoko, Uchenna E, PA   650 mg at 07/28/20 1435  . alum & mag hydroxide-simeth (MAALOX/MYLANTA) 200-200-20 MG/5ML suspension 30 mL  30 mL Oral Q4H PRN Nwoko, Uchenna E, PA      . amoxicillin (AMOXIL) capsule 500 mg  500 mg Oral Q8H Nwoko, Uchenna E, PA   500 mg at 07/29/20 0710  . ARIPiprazole (ABILIFY) tablet 10 mg  10 mg Oral Daily Antonieta Pert, MD   10 mg at 07/29/20 0935  . chlordiazePOXIDE (LIBRIUM) capsule 25 mg  25 mg Oral Q6H PRN Nwoko, Uchenna E, PA   25 mg at 07/29/20 0752  . dicyclomine (BENTYL) tablet 20 mg  20 mg Oral Q6H PRN Nwoko, Uchenna E, PA   20 mg at 07/29/20 0710  . hydrOXYzine (ATARAX/VISTARIL) tablet 25 mg  25 mg Oral Q6H PRN Nwoko, Uchenna E, PA   25 mg at 07/29/20 0710  . ibuprofen (ADVIL) tablet 800 mg  800 mg Oral TID PRN Nwoko, Uchenna E, PA   800 mg at 07/29/20 0710  . loperamide (IMODIUM) capsule 2-4 mg  2-4 mg Oral PRN Nwoko, Uchenna E, PA   4 mg at 07/29/20 0710  . magnesium hydroxide (MILK OF MAGNESIA) suspension 30 mL  30 mL Oral Daily PRN Nwoko, Uchenna E, PA      . methocarbamol (ROBAXIN) tablet 500 mg  500 mg Oral Q8H PRN Nwoko, Uchenna E, PA   500 mg at 07/29/20 0936  . mirtazapine (REMERON) tablet 15 mg  15 mg Oral QHS Antonieta Pert, MD   15 mg at 07/28/20 2109  . multivitamin with minerals tablet 1 tablet  1 tablet Oral Daily Nwoko, Uchenna E, PA   1  tablet at 07/29/20 0752  . nicotine (NICODERM CQ - dosed in mg/24 hours) patch 21 mg  21 mg Transdermal Daily Nwoko, Uchenna E, PA   21 mg at 07/29/20 0752  . ondansetron (ZOFRAN-ODT) disintegrating tablet 4 mg  4 mg Oral Q6H PRN Nwoko, Uchenna E, PA      . thiamine (B-1) injection 100 mg  100 mg Intramuscular Once Nwoko, Uchenna E, PA      . thiamine tablet 100 mg  100 mg Oral Daily Nwoko, Uchenna E, PA   100 mg at 07/29/20 7494   PTA Medications: Medications Prior to Admission  Medication Sig Dispense Refill Last Dose  . busPIRone (BUSPAR) 10 MG tablet Take one every 8 hours for anxiety (Patient not taking: Reported on 07/27/2020) 30 tablet 0   . chlordiazePOXIDE (LIBRIUM) 25 MG capsule 50mg  PO TID x 1D, then 25-50mg  PO BID X 1D, then 25-50mg  PO QD X 1D (Patient not taking: Reported on 07/27/2020) 10 capsule 0   . hydrOXYzine (ATARAX/VISTARIL) 25 MG tablet Take 1 tablet (25 mg total) by mouth 3 (three) times daily as needed for anxiety. (Patient not taking: Reported on 07/27/2020) 60 tablet 0   . QUEtiapine (SEROQUEL) 300 MG tablet Take 1 tablet (300  mg total) by mouth at bedtime. For mood control (Patient not taking: Reported on 07/27/2020) 30 tablet 0   . QUEtiapine (SEROQUEL) 50 MG tablet Take 1 tablet (50 mg total) by mouth daily. For agitation (Patient not taking: Reported on 07/27/2020) 30 tablet 0   . tiZANidine (ZANAFLEX) 4 MG capsule Take 1 capsule (4 mg total) by mouth 3 (three) times daily as needed for muscle spasms. (Patient not taking: Reported on 07/27/2020) 30 capsule 0   . traZODone (DESYREL) 50 MG tablet Take 1 tablet (50 mg total) by mouth at bedtime as needed for sleep. (Patient not taking: Reported on 07/27/2020) 30 tablet 0     Patient Stressors: Medication change or noncompliance Substance abuse  Patient Strengths: Ability for insight Capable of independent living Communication skills General fund of knowledge Motivation for treatment/growth  Treatment Modalities:  Medication Management, Group therapy, Case management,  1 to 1 session with clinician, Psychoeducation, Recreational therapy.   Physician Treatment Plan for Primary Diagnosis: Suicide ideation Long Term Goal(s): Improvement in symptoms so as ready for discharge Improvement in symptoms so as ready for discharge   Short Term Goals: Ability to disclose and discuss suicidal ideas Compliance with prescribed medications will improve Ability to identify triggers associated with substance abuse/mental health issues will improve Ability to disclose and discuss suicidal ideas Compliance with prescribed medications will improve Ability to identify triggers associated with substance abuse/mental health issues will improve  Medication Management: Evaluate patient's response, side effects, and tolerance of medication regimen.  Therapeutic Interventions: 1 to 1 sessions, Unit Group sessions and Medication administration.  Evaluation of Outcomes: Progressing  Physician Treatment Plan for Secondary Diagnosis: Principal Problem:   Suicide ideation Active Problems:   Alcohol use disorder, severe, in early remission (HCC)   Substance abuse (HCC)   Alcohol use disorder, severe, dependence (HCC)   Bipolar disorder, unspecified (HCC)  Long Term Goal(s): Improvement in symptoms so as ready for discharge Improvement in symptoms so as ready for discharge   Short Term Goals: Ability to disclose and discuss suicidal ideas Compliance with prescribed medications will improve Ability to identify triggers associated with substance abuse/mental health issues will improve Ability to disclose and discuss suicidal ideas Compliance with prescribed medications will improve Ability to identify triggers associated with substance abuse/mental health issues will improve     Medication Management: Evaluate patient's response, side effects, and tolerance of medication regimen.  Therapeutic Interventions: 1 to 1  sessions, Unit Group sessions and Medication administration.  Evaluation of Outcomes: Progressing   RN Treatment Plan for Primary Diagnosis: Suicide ideation Long Term Goal(s): Knowledge of disease and therapeutic regimen to maintain health will improve  Short Term Goals: Ability to demonstrate self-control, Ability to participate in decision making will improve, Ability to disclose and discuss suicidal ideas, Ability to identify and develop effective coping behaviors will improve and Compliance with prescribed medications will improve  Medication Management: RN will administer medications as ordered by provider, will assess and evaluate patient's response and provide education to patient for prescribed medication. RN will report any adverse and/or side effects to prescribing provider.  Therapeutic Interventions: 1 on 1 counseling sessions, Psychoeducation, Medication administration, Evaluate responses to treatment, Monitor vital signs and CBGs as ordered, Perform/monitor CIWA, COWS, AIMS and Fall Risk screenings as ordered, Perform wound care treatments as ordered.  Evaluation of Outcomes: Progressing   LCSW Treatment Plan for Primary Diagnosis: Suicide ideation Long Term Goal(s): Safe transition to appropriate next level of care at discharge, Engage patient in  therapeutic group addressing interpersonal concerns.  Short Term Goals: Engage patient in aftercare planning with referrals and resources, Increase social support, Increase emotional regulation, Facilitate acceptance of mental health diagnosis and concerns, Facilitate patient progression through stages of change regarding substance use diagnoses and concerns and Identify triggers associated with mental health/substance abuse issues  Therapeutic Interventions: Assess for all discharge needs, 1 to 1 time with Social worker, Explore available resources and support systems, Assess for adequacy in community support network, Educate family  and significant other(s) on suicide prevention, Complete Psychosocial Assessment, Interpersonal group therapy.  Evaluation of Outcomes: Progressing   Progress in Treatment: Attending groups: No. Participating in groups: No. Taking medication as prescribed: Yes. Toleration medication: Yes. Family/Significant other contact made: No, will contact:  Declined consents Patient understands diagnosis: Yes. Discussing patient identified problems/goals with staff: Yes. Medical problems stabilized or resolved: Yes. Denies suicidal/homicidal ideation: Yes. Issues/concerns per patient self-inventory: No.   New problem(s) identified: No, Describe:  None  New Short Term/Long Term Goal(s): medication stabilization, elimination of SI thoughts, development of comprehensive mental wellness plan.   Patient Goals:  "To get help to stop the voices and to get medication"   Discharge Plan or Barriers: Upon discharge pt will return home with his father and brother. Pt will attend a local agency for therapy and medication management.   Reason for Continuation of Hospitalization: Hallucinations Medication stabilization Suicidal ideation  Estimated Length of Stay: 3 to 5 days  Attendees: Patient: Lance Abbott  07/29/2020  Physician: Landry Mellow, MD 07/29/2020  Nursing:  07/29/2020   RN Care Manager: 07/29/2020   Social Worker:  Mckinley Jewel, LCSW 07/29/2020   Recreational Therapist:  07/29/2020   Other: Melba Coon, LCSW 07/29/2020   Other:  07/29/2020   Other: 07/29/2020     Scribe for Treatment Team: Aram Beecham, LCSWA 07/29/2020 10:09 AM

## 2020-07-29 NOTE — Progress Notes (Signed)
Adult Psychoeducational Group Note  Date:  07/29/2020 Time:  9:00 PM  Group Topic/Focus:  Wellness Toolbox:   The focus of this group is to discuss various aspects of wellness, balancing those aspects and exploring ways to increase the ability to experience wellness.  Patients will create a wellness toolbox for use upon discharge.  Participation Level:  Active  Participation Quality:  Appropriate  Affect:  Appropriate  Cognitive:  Oriented  Insight: Appropriate  Engagement in Group:  Engaged  Modes of Intervention:  Education  Additional Comments:  Patient attended and participated in group tonight. He reports having a good day today. He talked to his doctor,got an injection with medication that will last him for 30 days.Today he learnt about pharmacies where he could get his medication for free. He started some new medication today and is liking how he is feeling on it.   Lita Mains [place] 07/29/2020, 9:00 PM
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
## 2020-07-29 NOTE — Plan of Care (Signed)
Progress note  D: pt found in bed; compliant with medication administration. Pt has complaints of withdrawal symptoms that include anxiety, sweating, agitation, and muscle spasms. Pt also has complaints of insomnia. Pt seems religiously preoccupied with conversations with peers. Pt has been seen in the dayroom interacting. Pt can be needy at times and is hypervigilant with their medications. Pt expresses want for their Abilify to be long acting as they don't feel like they will have the resources to fill their Rx's when discharged. Pt does express avh. Pt denies si/hi and verbally agrees to approach staff if these become apparent or before harming themself/others while at [facility].  A: Pt provided support and encouragement. Pt given medication per protocol and standing orders. Q53m safety checks implemented and continued.  R: Pt safe on the unit. Will continue to monitor.  Pt progressing in the following metrics  Problem: Education: Goal: Knowledge of [facility] Education information/materials will improve Outcome: Progressing Goal: Emotional status will improve Outcome: Progressing Goal: Mental status will improve Outcome: Progressing Goal: Verbalization of understanding the information provided will improve Outcome: Progressing

## 2020-07-29 NOTE — [facility] Counselor (Signed)
CSW faxed residential substance use referrals to [facility] and [facility].   CSW is awaiting authorization number from [employer] in order to complete referral for [facility].   Ruthann Cancer MSW, LCSW Clincal Social Worker  [place]

## 2020-07-30 MED ORDER — CLONIDINE HCL 0.2 MG PO TABS
0.2000 mg | ORAL_TABLET | Freq: Once | ORAL | Status: AC
  Administered 2020-07-30: 0.2 mg via ORAL
  Filled 2020-07-30: qty 2
  Filled 2020-07-30: qty 1

## 2020-07-30 MED ORDER — MIRTAZAPINE 7.5 MG PO TABS
7.5000 mg | ORAL_TABLET | Freq: Every day | ORAL | Status: DC
  Administered 2020-07-30 – 2020-07-31 (×2): 7.5 mg via ORAL
  Filled 2020-07-30 (×4): qty 1

## 2020-07-30 NOTE — Progress Notes (Signed)
The patient rated his day as a 8 out of 10 since he had a good talk with his family and because he felt that the medications were more effective. His goal for tomorrow is to have another good day.

## 2020-07-30 NOTE — [facility] Group Notes (Signed)
Occupational Therapy Group Note Date: 07/30/2020 Group Topic/Focus: Coping Skills  Group Description: Group encouraged increased engagement and participation through discussion and activity focused on "Building Happiness". Group discussed and identified six specific strategies to building happiness including identifying gratitude, acts of kindness, exercise, meditation, positive journal writing, and fostering relationships. Patients were then encouraged to practice and identify one strategy they found most useful that they could begin implementing into their daily routines.  Participation Level: Active   Participation Quality: Independent   Behavior: Cooperative and Interactive   Speech/Thought Process: Disorganized   Affect/Mood: Full range   Insight: Limited   Judgement: Fair   Individualization: Lance Abbott was active in his participation, though oddly related throughout discussion. He appeared overly animated and superficially cheerful at times. He identified difficulty fostering relationships within his life, though noted talking to his mom on the phone while here, after not having done so in the last three months. Pt went off on a tangent at one point discussing his dog who is "75% black lab and 25% pitbull" and shared "I taught him how to wink." Overall, redirectable and pleasant, though disorganized.   Modes of Intervention: Activity, Discussion, Education and Socialization  Patient Response to Interventions:  Attentive and Engaged   Plan: Continue to engage patient in OT groups 2 - 3x/week.   07/30/2020  Donne Hazel, MOT, OTR/L
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
## 2020-07-30 NOTE — Progress Notes (Signed)
Patient is assertive about medications. Patient was given Libruim x 1 on shift and per CIWA protocol. He is curretrly in bed eyes closed at this time,

## 2020-07-30 NOTE — Plan of Care (Signed)
Progress note  D: pt found in bed; compliant with medication administration. Pt continues to have multiple detox and physical symptoms including back spasms, L ear infection, and R ear drainage. Pt still has complaints of anxiety and agitation as well. Pt has moved to 300 hall. Once on the hall, pt asked for their medication again from another nurse. It is unclear if this is med seeking or if the pt forgot. Pt also states they are seeing demons and angels for avh. Pt denies si/hi/ though and verbally agrees to approach staff if these become apparent or before harming self/others while at [facility].  A: Pt provided support and encouragement. Pt given medication per protocol and standing orders. Q33m safety checks implemented and continued.  R: Pt safe on the unit. Will continue to monitor.  Pt progressing in the following metrics  Problem: Activity: Goal: Interest or engagement in activities will improve Outcome: Progressing Goal: Sleeping patterns will improve Outcome: Progressing   Problem: Coping: Goal: Ability to verbalize frustrations and anger appropriately will improve Outcome: Progressing Goal: Ability to demonstrate self-control will improve Outcome: Progressing

## 2020-07-30 NOTE — Progress Notes (Signed)
   07/30/20 2129  COVID-19 Daily Checkoff  Have you had a fever (temp > 37.80C/100F)  in the past 24 hours?  No  COVID-19 EXPOSURE  Have you traveled outside the state in the past 14 days? No  Have you been in contact with someone with a confirmed diagnosis of COVID-19 or PUI in the past 14 days without wearing appropriate PPE? No  Have you been living in the same home as a person with confirmed diagnosis of COVID-19 or a PUI (household contact)? No  Have you been diagnosed with COVID-19? No

## 2020-07-31 DIAGNOSIS — F1020 Alcohol dependence, uncomplicated: Secondary | ICD-10-CM

## 2020-07-31 DIAGNOSIS — F1021 Alcohol dependence, in remission: Secondary | ICD-10-CM

## 2020-07-31 DIAGNOSIS — F1910 Other psychoactive substance abuse, uncomplicated: Secondary | ICD-10-CM

## 2020-07-31 DIAGNOSIS — F3164 Bipolar disorder, current episode mixed, severe, with psychotic features: Secondary | ICD-10-CM

## 2020-07-31 DIAGNOSIS — R45851 Suicidal ideations: Principal | ICD-10-CM

## 2020-07-31 MED ORDER — QUETIAPINE FUMARATE 25 MG PO TABS
ORAL_TABLET | ORAL | Status: AC
  Filled 2020-07-31: qty 1

## 2020-07-31 MED ORDER — QUETIAPINE FUMARATE 25 MG PO TABS
25.0000 mg | ORAL_TABLET | Freq: Three times a day (TID) | ORAL | Status: DC
  Administered 2020-07-31 – 2020-08-02 (×5): 25 mg via ORAL
  Filled 2020-07-31 (×5): qty 1

## 2020-07-31 NOTE — Progress Notes (Signed)
Pt continues to c/o withdrawal symptoms and was medicated according to his PRN orders. Pt endorses AH of voices telling him to hurt himself and VH of angels and demons. He said he was passively suicidal without a plan and verbally contracts for safety. Agrees to approach staff immediately for any thoughts of hurting himself or others. Pt continues to show med-seeking behaviors. He requested his vistaril and said "just give it to me." This Clinical research associate informed him that it can only be administered for the indication listed and at the appropriate time. Pt laughingly said okay. Active listening, reassurance, and support provided. Medications administered as ordered by MD. Q 15 min safety checks continue. Pt's safety has been maintained.   07/30/20 2129  Psych Admission Type (Psych Patients Only)  Admission Status Voluntary  Psychosocial Assessment  Patient Complaints Anxiety;Substance abuse  Eye Contact Fair  Facial Expression Anxious;Pensive  Affect Anxious  Speech Logical/coherent  Interaction Assertive;Needy  Motor Activity Fidgety;Restless  Appearance/Hygiene Unremarkable  Behavior Characteristics Cooperative;Anxious;Fidgety;Restless  Mood Depressed;Anxious;Pleasant  Thought Administrator, sports thinking  Content Obsessions;Religiosity  Delusions Somatic  Perception Hallucinations  Hallucination Auditory;Visual  Judgment Poor  Confusion None  Danger to Self  Current suicidal ideation? Passive  Self-Injurious Behavior No self-injurious ideation or behavior indicators observed or expressed   Agreement Not to Harm Self Yes  Description of Agreement verbally agrees to approach staff immediately for any thoughts of hurting self or anyone else  Danger to Others  Danger to Others None reported or observed

## 2020-07-31 NOTE — Progress Notes (Signed)
Adult Psychoeducational Group Note  Date:  07/31/2020 Time:  11:39 PM  Group Topic/Focus:  Wrap-Up Group:   The focus of this group is to help patients review their daily goal of treatment and discuss progress on daily workbooks.  Participation Level:  Active  Participation Quality:  Appropriate  Affect:  Appropriate  Cognitive:  Alert  Insight: Improving  Engagement in Group:  Developing/Improving  Modes of Intervention:  Education, Exploration and Support  Additional Comments:   Mr. Townsend was very effective, engaged and contributed meaningfully to the meeting.  Cyan Clippinger  Lanice Shirts 07/31/2020, 11:39 PM

## 2020-07-31 NOTE — Progress Notes (Signed)
   07/31/20 1100  Psych Admission Type (Psych Patients Only)  Admission Status Voluntary  Psychosocial Assessment  Patient Complaints Substance abuse  Eye Contact Fair  Facial Expression Anxious;Pensive  Affect Anxious  Speech Logical/coherent  Interaction Assertive;Needy  Motor Activity Fidgety;Restless  Appearance/Hygiene Unremarkable  Behavior Characteristics Cooperative  Mood Preoccupied;Anxious  Thought Process  Coherency Concrete thinking  Content Obsessions;Religiosity  Delusions Somatic  Perception Hallucinations  Hallucination Auditory;Visual  Judgment Poor  Confusion None  Danger to Self  Current suicidal ideation? Passive  Self-Injurious Behavior No self-injurious ideation or behavior indicators observed or expressed   Agreement Not to Harm Self Yes  Description of Agreement verbally agrees to approach staff immediately for any thoughts of hurting self or anyone else  Danger to Others  Danger to Others None reported or observed

## 2020-07-31 NOTE — Progress Notes (Signed)
Recreation Therapy Notes  Date:  9.24.21 Time: 0930 Location: [address] Dayroom  Group Topic: Stress Management  Goal Area(s) Addresses:  Patient will identify positive stress management techniques. Patient will identify benefits of using stress management post d/c.  Intervention: Stress Management  Activity: Guided Imagery.  LRT read a script that focused on going anywhere you envision by floating on a cloud.  Patients were to listen and follow along as script was read to engage in activity.    Education:  Stress Management, Discharge Planning.   Education Outcome: Acknowledges Education  Clinical Observations/Feedback:  Pt did not attend group.    Caroll Rancher, LRT/CTRS        Caroll Rancher A 07/31/2020 10:21 AM
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
## 2020-08-01 MED ORDER — HYDROXYZINE HCL 25 MG PO TABS
25.0000 mg | ORAL_TABLET | Freq: Four times a day (QID) | ORAL | Status: DC | PRN
  Administered 2020-08-01 (×2): 25 mg via ORAL
  Filled 2020-08-01: qty 1
  Filled 2020-08-01: qty 10
  Filled 2020-08-01: qty 1

## 2020-08-01 MED ORDER — TRAZODONE HCL 50 MG PO TABS
50.0000 mg | ORAL_TABLET | Freq: Every evening | ORAL | Status: DC | PRN
  Administered 2020-08-01 (×2): 50 mg via ORAL
  Filled 2020-08-01 (×2): qty 14
  Filled 2020-08-01 (×3): qty 1
  Filled 2020-08-01: qty 14
  Filled 2020-08-01: qty 1
  Filled 2020-08-01: qty 14
  Filled 2020-08-01: qty 1

## 2020-08-01 NOTE — Progress Notes (Signed)
   07/31/20 2007  COVID-19 Daily Checkoff  Have you had a fever (temp > 37.80C/100F)  in the past 24 hours?  No  COVID-19 EXPOSURE  Have you traveled outside the state in the past 14 days? No  Have you been in contact with someone with a confirmed diagnosis of COVID-19 or PUI in the past 14 days without wearing appropriate PPE? No  Have you been living in the same home as a person with confirmed diagnosis of COVID-19 or a PUI (household contact)? No  Have you been diagnosed with COVID-19? No

## 2020-08-01 NOTE — [facility] Group Notes (Signed)
[facility] Group Notes: (Clinical Social Work)   08/01/2020      Type of Therapy:  Group Therapy   Participation Level:  Did Not Attend - was invited both individually by MHT and by overhead announcement, chose not to attend.   Zema Lizardo Grossman-Orr, LCSW 08/01/2020, 1:54 PM    

## 2020-08-01 NOTE — Progress Notes (Signed)
[facility] Group Notes:  (Nursing/MHT/Case Management/Adjunct)  Date:  08/01/2020  Time:  2030  Type of Therapy:  wrap up group  Participation Level:  Active  Participation Quality:  Appropriate, Attentive, Sharing and Supportive  Affect:  Anxious  Cognitive:  Alert  Insight:  Improving  Engagement in Group:  Engaged  Modes of Intervention:  Clarification, Education and Support  Summary of Progress/Problems: Positive thinking and positive change were discussed.    Marcille Buffy 08/01/2020, 9:41 PM
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
## 2020-08-01 NOTE — Progress Notes (Signed)
Patient presents with an anxious mood. He rates depression 0/10 and anxiety 5/10. He denies SI/HI. He denies AVH. He reports difficulty sleeping at bedtime due to the "Remeron causing nightmares and terrors."   Orders reviewed with pt. Vital signs reviewed. Verbal support provided. Pt encouraged to attend groups. 15 minute checks performed for safety.   Patient compliant with treatment plan. Patient denies any medication side effects.
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
## 2020-08-01 NOTE — Progress Notes (Signed)
   08/01/20 2000  COVID-19 Daily Checkoff  Have you had a fever (temp > 37.80C/100F)  in the past 24 hours?  No  COVID-19 EXPOSURE  Have you traveled outside the state in the past 14 days? No  Have you been in contact with someone with a confirmed diagnosis of COVID-19 or PUI in the past 14 days without wearing appropriate PPE? No  Have you been living in the same home as a person with confirmed diagnosis of COVID-19 or a PUI (household contact)? No  Have you been diagnosed with COVID-19? No

## 2020-08-01 NOTE — B[facility] Group Notes (Signed)
Psychoeducational Group Note    Date: 08-01-2020 Time: 1300-1400    Life Skills: This group discusses  Purpose of Group: . The group focus' on teaching patients on how to identify their needs and how to develop the coping skills needed to get their needs met  Participation Level:  Did not attend .  Joanmarie Tsang A   

## 2020-08-01 NOTE — Progress Notes (Signed)
   07/31/20 2007  Psych Admission Type (Psych Patients Only)  Admission Status Voluntary  Psychosocial Assessment  Patient Complaints Substance abuse;Anxiety  Eye Contact Fair  Facial Expression Anxious  Affect Anxious;Appropriate to circumstance  Speech Logical/coherent  Interaction Assertive;Needy  Motor Activity Fidgety  Appearance/Hygiene Unremarkable  Behavior Characteristics Cooperative;Anxious;Fidgety  Mood Anxious;Preoccupied;Pleasant  Thought Administrator, sports thinking  Content Obsessions;Religiosity  Delusions Somatic  Perception WDL  Hallucination None reported or observed  Judgment Poor  Confusion None  Danger to Self  Current suicidal ideation? Denies  Self-Injurious Behavior No self-injurious ideation or behavior indicators observed or expressed   Danger to Others  Danger to Others None reported or observed

## 2020-08-02 DIAGNOSIS — F1590 Other stimulant use, unspecified, uncomplicated: Secondary | ICD-10-CM | POA: Insufficient documentation

## 2020-08-02 DIAGNOSIS — F6089 Other specific personality disorders: Secondary | ICD-10-CM

## 2020-08-02 DIAGNOSIS — N289 Disorder of kidney and ureter, unspecified: Secondary | ICD-10-CM | POA: Insufficient documentation

## 2020-08-02 DIAGNOSIS — R45851 Suicidal ideations: Secondary | ICD-10-CM | POA: Insufficient documentation

## 2020-08-02 DIAGNOSIS — T07XXXA Unspecified multiple injuries, initial encounter: Secondary | ICD-10-CM | POA: Insufficient documentation

## 2020-08-02 DIAGNOSIS — F15951 Other stimulant use, unspecified with stimulant-induced psychotic disorder with hallucinations: Secondary | ICD-10-CM | POA: Insufficient documentation

## 2020-08-02 DIAGNOSIS — F1111 Opioid abuse, in remission: Secondary | ICD-10-CM

## 2020-08-02 DIAGNOSIS — R0781 Pleurodynia: Secondary | ICD-10-CM | POA: Insufficient documentation

## 2020-08-02 DIAGNOSIS — F17219 Nicotine dependence, cigarettes, with unspecified nicotine-induced disorders: Secondary | ICD-10-CM

## 2020-08-02 DIAGNOSIS — F10930 Alcohol use, unspecified with withdrawal, uncomplicated: Secondary | ICD-10-CM | POA: Insufficient documentation

## 2020-08-02 DIAGNOSIS — F1910 Other psychoactive substance abuse, uncomplicated: Secondary | ICD-10-CM | POA: Insufficient documentation

## 2020-08-02 DIAGNOSIS — F419 Anxiety disorder, unspecified: Secondary | ICD-10-CM

## 2020-08-02 DIAGNOSIS — I10 Essential (primary) hypertension: Secondary | ICD-10-CM

## 2020-08-02 DIAGNOSIS — H5704 Mydriasis: Secondary | ICD-10-CM | POA: Insufficient documentation

## 2020-08-02 DIAGNOSIS — T1490XA Injury, unspecified, initial encounter: Secondary | ICD-10-CM | POA: Insufficient documentation

## 2020-08-02 DIAGNOSIS — F319 Bipolar disorder, unspecified: Secondary | ICD-10-CM

## 2020-08-02 DIAGNOSIS — R509 Fever, unspecified: Secondary | ICD-10-CM | POA: Insufficient documentation

## 2020-08-02 DIAGNOSIS — R000 Tachycardia, unspecified: Secondary | ICD-10-CM | POA: Insufficient documentation

## 2020-08-02 DIAGNOSIS — R52 Pain, unspecified: Secondary | ICD-10-CM | POA: Insufficient documentation

## 2020-08-02 DIAGNOSIS — F29 Unspecified psychosis not due to a substance or known physiological condition: Secondary | ICD-10-CM | POA: Insufficient documentation

## 2020-08-02 MED ORDER — QUETIAPINE FUMARATE 50 MG PO TABS
50.0000 mg | ORAL_TABLET | Freq: Every day | ORAL | Status: DC
  Filled 2020-08-02: qty 1
  Filled 2020-08-02: qty 7
  Filled 2020-08-02: qty 1

## 2020-08-02 MED ORDER — ARIPIPRAZOLE 10 MG PO TABS
10.0000 mg | ORAL_TABLET | Freq: Every day | ORAL | 0 refills | Status: DC
Start: 2020-08-03 — End: 2020-08-14

## 2020-08-02 MED ORDER — TRAZODONE HCL 100 MG PO TABS
100.0000 mg | ORAL_TABLET | Freq: Every day | ORAL | Status: DC
  Filled 2020-08-02: qty 7

## 2020-08-02 MED ORDER — BUSPIRONE HCL 10 MG PO TABS
10.0000 mg | ORAL_TABLET | Freq: Three times a day (TID) | ORAL | 0 refills | Status: DC
Start: 2020-08-02 — End: 2020-08-14

## 2020-08-02 MED ORDER — TRAZODONE HCL 50 MG PO TABS
100.0000 mg | ORAL_TABLET | Freq: Every day | ORAL | 0 refills | Status: DC
Start: 2020-08-02 — End: 2020-08-14

## 2020-08-02 MED ORDER — ARIPIPRAZOLE 10 MG PO TABS
10.0000 mg | ORAL_TABLET | Freq: Every day | ORAL | 0 refills | Status: DC
Start: 2020-08-03 — End: 2020-08-02

## 2020-08-02 MED ORDER — NICOTINE 14 MG/24HR TD PT24
14.0000 mg | MEDICATED_PATCH | Freq: Every day | TRANSDERMAL | Status: DC
  Administered 2020-08-02: 14 mg via TRANSDERMAL
  Filled 2020-08-02: qty 7
  Filled 2020-08-02 (×2): qty 1

## 2020-08-02 MED ORDER — AMOXICILLIN 500 MG PO CAPS: 500.0000 mg | ORAL_CAPSULE | Freq: Three times a day (TID) | ORAL | 0 refills | Status: AC

## 2020-08-02 MED ORDER — QUETIAPINE FUMARATE 50 MG PO TABS
50.0000 mg | ORAL_TABLET | Freq: Every day | ORAL | 0 refills | Status: DC
Start: 2020-08-02 — End: 2020-08-14

## 2020-08-02 MED ORDER — AMLODIPINE BESYLATE 10 MG PO TABS
10.0000 mg | ORAL_TABLET | Freq: Every day | ORAL | 0 refills | Status: DC
Start: 2020-08-02 — End: 2020-08-14

## 2020-08-02 MED ORDER — THIAMINE HCL 100 MG PO TABS: 100.0000 mg | ORAL_TABLET | Freq: Every day | ORAL | 0 refills | Status: DC

## 2020-08-02 MED ORDER — AMLODIPINE BESYLATE 10 MG PO TABS
10.0000 mg | ORAL_TABLET | Freq: Every day | ORAL | Status: DC
  Administered 2020-08-02: 10 mg via ORAL
  Filled 2020-08-02: qty 2
  Filled 2020-08-02: qty 7
  Filled 2020-08-02 (×2): qty 1

## 2020-08-02 MED ORDER — NICOTINE 14 MG/24HR TD PT24
14.0000 mg | MEDICATED_PATCH | Freq: Every day | TRANSDERMAL | 0 refills | Status: DC
Start: 2020-08-03 — End: 2020-08-14

## 2020-08-02 MED ORDER — ADULT MULTIVITAMIN W/MINERALS CH: 1.0000 | ORAL_TABLET | Freq: Every day | ORAL | 0 refills | Status: DC

## 2020-08-02 NOTE — B[facility] Counselor (Signed)
  Clinical Social Work Note  At doctor request, information added to After Visit Summary as follows:           [facility] Follow [facility].   Why: Dr. Sherre Lain can help with Medication-Assisted Therapy if desired, such as Suboxone or Methadone.  Please call or go to the clinic for follow-up if desired.  They provide group counseling also. Call offce manager at [phone] for transportation. Contact information: [address]  [place] [address] Tel:  [phone] Fax number: [phone] Email:  [email or web address]       [facility] Follow up.   Why: This clinic can help with Medication-Assisted Therapy if desired, such as Suboxone or Methadone. Please call or go to the clinic for follow-up if desired. Contact information: [place], [place] [address] Tel:  [phone]             Ambrose Mantle, LCSW 08/02/2020, 1:22 PM

## 2020-08-02 NOTE — [facility] Group Notes (Signed)
[facility] LCSW Group Therapy Note  08/02/2020    Type of Therapy and Topic:  Group Therapy:  Adding Supports Including Yourself  Participation Level:  Active   Description of Group:   Patients in this group were introduced to the concept that additional supports including self-support are an essential part of recovery.  Patients listed what supports they believe they need to add to their lives to achieve their goals at discharge, and they listed such things as therapist, family, doctor, support groups, 12-step groups and service animals.   A song entitled "My Own Hero" was played and a group discussion ensued in which patients stated they could relate to the song and it inspired them to realize they have be willing to help themselves in order to succeed, because other people cannot achieve sobriety or stability for them.  "Fight For It" was played, then "I Am Enough" to encourage patients.  They discussed the impact on them and how they must remain convinced that their lives are worth the effort it takes to become sober and/or stable.  Therapeutic Goals: 1)  demonstrate the importance of being a key part of one's own support system 2)  discuss various available supports 3)  encourage patient to use music as part of their self-support and focus on goals 4)  elicit ideas from patients about supports that need to be added   Summary of Patient Progress:  The patient expressed that his healthy supports include his family, God, his church, and his dog.  Unhealthy supports are his family sometimes, depending "on what they are going through and if they are using."  He stated he himself is an unhealthy self-support when he isolates and/or uses drugs and alcohol.  He was very insightful throughout group and responded well to the music selections, commenting on the meaning of each to him.   Therapeutic Modalities:   Motivational Interviewing Activity  Lance Abbott  9:02 AM

## 2020-08-02 NOTE — Progress Notes (Signed)
°  [facility] Adult Case Management Discharge Plan :  Will you be returning to the same living situation after discharge:  Yes,  to home with father At discharge, do you have transportation home?: No. CSW will arrange transportation Do you have the ability to pay for your medications: No. Samples to be provided at [email or web address]  Release of information consent forms completed and in the chart;  Patient's signature needed at discharge.  Patient to Follow up at:  Follow-up Information    [facility], [facility]. Go on 08/05/2020.   Why: An appointment has been scheduled for you on Wednesday 08/05/20 at 9am. Contact information: [address] [place] [address] [phone]        [facility] Go on 08/04/2020.   Why: You have a screening appointment on 08/04/20 at 9:00 am to obtain appointment for services for back pain with this provider. * PLEASE CALL FOR APPT DETAILS *.  Appointment location & phone #[phone] [facility] at [address] [place], [place])  Contact information: [address] [place] [address] [phone]       [facility]. Go on 09/07/2020.   Why: You have an appointment to establish care for dental services on 09/07/20 at 9:00 am, in person. The provider's sliding scale fee range is $135 - $225 (not including treatment).  Please bring proof of any income and photo ID and payment for services. Contact information: [address] [place] [address] [phone]        [facility] Call.   Specialty: Addiction Medicine Why: A referral has been made on your behalf. Please call daily to check bed availability.  Contact information: [address] [place] [address] [phone]        [facility] Call.   Why: A referral has been made on your behalf.  Please call daily to check bed availability.  Contact information: [address] [place] [address] [phone]               Next level of care provider has access to [facility] Link:no  Safety Planning and Suicide Prevention discussed: Yes,  with patient     Has patient been referred to the Quitline?: Patient refused referral  Patient has been referred for addiction treatment: Yes  Otelia Santee, LCSW 08/02/2020, 12:30 PM
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
## 2020-08-02 NOTE — Progress Notes (Signed)
The patient denies SI/HI. The patient received both written and verbal discharge instructions. Patient verbalizes understanding of discharge instructions. Patient agreed to f/u appt and med regimen. Patient received both written and verbal discharge instructions. Patient received an AVS, transitional record, SRA, prescriptions and sample meds. Pt received belongings from room and search room. Pt safely discharged to the lobby and transported via [employer].

## 2020-08-11 ENCOUNTER — Other Ambulatory Visit: Payer: Self-pay

## 2020-08-11 ENCOUNTER — Emergency Department (HOSPITAL_COMMUNITY)
Admission: EM | Admit: 2020-08-11 | Discharge: 2020-08-11 | Disposition: A | Discharge status: Home / Self Care | Payer: Self-pay | Attending: Emergency Medicine | Admitting: Emergency Medicine

## 2020-08-11 ENCOUNTER — Encounter (HOSPITAL_COMMUNITY): Payer: Self-pay

## 2020-08-11 DIAGNOSIS — F1910 Other psychoactive substance abuse, uncomplicated: Secondary | ICD-10-CM | POA: Insufficient documentation

## 2020-08-11 DIAGNOSIS — F1029 Alcohol dependence with unspecified alcohol-induced disorder: Secondary | ICD-10-CM | POA: Insufficient documentation

## 2020-08-11 DIAGNOSIS — Z79899 Other long term (current) drug therapy: Secondary | ICD-10-CM | POA: Insufficient documentation

## 2020-08-11 DIAGNOSIS — F17210 Nicotine dependence, cigarettes, uncomplicated: Secondary | ICD-10-CM | POA: Insufficient documentation

## 2020-08-11 DIAGNOSIS — I10 Essential (primary) hypertension: Secondary | ICD-10-CM | POA: Insufficient documentation

## 2020-08-11 DIAGNOSIS — J45909 Unspecified asthma, uncomplicated: Secondary | ICD-10-CM | POA: Insufficient documentation

## 2020-08-11 LAB — RAPID URINE DRUG SCREEN, HOSP PERFORMED
Amphetamines: NOT DETECTED
Barbiturates: NOT DETECTED
Benzodiazepines: POSITIVE — AB
Cocaine: NOT DETECTED
Opiates: NOT DETECTED
Tetrahydrocannabinol: POSITIVE — AB

## 2020-08-11 LAB — ETHANOL: Alcohol, Ethyl (B): <10 mg/dL (ref <10)

## 2020-08-11 MED ORDER — LORAZEPAM 1 MG PO TABS
1.0000 mg | ORAL_TABLET | Freq: Once | ORAL | Status: AC
  Administered 2020-08-11: 1 mg via ORAL
  Filled 2020-08-11: qty 1

## 2020-08-11 MED ORDER — ACETAMINOPHEN 325 MG PO TABS: 650.0000 mg | ORAL_TABLET | ORAL | Status: DC | PRN

## 2020-08-11 MED ORDER — LORAZEPAM 2 MG/ML IJ SOLN: 0.0000 mg | Freq: Two times a day (BID) | INTRAMUSCULAR | Status: DC

## 2020-08-11 MED ORDER — CHLORDIAZEPOXIDE HCL 25 MG PO CAPS: 25.0000 mg | ORAL_CAPSULE | Freq: Four times a day (QID) | ORAL | 0 refills | Status: DC | PRN

## 2020-08-11 MED ORDER — LORAZEPAM 1 MG PO TABS: 0.0000 mg | ORAL_TABLET | Freq: Two times a day (BID) | ORAL | Status: DC

## 2020-08-11 MED ORDER — THIAMINE HCL 100 MG PO TABS
100.0000 mg | ORAL_TABLET | Freq: Every day | ORAL | Status: DC
  Administered 2020-08-11: 100 mg via ORAL
  Filled 2020-08-11: qty 1

## 2020-08-11 MED ORDER — THIAMINE HCL 100 MG/ML IJ SOLN: 100.0000 mg | Freq: Every day | INTRAMUSCULAR | Status: DC

## 2020-08-11 NOTE — ED Notes (Signed)
tts in progress 

## 2020-08-11 NOTE — Discharge Instructions (Signed)
Mr. Wildeman, it is a pleasure getting to know you today.  You came to the ED for alcohol withdrawal symptoms.  Your blood alcohol level is negative.  Urine drug screen shows marijuana and benzodiazepine.  I will prescribe Librium taper, please take them as instructed.  Also follow-up with [facility] in [place].  Call them if you do not hear back from the facility.  Come back to the ED for worsening symptoms.

## 2020-08-11 NOTE — ED Triage Notes (Signed)
Pt presents to ED with complaints of alcohol withdrawal. Pt states he last drank yesterday, "as much and as fast as he can." Pt states he went to [facility] and was transferred to [facility] because his blood pressure was in the 200's but he signed out AMA because his ride was leaving him. Pt states he took one of his dad's BP pills last night. Pt denies SI/HI.

## 2020-08-11 NOTE — ED Notes (Signed)
Last drink was last night. Couple of 40 oz. States normally drinks pint of alcohol and 40 oz everyday

## 2020-08-11 NOTE — BH Assessment (Signed)
Comprehensive Clinical Assessment (CCA) Screening, Triage and Referral Note  08/11/2020 Lance Abbott [id number]  Patient is a 39 y.o. male with a history of Opiate Use Disorder and Alcohol Use Disorder who presented to [facility] reporting alcohol withdrawal symptoms. Yesterday, patient presented to [facility] in [place] for SA treatment and was transferred to [facility] due to elevated BP over 200.  He reports he signed out AMA from the ED as his ride was leaving him.  He then went home, took one of his father's BP medications and then returned to [facility].  He is requesting detox treatment.  Patient denies SI, HI and AVH.  He reports he relapsed on 9/26, the day he was discharged from [facility].  He admits to drinking heavily, cocaine use, THC use and snorting heroin mixed with meth since 9/26. He states his follow up recommendation at [facility] was to attend AA groups and follow up with Susa Day for medication management.  He states he "just fell off" and stared using soon after discharge.  Patient reports he has a history of two suicide attempts, with the most recent two years ago after his wife died from a heroin overdose.  Patient states he is still grieving this loss.  He denies SI currently and he is able to affirm his safety in a treatment setting.  He denies HI and AVH.   Per patient's father, Lance Abbott, they have been trying to get patient into SA treatment for the past two weeks.  He was with patient at [facility] in [facility] yesterday when they transferred patient the the ER.  He has no safety concerns.  He is hopeful that patient will be admitted to [facility] for treatment.   Per Denzil Magnuson, NP patient is psych cleared.  He will be referred to [facility] for SA treatment.    At 1135 - Per RN Katie, patient was discharged and was picked up by a friend for transport to [facility].   Visit Diagnosis:    ICD-10-CM   1. Substance abuse (HCC)  F19.10   2. Alcohol dependence with  unspecified alcohol-induced disorder [facility])  F10.29     Patient Reported Information How did you hear about [place]? Self   Referral name: No data recorded  Referral phone number: No data recorded Whom do you see for routine medical problems? I don't have a doctor   Practice/Facility Name: No data recorded  Practice/Facility Phone Number: No data recorded  Name of Contact: No data recorded  Contact Number: No data recorded  Contact Fax Number: No data recorded  Prescriber Name: No data recorded  Prescriber Address (if known): No data recorded What Is the Reason for Your Visit/Call Today? Patient presents to [facility] requesting detox from alcohol, heroin and meth after her relapsed on 9/26 following d/c from [facility].  How Long Has This Been Causing You Problems? 1 wk - 1 month  Have You Recently Been in Any Inpatient Treatment (Hospital/Detox/Crisis Center/28-Day Program)? Yes   Name/Location of Program/Hospital:[facility]   How Long Were You There? 5 days   When Were You Discharged? 08/02/20  Have You Ever Received Services From [employer] [facility] Before? Yes   Who Do You See at [facility]? inpt treatment  Have You Recently Had Any Thoughts About Hurting Yourself? No   Are You Planning to Commit Suicide/Harm Yourself At This time?  No  Have you Recently Had Thoughts About Hurting Someone Lance Abbott? No   Explanation: No data recorded Have You Used Any Alcohol  or Drugs in the Past 24 Hours? Yes   How Long Ago Did You Use Drugs or Alcohol?  No data recorded  What Did You Use and How Much? Patient reports drinking heavily since 9/26. He has also been using unknown amount of heroin/meth and THC daily.  What Do You Feel Would Help You the Most Today? Medication;Therapy  Do You Currently Have a Therapist/Psychiatrist? No   Name of Therapist/Psychiatrist: No data recorded  Have You Been Recently Discharged From Any Office Practice or Programs? No   Explanation of Discharge From Practice/Program:   No data recorded    CCA Screening Triage Referral Assessment Type of Contact: Tele-Assessment   Is this Initial or Reassessment? Initial Assessment   Date Telepsych consult ordered in [facility]:  08/11/20   Time Telepsych consult ordered in [facility]:  0939  Patient Reported Information Reviewed? Yes   Patient Left Without Being Seen? No data recorded  Reason for Not Completing Assessment: No data recorded Collateral Involvement: Unable  Does Patient Have a Court Appointed Legal Guardian? No data recorded  Name and Contact of Legal Guardian:  NA  If Minor and Not Living with Parent(s), Who has Custody? NA  Is CPS involved or ever been involved? Never  Is APS involved or ever been involved? Never  Patient Determined To Be At Risk for Harm To Self or Others Based on Review of Patient Reported Information or Presenting Complaint? No   Method: No data recorded  Availability of Means: No data recorded  Intent: No data recorded  Notification Required: No data recorded  Additional Information for Danger to Others Potential:  No data recorded  Additional Comments for Danger to Others Potential:  No data recorded  Are There Guns or Other Weapons in Your Home?  No data recorded   Types of Guns/Weapons: No data recorded   Are These Weapons Safely Secured?                              No data recorded   Who Could Verify You Are Able To Have These Secured:    No data recorded Do You Have any Outstanding Charges, Pending Court Dates, Parole/Probation? No data recorded Contacted To Inform of Risk of Harm To Self or Others: Other: Comment (father is aware of patient's current thought processes)  Location of Assessment: [facility]  Does Patient Present under Involuntary Commitment? No   IVC Papers Initial File Date: No data recorded  [place] of Residence: [place]  Patient Currently Receiving the Following Services: Not Receiving Services   Determination of Need: Routine (7 days)   Options  For Referral: Other: Comment (Patient will be referred to [facility] for detox treatment.)   Yetta Glassman
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
## 2020-08-12 ENCOUNTER — Ambulatory Visit: Payer: Self-pay | Admitting: Physician Assistant

## 2020-08-13 ENCOUNTER — Ambulatory Visit (HOSPITAL_COMMUNITY)
Admission: AD | Admit: 2020-08-13 | Discharge: 2020-08-13 | Disposition: A | Discharge status: Home / Self Care | Payer: Federal, State, Local not specified - Other | Attending: Psychiatry | Admitting: Psychiatry

## 2020-08-13 DIAGNOSIS — F1110 Opioid abuse, uncomplicated: Secondary | ICD-10-CM | POA: Insufficient documentation

## 2020-08-13 DIAGNOSIS — F1010 Alcohol abuse, uncomplicated: Secondary | ICD-10-CM | POA: Insufficient documentation

## 2020-08-13 DIAGNOSIS — R45851 Suicidal ideations: Secondary | ICD-10-CM | POA: Diagnosis not present

## 2020-08-13 DIAGNOSIS — F32A Depression, unspecified: Secondary | ICD-10-CM | POA: Insufficient documentation
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
## 2020-08-14 ENCOUNTER — Ambulatory Visit (HOSPITAL_COMMUNITY)
Admission: RE | Admit: 2020-08-14 | Discharge: 2020-08-14 | Disposition: A | Discharge status: Home / Self Care | Payer: No Typology Code available for payment source | Attending: Psychiatry | Admitting: Psychiatry

## 2020-08-14 ENCOUNTER — Encounter (HOSPITAL_COMMUNITY): Payer: Self-pay | Admitting: Emergency Medicine

## 2020-08-14 ENCOUNTER — Ambulatory Visit (HOSPITAL_COMMUNITY)
Admission: EM | Admit: 2020-08-14 | Discharge: 2020-08-14 | Disposition: A | Discharge status: Home / Self Care | Payer: No Payment, Other | Attending: Nurse Practitioner | Admitting: Nurse Practitioner

## 2020-08-14 ENCOUNTER — Other Ambulatory Visit: Payer: Self-pay

## 2020-08-14 DIAGNOSIS — R45851 Suicidal ideations: Secondary | ICD-10-CM | POA: Insufficient documentation

## 2020-08-14 DIAGNOSIS — F319 Bipolar disorder, unspecified: Secondary | ICD-10-CM | POA: Insufficient documentation

## 2020-08-14 DIAGNOSIS — F1120 Opioid dependence, uncomplicated: Secondary | ICD-10-CM | POA: Insufficient documentation

## 2020-08-14 DIAGNOSIS — J45909 Unspecified asthma, uncomplicated: Secondary | ICD-10-CM | POA: Insufficient documentation

## 2020-08-14 DIAGNOSIS — Z20822 Contact with and (suspected) exposure to covid-19: Secondary | ICD-10-CM | POA: Insufficient documentation

## 2020-08-14 DIAGNOSIS — Z79899 Other long term (current) drug therapy: Secondary | ICD-10-CM | POA: Insufficient documentation

## 2020-08-14 DIAGNOSIS — F419 Anxiety disorder, unspecified: Secondary | ICD-10-CM | POA: Insufficient documentation

## 2020-08-14 DIAGNOSIS — F1020 Alcohol dependence, uncomplicated: Secondary | ICD-10-CM | POA: Insufficient documentation

## 2020-08-14 DIAGNOSIS — F17210 Nicotine dependence, cigarettes, uncomplicated: Secondary | ICD-10-CM | POA: Insufficient documentation

## 2020-08-14 DIAGNOSIS — G8929 Other chronic pain: Secondary | ICD-10-CM | POA: Insufficient documentation

## 2020-08-14 LAB — POCT URINE DRUG SCREEN - MANUAL ENTRY (I-SCREEN)
POC Amphetamine UR: NOT DETECTED
POC Buprenorphine (BUP): NOT DETECTED
POC Cocaine UR: NOT DETECTED
POC Marijuana UR: POSITIVE — AB
POC Methadone UR: NOT DETECTED
POC Methamphetamine UR: NOT DETECTED
POC Morphine: NOT DETECTED
POC Oxazepam (BZO): POSITIVE — AB
POC Oxycodone UR: NOT DETECTED
POC Secobarbital (BAR): NOT DETECTED

## 2020-08-14 LAB — COMPREHENSIVE METABOLIC PANEL
ALT: 17 U/L (ref 0–44)
AST: 21 U/L (ref 15–41)
Albumin: 4.4 g/dL (ref 3.5–5.0)
Alkaline Phosphatase: 68 U/L (ref 38–126)
Anion gap: 13 (ref 5–15)
BUN: 14 mg/dL (ref 6–20)
CO2: 22 mmol/L (ref 22–32)
Calcium: 9.5 mg/dL (ref 8.9–10.3)
Chloride: 103 mmol/L (ref 98–111)
Creatinine, Ser: 0.82 mg/dL (ref 0.61–1.24)
GFR calc non Af Amer: >60 mL/min (ref >60)
Glucose, Bld: 80 mg/dL (ref 70–99)
Potassium: 3.8 mmol/L (ref 3.5–5.1)
Sodium: 138 mmol/L (ref 135–145)
Total Bilirubin: 0.4 mg/dL (ref 0.3–1.2)
Total Protein: 7.5 g/dL (ref 6.5–8.1)

## 2020-08-14 LAB — RESPIRATORY PANEL BY RT PCR (FLU A&B, COVID)
Influenza A by PCR: NEGATIVE
Influenza B by PCR: NEGATIVE
SARS Coronavirus 2 by RT PCR: NEGATIVE

## 2020-08-14 LAB — CBC WITH DIFFERENTIAL/PLATELET
Abs Immature Granulocytes: 0.05 10*3/uL (ref 0.00–0.07)
Basophils Absolute: 0.1 10*3/uL (ref 0.0–0.1)
Basophils Relative: 1 %
Eosinophils Absolute: 0.2 10*3/uL (ref 0.0–0.5)
Eosinophils Relative: 2 %
HCT: 45.5 % (ref 39.0–52.0)
Hemoglobin: 15 g/dL (ref 13.0–17.0)
Immature Granulocytes: 1 %
Lymphocytes Relative: 33 %
Lymphs Abs: 3.1 10*3/uL (ref 0.7–4.0)
MCH: 30.7 pg (ref 26.0–34.0)
MCHC: 33 g/dL (ref 30.0–36.0)
MCV: 93.2 fL (ref 80.0–100.0)
Monocytes Absolute: 0.6 10*3/uL (ref 0.1–1.0)
Monocytes Relative: 7 %
Neutro Abs: 5.3 10*3/uL (ref 1.7–7.7)
Neutrophils Relative %: 56 %
Platelets: 338 10*3/uL (ref 150–400)
RBC: 4.88 MIL/uL (ref 4.22–5.81)
RDW: 13.1 % (ref 11.5–15.5)
WBC: 9.4 10*3/uL (ref 4.0–10.5)
nRBC: 0 % (ref 0.0–0.2)

## 2020-08-14 LAB — ETHANOL: Alcohol, Ethyl (B): 25 mg/dL — ABNORMAL HIGH (ref <10)

## 2020-08-14 LAB — POC SARS CORONAVIRUS 2 AG: SARS Coronavirus 2 Ag: NEGATIVE

## 2020-08-14 LAB — POC SARS CORONAVIRUS 2 AG -  ED: SARS Coronavirus 2 Ag: NEGATIVE

## 2020-08-14 MED ORDER — AMLODIPINE BESYLATE 10 MG PO TABS
10.0000 mg | ORAL_TABLET | Freq: Every day | ORAL | Status: DC
  Administered 2020-08-14: 10 mg via ORAL
  Filled 2020-08-14: qty 7
  Filled 2020-08-14: qty 1

## 2020-08-14 MED ORDER — QUETIAPINE FUMARATE 100 MG PO TABS
100.0000 mg | ORAL_TABLET | Freq: Every day | ORAL | Status: DC
  Administered 2020-08-14: 100 mg via ORAL
  Filled 2020-08-14: qty 1
  Filled 2020-08-14 (×2): qty 7

## 2020-08-14 MED ORDER — AMLODIPINE BESYLATE 10 MG PO TABS
10.0000 mg | ORAL_TABLET | Freq: Every day | ORAL | 0 refills | Status: DC
Start: 2020-08-14 — End: 2020-11-02

## 2020-08-14 MED ORDER — MAGNESIUM HYDROXIDE 400 MG/5ML PO SUSP: 30.0000 mL | Freq: Every day | ORAL | Status: DC | PRN

## 2020-08-14 MED ORDER — CLONIDINE HCL 0.1 MG PO TABS: 0.1000 mg | ORAL_TABLET | ORAL | Status: DC

## 2020-08-14 MED ORDER — TRAZODONE HCL 50 MG PO TABS
100.0000 mg | ORAL_TABLET | Freq: Every day | ORAL | 0 refills | Status: DC
Start: 2020-08-14 — End: 2020-11-02

## 2020-08-14 MED ORDER — CHLORDIAZEPOXIDE HCL 25 MG PO CAPS
25.0000 mg | ORAL_CAPSULE | Freq: Once | ORAL | Status: AC
  Administered 2020-08-14: 25 mg via ORAL
  Filled 2020-08-14: qty 1

## 2020-08-14 MED ORDER — ACETAMINOPHEN 325 MG PO TABS
650.0000 mg | ORAL_TABLET | Freq: Four times a day (QID) | ORAL | Status: DC | PRN
  Administered 2020-08-14 (×2): 650 mg via ORAL
  Filled 2020-08-14 (×2): qty 2

## 2020-08-14 MED ORDER — HYDROXYZINE HCL 25 MG PO TABS: 25.0000 mg | ORAL_TABLET | Freq: Four times a day (QID) | ORAL | Status: DC | PRN

## 2020-08-14 MED ORDER — NICOTINE 14 MG/24HR TD PT24
14.0000 mg | MEDICATED_PATCH | Freq: Every day | TRANSDERMAL | Status: DC
  Filled 2020-08-14: qty 1

## 2020-08-14 MED ORDER — CHLORDIAZEPOXIDE HCL 25 MG PO CAPS
25.0000 mg | ORAL_CAPSULE | Freq: Four times a day (QID) | ORAL | Status: DC | PRN
  Administered 2020-08-14: 25 mg via ORAL
  Filled 2020-08-14: qty 1

## 2020-08-14 MED ORDER — CLONIDINE HCL 0.1 MG PO TABS: 0.1000 mg | ORAL_TABLET | Freq: Every day | ORAL | Status: DC

## 2020-08-14 MED ORDER — DICYCLOMINE HCL 20 MG PO TABS: 20.0000 mg | ORAL_TABLET | Freq: Four times a day (QID) | ORAL | Status: DC | PRN

## 2020-08-14 MED ORDER — BUSPIRONE HCL 10 MG PO TABS
10.0000 mg | ORAL_TABLET | Freq: Three times a day (TID) | ORAL | Status: DC
  Administered 2020-08-14: 10 mg via ORAL
  Filled 2020-08-14 (×2): qty 21
  Filled 2020-08-14: qty 2
  Filled 2020-08-14 (×4): qty 21

## 2020-08-14 MED ORDER — TRAZODONE HCL 50 MG PO TABS
100.0000 mg | ORAL_TABLET | Freq: Every day | ORAL | Status: DC
  Filled 2020-08-14: qty 14

## 2020-08-14 MED ORDER — BUSPIRONE HCL 10 MG PO TABS
10.0000 mg | ORAL_TABLET | Freq: Three times a day (TID) | ORAL | 0 refills | Status: AC
Start: 2020-08-14 — End: 2020-09-13

## 2020-08-14 MED ORDER — NICOTINE 21 MG/24HR TD PT24
21.0000 mg | MEDICATED_PATCH | Freq: Every day | TRANSDERMAL | Status: DC
  Administered 2020-08-14: 21 mg via TRANSDERMAL
  Filled 2020-08-14 (×2): qty 1

## 2020-08-14 MED ORDER — THIAMINE HCL 100 MG PO TABS
100.0000 mg | ORAL_TABLET | Freq: Every day | ORAL | Status: DC
  Administered 2020-08-14: 100 mg via ORAL
  Filled 2020-08-14: qty 1

## 2020-08-14 MED ORDER — LOPERAMIDE HCL 2 MG PO CAPS: 2.0000 mg | ORAL_CAPSULE | ORAL | Status: DC | PRN

## 2020-08-14 MED ORDER — METHOCARBAMOL 500 MG PO TABS
500.0000 mg | ORAL_TABLET | Freq: Three times a day (TID) | ORAL | Status: DC | PRN
  Administered 2020-08-14 (×2): 500 mg via ORAL
  Filled 2020-08-14 (×2): qty 1

## 2020-08-14 MED ORDER — ONDANSETRON 4 MG PO TBDP: 4.0000 mg | ORAL_TABLET | Freq: Four times a day (QID) | ORAL | Status: DC | PRN

## 2020-08-14 MED ORDER — CLONIDINE HCL 0.1 MG PO TABS
0.1000 mg | ORAL_TABLET | Freq: Four times a day (QID) | ORAL | Status: DC
  Administered 2020-08-14 (×2): 0.1 mg via ORAL
  Filled 2020-08-14 (×2): qty 1

## 2020-08-14 MED ORDER — QUETIAPINE FUMARATE 100 MG PO TABS
100.0000 mg | ORAL_TABLET | Freq: Every day | ORAL | 0 refills | Status: DC
Start: 2020-08-14 — End: 2020-11-02

## 2020-08-14 MED ORDER — ALUM & MAG HYDROXIDE-SIMETH 200-200-20 MG/5ML PO SUSP: 30.0000 mL | ORAL | Status: DC | PRN

## 2020-08-14 NOTE — ED Notes (Signed)
Pt A&O x 4, no distress noted, presents with complaint of suicidal ideations, ,plan to cut self.  Denies HI or AVH.  Pt reports he abuses alcohol and Fentanyl.  Pt requesting Detox also.  Pt calm & cooperative,  Skin search completed, monitoring for safety.
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
## 2020-08-14 NOTE — ED Provider Notes (Signed)
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 MCHC 06/28/2020 32.8  30.0 - 36.0 g/dL Final  . RDW 50/27/7412 13.2  11.5 - 15.5 % Final  . Platelets 06/28/2020 368  150 - 400 K/uL Final  . nRBC 06/28/2020 0.0  0.0  - 0.2 % Final  . Neutrophils Relative % 06/28/2020 61  % Final  . Neutro Abs 06/28/2020 6.6  1.7 - 7.7 K/uL Final  .  Lymphocytes Relative 06/28/2020 28  % Final  . Lymphs Abs 06/28/2020 3.0  0.7 - 4.0 K/uL Final  . Monocytes Relative 06/28/2020 7  % Final  . Monocytes Absolute 06/28/2020 0.7  0.1 - 1.0 K/uL Final  . Eosinophils Relative 06/28/2020 2  % Final  . Eosinophils Absolute 06/28/2020 0.2  0 - 0 K/uL Final  . Basophils Relative 06/28/2020 1  % Final  . Basophils Absolute 06/28/2020 0.1  0 - 0 K/uL Final  . Immature Granulocytes 06/28/2020 1  % Final  . Abs Immature Granulocytes 06/28/2020 0.05  0.00 - 0.07 K/uL Final   Performed at [facility], [address], [place], [place] [address]  . Sodium 06/28/2020 139  135 - 145 mmol/L Final  . Potassium 06/28/2020 3.8  3.5 - 5.1 mmol/L Final  . Chloride 06/28/2020 99  98 - 111 mmol/L Final  . CO2 06/28/2020 28  22 - 32 mmol/L Final  . Glucose, Bld 06/28/2020 95  70 - 99 mg/dL Final   Glucose reference range applies only to samples taken after fasting for at least 8 hours.  . BUN 06/28/2020 11  6 - 20 mg/dL Final  . Creatinine, Ser 06/28/2020 0.71  0.61 - 1.24 mg/dL Final  . Calcium 60/45/4098 9.6  8.9 - 10.3 mg/dL Final  . Total Protein 06/28/2020 7.8  6.5 - 8.1 g/dL Final  . Albumin 11/91/4782 4.6  3.5 - 5.0 g/dL Final  . AST 95/62/1308 25  15 - 41 U/L Final  . ALT 06/28/2020 26  0 - 44 U/L Final  . Alkaline Phosphatase 06/28/2020 79  38 - 126 U/L Final  . Total Bilirubin 06/28/2020 0.4  0.3 - 1.2 mg/dL Final  . GFR calc non Af Amer 06/28/2020 >60  >60 mL/min Final  . GFR calc Af Amer 06/28/2020 >60  >60 mL/min Final  . Anion gap 06/28/2020 12  5 - 15 Final   Performed at [facility], [address], [place], [place] [address]  . Alcohol, Ethyl (B) 06/28/2020 <10  <10 mg/dL Final   Comment: (NOTE) Lowest detectable limit for serum alcohol is 10 mg/dL.  For medical purposes only. Performed at [facility], [address],  [place], [place] [address]   . Opiates 06/28/2020 NONE DETECTED  NONE DETECTED Final  . Cocaine 06/28/2020 NONE DETECTED  NONE DETECTED Final  . Benzodiazepines 06/28/2020 NONE DETECTED  NONE DETECTED Final  . Amphetamines 06/28/2020 NONE DETECTED  NONE DETECTED Final  . Tetrahydrocannabinol 06/28/2020 POSITIVE* NONE DETECTED Final  . Barbiturates 06/28/2020 NONE DETECTED  NONE DETECTED Final   Comment: (NOTE) DRUG SCREEN FOR MEDICAL PURPOSES ONLY.  IF CONFIRMATION IS NEEDED FOR ANY PURPOSE, NOTIFY LAB WITHIN 5 DAYS.  LOWEST DETECTABLE LIMITS FOR URINE DRUG SCREEN Drug Class                     Cutoff (ng/mL) Amphetamine and metabolites    1000 Barbiturate and metabolites    200 Benzodiazepine                 200 Tricyclics and metabolites     300 Opiates and metabolites        300 Cocaine and metabolites        300 THC                            50 Performed at [facility], [address] [place], [place] [address]   .  SARS Coronavirus 2 06/28/2020 NEGATIVE  NEGATIVE Final   Comment: (NOTE) SARS-CoV-2 target nucleic acids are NOT DETECTED.  The SARS-CoV-2 RNA is generally detectable in upper and lower respiratory specimens during the acute phase of infection. The lowest concentration of SARS-CoV-2 viral copies this assay can detect is 250 copies / mL. A negative result does not preclude SARS-CoV-2 infection and should not be used as the sole basis for treatment or other patient management decisions.  A negative result may occur with improper specimen collection / handling, submission of specimen other than nasopharyngeal swab, presence of viral mutation(s) within the areas targeted by this assay, and inadequate number of viral copies (<250 copies / mL). A negative result must be combined with clinical observations, patient history, and epidemiological information.  Fact Sheet for Patients:   BoilerBrush.com.cyhttps://www.fda.gov/media/136312/download  Fact Sheet for Healthcare  Providers: https://pope.com/https://www.fda.gov/media/136313/download  This test is not yet approved or                           cleared by the [place] FDA and has been authorized for detection and/or diagnosis of SARS-CoV-2 by FDA under an Emergency Use Authorization (EUA).  This EUA will remain in effect (meaning this test can be used) for the duration of the COVID-19 declaration under Section 564(b)(1) of the Act, 21 U.S.C. section 360bbb-3(b)(1), unless the authorization is terminated or revoked sooner.  Performed at [facility], [address] [place], [place] [address]   Admission on 06/23/2020, Discharged on 06/24/2020  Component Date Value Ref Range Status  . WBC 06/24/2020 14.5* 4.0 - 10.5 K/uL Final  . RBC 06/24/2020 5.27  4.22 - 5.81 MIL/uL Final  . Hemoglobin 06/24/2020 16.4  13.0 - 17.0 g/dL Final  . HCT 60/45/409808/18/2021 48.7  39 - 52 % Final  . MCV 06/24/2020 92.4  80.0 - 100.0 fL Final  . MCH 06/24/2020 31.1  26.0 - 34.0 pg Final  . MCHC 06/24/2020 33.7  30.0 - 36.0 g/dL Final  . RDW 11/91/478208/18/2021 13.4  11.5 - 15.5 % Final  . Platelets 06/24/2020 411* 150 - 400 K/uL Final  . nRBC 06/24/2020 0.0  0.0 - 0.2 % Final  . Neutrophils Relative % 06/24/2020 67  % Final  . Neutro Abs 06/24/2020 9.7* 1.7 - 7.7 K/uL Final  . Lymphocytes Relative 06/24/2020 23  % Final  . Lymphs Abs 06/24/2020 3.3  0.7 - 4.0 K/uL Final  . Monocytes Relative 06/24/2020 9  % Final  . Monocytes Absolute 06/24/2020 1.3* 0.1 - 1.0 K/uL Final  . Eosinophils Relative 06/24/2020 0  % Final  . Eosinophils Absolute 06/24/2020 0.1  0 - 0 K/uL Final  . Basophils Relative 06/24/2020 1  % Final  . Basophils Absolute 06/24/2020 0.1  0 - 0 K/uL Final  . Immature Granulocytes 06/24/2020 0  % Final  . Abs Immature Granulocytes 06/24/2020 0.06  0.00 - 0.07 K/uL Final   Performed at [facility], [address] [place], [place] [address]  . Sodium 06/24/2020 138  135 - 145 mmol/L Final  . Potassium 06/24/2020 3.4* 3.5 - 5.1  mmol/L Final  . Chloride 06/24/2020 99  98 - 111 mmol/L Final  . CO2 06/24/2020 24  22 - 32 mmol/L Final  . Glucose, Bld 06/24/2020 96  70 - 99 mg/dL Final   Glucose reference range applies only to samples taken after fasting for at least 8 hours.  . BUN 06/24/2020 12  6 - 20  mg/dL Final  . Creatinine, Ser 06/24/2020 1.05  0.61 - 1.24 mg/dL Final  . Calcium 16/08/9603 9.9  8.9 - 10.3 mg/dL Final  . GFR calc non Af Amer 06/24/2020 >60  >60 mL/min Final  . GFR calc Af Amer 06/24/2020 >60  >60 mL/min Final  . Anion gap 06/24/2020 15  5 - 15 Final   Performed at [facility], [address], [place], [place] [address]  . Opiates 06/24/2020 NONE DETECTED  NONE DETECTED Final  . Cocaine 06/24/2020 POSITIVE* NONE DETECTED Final  . Benzodiazepines 06/24/2020 NONE DETECTED  NONE DETECTED Final  . Amphetamines 06/24/2020 POSITIVE* NONE DETECTED Final  . Tetrahydrocannabinol 06/24/2020 POSITIVE* NONE DETECTED Final  . Barbiturates 06/24/2020 NONE DETECTED  NONE DETECTED Final   Comment: (NOTE) DRUG SCREEN FOR MEDICAL PURPOSES ONLY.  IF CONFIRMATION IS NEEDED FOR ANY PURPOSE, NOTIFY LAB WITHIN 5 DAYS.  LOWEST DETECTABLE LIMITS FOR URINE DRUG SCREEN Drug Class                     Cutoff (ng/mL) Amphetamine and metabolites    1000 Barbiturate and metabolites    200 Benzodiazepine                 200 Tricyclics and metabolites     300 Opiates and metabolites        300 Cocaine and metabolites        300 THC                            50 Performed at [facility], [address] [place], [place] [address]   . Troponin I (High Sensitivity) 06/24/2020 8  <18 ng/L Final   Comment: (NOTE) Elevated high sensitivity troponin I (hsTnI) values and significant  changes across serial measurements may suggest ACS but many other  chronic and acute conditions are known to elevate hsTnI results.  Refer to the "Links" section for chest pain algorithms and additional  guidance. Performed at [facility], [address], [place], [place] [address]   . Alcohol, Ethyl (B) 06/24/2020 10* <10 mg/dL Final   Comment: (NOTE) Lowest detectable limit for serum alcohol is 10 mg/dL.  For medical purposes only. Performed at [facility], [address], [place], [place] [address]   . Troponin I (High Sensitivity) 06/24/2020 8  <18 ng/L Final   Comment: (NOTE) Elevated high sensitivity troponin I (hsTnI) values and significant  changes across serial measurements may suggest ACS but many other  chronic and acute conditions are known to elevate hsTnI results.  Refer to the "Links" section for chest pain algorithms and additional  guidance. Performed at [facility], [address], [place], [place] [address]     Allergies: Cogentin [benztropine], Zyprexa [olanzapine], Celexa [citalopram hydrobromide], Depakote [valproic acid], Effexor [venlafaxine], Geodon [ziprasidone hcl], Haldol [haloperidol], Hydrochlorothiazide, Lasix [furosemide], Lexapro [escitalopram], Lisinopril, Lithium, Neurontin [gabapentin], Thorazine [chlorpromazine], Topamax [topiramate], Toradol [ketorolac tromethamine], and Tramadol  PTA Medications: (Not in a hospital admission)   Medical Decision Making  Initiate librium protocol with librium 25 mg every 6 hours prn for CIWA >10 Initiate Clonidine detox protocol    Continue home medications Seroquel 100 mg QHS for mood stability/sleep Amlodipine 10 mg Daily for HTN Buspar 10 mg TID for anxiety Thiamine 100 mg daily for nutritional supplementation MVI daily for nutritional supplementation   Clinical Course as of Aug 15 327  Fri Aug 14, 2020  0321 BAL is 25,  which is low considering the amount of alcohol that he reportedly drank today.  Alcohol, Ethyl (B)(!): 25 [JB]  0322 CBC unremarkable  CBC with Differential/Platelet [JB]  0323 UDS positive for THC and benzodiazepines  POCT Urine Drug Screen - (ICup)(!) [JB]  0323 CMP unremarkable  Comprehensive metabolic panel  [JB]    Clinical Course User Index [JB] Jackelyn Poling, NP    Recommendations  Based on my evaluation the patient does not appear to have an emergency medical condition.   Patient will be placed in the continuous assessment area at [facility] for treatment and stabilization. He will be reevaluated on 08/14/2020. The treatment team will determine disposition at that time.      Jackelyn Poling, NP 08/14/20  3:28 AM

## 2020-08-14 NOTE — ED Notes (Signed)
Pt discharged out of system, duplicate chart.  AC Aliene Altes notified.

## 2020-08-14 NOTE — ED Notes (Signed)
Coffee given 

## 2020-08-14 NOTE — Discharge Instructions (Signed)
Patient is instructed prior to discharge to: Take all medications as prescribed by his/her mental healthcare provider. Report any adverse effects and or reactions from the medicines to his/her outpatient provider promptly. Patient has been instructed & cautioned: To not engage in alcohol and or illegal drug use while on prescription medicines. In the event of worsening symptoms, patient is instructed to call the crisis hotline, 911 and or go to the nearest ED for appropriate evaluation and treatment of symptoms. To follow-up with his/her primary care provider for your other medical issues, concerns and or health care needs.   Patient is instructed prior to discharge to: Take all medications as prescribed by his/her mental healthcare provider. Report any adverse effects and or reactions from the medicines to his/her outpatient provider promptly. Patient has been instructed & cautioned: To not engage in alcohol and or illegal drug use while on prescription medicines. In the event of worsening symptoms, patient is instructed to call the crisis hotline, 911 and or go to the nearest ED for appropriate evaluation and treatment of symptoms. To follow-up with his/her primary care provider for your other medical issues, concerns and or health care needs.

## 2020-08-14 NOTE — ED Triage Notes (Signed)
Presents with suicidal ideations and requesting DEtox form Fentanyl and alcohol abuse.

## 2020-08-14 NOTE — ED Notes (Signed)
Pt states back hurts and is pacing d/t it. Gave prn meds per orders. States 7/10

## 2020-08-14 NOTE — ED Notes (Signed)
Pt resting with eyes closed. Rise and fall of chest noted. Will conitue to monitor for safety

## 2020-08-14 NOTE — ED Notes (Signed)
Raisin Bran and milk given for breakfast

## 2020-08-14 NOTE — ED Notes (Signed)
Patient is given salad and a salad dressing

## 2020-09-01 ENCOUNTER — Other Ambulatory Visit: Payer: Self-pay | Admitting: Psychiatric/Mental Health

## 2020-09-03 ENCOUNTER — Ambulatory Visit: Payer: Self-pay | Admitting: Physician Assistant

## 2020-09-03 MED FILL — ?ARIPIPRAZOLE 15MG TABLETS: 15 | 30 days supply | Qty: 30 | Fill #0

## 2020-09-03 MED FILL — QUETIAPINE FUMARATE 50 MG T: 50 | 30 days supply | Qty: 60 | Fill #0

## 2020-09-04 MED FILL — ?BUSPIRONE HCL 10 MG TABLET: 10 | 30 days supply | Qty: 135 | Fill #0

## 2020-09-04 MED FILL — ?TRAZODONE HCL 50 TABS: 50 | 30 days supply | Qty: 60 | Fill #0

## 2020-09-14 ENCOUNTER — Encounter: Payer: Self-pay | Admitting: Physician Assistant

## 2020-11-01 ENCOUNTER — Encounter (HOSPITAL_COMMUNITY): Payer: Self-pay | Admitting: Emergency Medicine

## 2020-11-01 ENCOUNTER — Other Ambulatory Visit: Payer: Self-pay

## 2020-11-01 ENCOUNTER — Ambulatory Visit (HOSPITAL_COMMUNITY)
Admission: EM | Admit: 2020-11-01 | Discharge: 2020-11-02 | Disposition: A | Discharge status: Home / Self Care | Payer: No Payment, Other | Attending: Registered Nurse | Admitting: Registered Nurse

## 2020-11-01 DIAGNOSIS — F333 Major depressive disorder, recurrent, severe with psychotic symptoms: Secondary | ICD-10-CM | POA: Diagnosis present

## 2020-11-01 DIAGNOSIS — F1911 Other psychoactive substance abuse, in remission: Secondary | ICD-10-CM | POA: Diagnosis not present

## 2020-11-01 DIAGNOSIS — R4587 Impulsiveness: Secondary | ICD-10-CM | POA: Insufficient documentation

## 2020-11-01 DIAGNOSIS — Z79899 Other long term (current) drug therapy: Secondary | ICD-10-CM | POA: Insufficient documentation

## 2020-11-01 DIAGNOSIS — Z20822 Contact with and (suspected) exposure to covid-19: Secondary | ICD-10-CM | POA: Insufficient documentation

## 2020-11-01 DIAGNOSIS — F419 Anxiety disorder, unspecified: Secondary | ICD-10-CM | POA: Insufficient documentation

## 2020-11-01 DIAGNOSIS — R45851 Suicidal ideations: Secondary | ICD-10-CM

## 2020-11-01 DIAGNOSIS — F1910 Other psychoactive substance abuse, uncomplicated: Secondary | ICD-10-CM

## 2020-11-01 DIAGNOSIS — F1020 Alcohol dependence, uncomplicated: Secondary | ICD-10-CM | POA: Diagnosis present

## 2020-11-01 DIAGNOSIS — R450 Nervousness: Secondary | ICD-10-CM | POA: Insufficient documentation

## 2020-11-01 LAB — URINALYSIS, ROUTINE W REFLEX MICROSCOPIC
Bacteria, UA: NONE SEEN
Bilirubin Urine: NEGATIVE
Glucose, UA: NEGATIVE mg/dL
Hgb urine dipstick: NEGATIVE
Ketones, ur: NEGATIVE mg/dL
Nitrite: NEGATIVE
Protein, ur: NEGATIVE mg/dL
Specific Gravity, Urine: 1.016 (ref 1.005–1.030)
pH: 5 (ref 5.0–8.0)

## 2020-11-01 LAB — CBC WITH DIFFERENTIAL/PLATELET
Abs Immature Granulocytes: 0.2 10*3/uL — ABNORMAL HIGH (ref 0.00–0.07)
Basophils Absolute: 0.1 10*3/uL (ref 0.0–0.1)
Basophils Relative: 1 %
Eosinophils Absolute: 0.1 10*3/uL (ref 0.0–0.5)
Eosinophils Relative: 1 %
HCT: 46 % (ref 39.0–52.0)
Hemoglobin: 15.4 g/dL (ref 13.0–17.0)
Immature Granulocytes: 2 %
Lymphocytes Relative: 21 %
Lymphs Abs: 2.9 10*3/uL (ref 0.7–4.0)
MCH: 31.2 pg (ref 26.0–34.0)
MCHC: 33.5 g/dL (ref 30.0–36.0)
MCV: 93.3 fL (ref 80.0–100.0)
Monocytes Absolute: 0.7 10*3/uL (ref 0.1–1.0)
Monocytes Relative: 5 %
Neutro Abs: 9.7 10*3/uL — ABNORMAL HIGH (ref 1.7–7.7)
Neutrophils Relative %: 70 %
Platelets: 422 10*3/uL — ABNORMAL HIGH (ref 150–400)
RBC: 4.93 MIL/uL (ref 4.22–5.81)
RDW: 13.4 % (ref 11.5–15.5)
WBC: 13.7 10*3/uL — ABNORMAL HIGH (ref 4.0–10.5)
nRBC: 0 % (ref 0.0–0.2)

## 2020-11-01 LAB — COMPREHENSIVE METABOLIC PANEL
ALT: 36 U/L (ref 0–44)
AST: 34 U/L (ref 15–41)
Albumin: 4.3 g/dL (ref 3.5–5.0)
Alkaline Phosphatase: 85 U/L (ref 38–126)
Anion gap: 9 (ref 5–15)
BUN: 17 mg/dL (ref 6–20)
CO2: 25 mmol/L (ref 22–32)
Calcium: 9.2 mg/dL (ref 8.9–10.3)
Chloride: 104 mmol/L (ref 98–111)
Creatinine, Ser: 0.88 mg/dL (ref 0.61–1.24)
GFR, Estimated: >60 mL/min (ref >60)
Glucose, Bld: 85 mg/dL (ref 70–99)
Potassium: 3.8 mmol/L (ref 3.5–5.1)
Sodium: 138 mmol/L (ref 135–145)
Total Bilirubin: 0.6 mg/dL (ref 0.3–1.2)
Total Protein: 6.8 g/dL (ref 6.5–8.1)

## 2020-11-01 LAB — POCT URINE DRUG SCREEN - MANUAL ENTRY (I-SCREEN)
POC Amphetamine UR: NOT DETECTED
POC Buprenorphine (BUP): NOT DETECTED
POC Cocaine UR: NOT DETECTED
POC Marijuana UR: POSITIVE — AB
POC Methadone UR: NOT DETECTED
POC Methamphetamine UR: NOT DETECTED
POC Morphine: NOT DETECTED
POC Oxazepam (BZO): NOT DETECTED
POC Oxycodone UR: NOT DETECTED
POC Secobarbital (BAR): NOT DETECTED

## 2020-11-01 LAB — LIPID PANEL
Cholesterol: 254 mg/dL — ABNORMAL HIGH (ref 0–200)
HDL: 70 mg/dL (ref >40)
LDL Cholesterol: 105 mg/dL — ABNORMAL HIGH (ref 0–99)
Total CHOL/HDL Ratio: 3.6 RATIO
Triglycerides: 396 mg/dL — ABNORMAL HIGH (ref <150)
VLDL: 79 mg/dL — ABNORMAL HIGH (ref 0–40)

## 2020-11-01 LAB — ETHANOL: Alcohol, Ethyl (B): 156 mg/dL — ABNORMAL HIGH (ref <10)

## 2020-11-01 LAB — RESP PANEL BY RT-PCR (FLU A&B, COVID) ARPGX2
Influenza A by PCR: NEGATIVE
Influenza B by PCR: NEGATIVE
SARS Coronavirus 2 by RT PCR: NEGATIVE

## 2020-11-01 LAB — HEMOGLOBIN A1C
Hgb A1c MFr Bld: 5.1 % (ref 4.8–5.6)
Mean Plasma Glucose: 99.67 mg/dL

## 2020-11-01 LAB — TSH: TSH: 1.099 u[IU]/mL (ref 0.350–4.500)

## 2020-11-01 LAB — MAGNESIUM: Magnesium: 2.5 mg/dL — ABNORMAL HIGH (ref 1.7–2.4)

## 2020-11-01 LAB — POC SARS CORONAVIRUS 2 AG -  ED: SARS Coronavirus 2 Ag: NEGATIVE

## 2020-11-01 MED ORDER — NICOTINE 21 MG/24HR TD PT24
21.0000 mg | MEDICATED_PATCH | Freq: Every day | TRANSDERMAL | Status: DC
  Administered 2020-11-01 – 2020-11-02 (×2): 21 mg via TRANSDERMAL
  Filled 2020-11-01 (×2): qty 1

## 2020-11-01 MED ORDER — QUETIAPINE FUMARATE 100 MG PO TABS
100.0000 mg | ORAL_TABLET | Freq: Every day | ORAL | Status: DC
  Administered 2020-11-01: 21:00:00 100 mg via ORAL
  Filled 2020-11-01: qty 7
  Filled 2020-11-01: qty 1

## 2020-11-01 MED ORDER — THIAMINE HCL 100 MG PO TABS
100.0000 mg | ORAL_TABLET | Freq: Every day | ORAL | Status: DC
  Administered 2020-11-02: 09:00:00 100 mg via ORAL
  Filled 2020-11-01: qty 1

## 2020-11-01 MED ORDER — CHLORDIAZEPOXIDE HCL 25 MG PO CAPS: 25.0000 mg | ORAL_CAPSULE | Freq: Four times a day (QID) | ORAL | Status: DC | PRN

## 2020-11-01 MED ORDER — ACETAMINOPHEN 325 MG PO TABS
650.0000 mg | ORAL_TABLET | Freq: Four times a day (QID) | ORAL | Status: DC | PRN
  Administered 2020-11-02: 650 mg via ORAL
  Filled 2020-11-01: qty 2

## 2020-11-01 MED ORDER — ONDANSETRON 4 MG PO TBDP: 4.0000 mg | ORAL_TABLET | Freq: Four times a day (QID) | ORAL | Status: DC | PRN

## 2020-11-01 MED ORDER — ALUM & MAG HYDROXIDE-SIMETH 200-200-20 MG/5ML PO SUSP: 30.0000 mL | ORAL | Status: DC | PRN

## 2020-11-01 MED ORDER — TRAZODONE HCL 50 MG PO TABS
50.0000 mg | ORAL_TABLET | Freq: Every evening | ORAL | Status: DC | PRN
  Administered 2020-11-01: 21:00:00 50 mg via ORAL
  Filled 2020-11-01: qty 7
  Filled 2020-11-01: qty 1

## 2020-11-01 MED ORDER — AMLODIPINE BESYLATE 10 MG PO TABS
10.0000 mg | ORAL_TABLET | Freq: Every day | ORAL | Status: DC
  Administered 2020-11-01 – 2020-11-02 (×2): 10 mg via ORAL
  Filled 2020-11-01: qty 7
  Filled 2020-11-01 (×2): qty 1

## 2020-11-01 MED ORDER — HYDROXYZINE HCL 25 MG PO TABS
25.0000 mg | ORAL_TABLET | Freq: Four times a day (QID) | ORAL | Status: DC | PRN
  Administered 2020-11-01 – 2020-11-02 (×3): 25 mg via ORAL
  Filled 2020-11-01 (×3): qty 1

## 2020-11-01 MED ORDER — LOPERAMIDE HCL 2 MG PO CAPS: 2.0000 mg | ORAL_CAPSULE | ORAL | Status: DC | PRN

## 2020-11-01 MED ORDER — MAGNESIUM HYDROXIDE 400 MG/5ML PO SUSP: 30.0000 mL | Freq: Every day | ORAL | Status: DC | PRN

## 2020-11-01 MED ORDER — THIAMINE HCL 100 MG/ML IJ SOLN
100.0000 mg | Freq: Once | INTRAMUSCULAR | Status: AC
  Administered 2020-11-01: 18:00:00 100 mg via INTRAMUSCULAR
  Filled 2020-11-01: qty 2

## 2020-11-01 MED ORDER — ADULT MULTIVITAMIN W/MINERALS CH
1.0000 | ORAL_TABLET | Freq: Every day | ORAL | Status: DC
  Administered 2020-11-01 – 2020-11-02 (×2): 1 via ORAL
  Filled 2020-11-01 (×2): qty 1

## 2020-11-01 NOTE — ED Notes (Signed)
Pt sleeping@this time. Breathing even and unlabored. Will continue to monitor for safety 
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
## 2020-11-01 NOTE — ED Provider Notes (Signed)
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 06/28/2020 3.8  3.5 - 5.1 mmol/L Final  . Chloride 06/28/2020 99  98 - 111 mmol/L Final  . CO2 06/28/2020 28  22 - 32 mmol/L Final  . Glucose, Bld 06/28/2020 95  70 - 99 mg/dL Final   Glucose reference range applies only to samples taken after fasting for at  least 8 hours.  . BUN 06/28/2020 11  6 - 20 mg/dL Final  . Creatinine, Ser 06/28/2020 0.71  0.61 -  1.24 mg/dL Final  . Calcium 06/28/2020 9.6  8.9 - 10.3 mg/dL Final  . Total Protein 06/28/2020 7.8  6.5 - 8.1 g/dL Final  . Albumin 06/28/2020 4.6  3.5 - 5.0 g/dL Final  . AST 06/28/2020 25  15 - 41 U/L Final  . ALT 06/28/2020 26  0 - 44 U/L Final  . Alkaline Phosphatase 06/28/2020 79  38 - 126 U/L Final  . Total Bilirubin 06/28/2020 0.4  0.3 - 1.2 mg/dL Final  . GFR calc non Af Amer 06/28/2020 >60  >60 mL/min Final  . GFR calc Af Amer 06/28/2020 >60  >60 mL/min Final  . Anion gap 06/28/2020 12  5 - 15 Final   Performed at [facility], [address] [place], [place] [address]  . Alcohol, Ethyl (B) 06/28/2020 <10  <10 mg/dL Final   Comment: (NOTE) Lowest detectable limit for serum alcohol is 10 mg/dL.  For medical purposes only. Performed at [facility], [address] [place], [place] [address]   . Opiates 06/28/2020 NONE DETECTED  NONE DETECTED Final  . Cocaine 06/28/2020 NONE DETECTED  NONE DETECTED Final  . Benzodiazepines 06/28/2020 NONE DETECTED  NONE DETECTED Final  . Amphetamines 06/28/2020 NONE DETECTED  NONE DETECTED Final  . Tetrahydrocannabinol 06/28/2020 POSITIVE* NONE DETECTED Final  . Barbiturates 06/28/2020 NONE DETECTED  NONE DETECTED Final   Comment: (NOTE) DRUG SCREEN FOR MEDICAL PURPOSES ONLY.  IF CONFIRMATION IS NEEDED FOR ANY PURPOSE, NOTIFY LAB WITHIN 5 DAYS.  LOWEST DETECTABLE LIMITS FOR URINE DRUG SCREEN Drug Class                     Cutoff (ng/mL) Amphetamine and metabolites    1000 Barbiturate and metabolites    200 Benzodiazepine                 132 Tricyclics and metabolites     300 Opiates and metabolites        300 Cocaine and metabolites        300 THC                            50 Performed at [facility], [address] [place], [place] [address]   . SARS Coronavirus 2 06/28/2020 NEGATIVE  NEGATIVE Final   Comment: (NOTE) SARS-CoV-2  target nucleic acids are NOT DETECTED.  The SARS-CoV-2 RNA is generally detectable in upper and lower respiratory specimens during the acute phase of infection. The lowest concentration of SARS-CoV-2 viral copies this assay can detect is 250 copies / mL. A negative result does not preclude SARS-CoV-2 infection and should not be used as the sole basis for treatment or other patient management decisions.  A negative result may occur with improper specimen collection / handling, submission of specimen other than nasopharyngeal swab, presence of viral mutation(s) within the areas targeted by this assay, and inadequate number of viral copies (<250 copies / mL). A negative result must be combined with clinical observations, patient history, and epidemiological information.  Fact Sheet for Patients:   StrictlyIdeas.no  Fact Sheet for Healthcare Providers: BankingDealers.co.za  This test is not yet approved or                           cleared by the [place] FDA and has been authorized for detection and/or diagnosis of SARS-CoV-2 by FDA under an Emergency Use Authorization (EUA).  This EUA  will remain in effect (meaning this test can be used) for the duration of the COVID-19 declaration under Section 564(b)(1) of the Act, 21 U.S.C. section 360bbb-3(b)(1), unless the authorization is terminated or revoked sooner.  Performed at [facility], [address], [place], [place] [address]   Admission on 06/23/2020, Discharged on 06/24/2020  Component Date Value Ref Range Status  . WBC 06/24/2020 14.5* 4.0 - 10.5 K/uL Final  . RBC 06/24/2020 5.27  4.22 - 5.81 MIL/uL Final  . Hemoglobin 06/24/2020 16.4  13.0 - 17.0 g/dL Final  . HCT 06/24/2020 48.7  39.0 - 52.0 % Final  . MCV 06/24/2020 92.4  80.0 - 100.0 fL Final  . MCH 06/24/2020 31.1  26.0 - 34.0 pg Final  . MCHC 06/24/2020 33.7  30.0 - 36.0 g/dL Final  . RDW 06/24/2020 13.4  11.5 - 15.5 %  Final  . Platelets 06/24/2020 411* 150 - 400 K/uL Final  . nRBC 06/24/2020 0.0  0.0 - 0.2 % Final  . Neutrophils Relative % 06/24/2020 67  % Final  . Neutro Abs 06/24/2020 9.7* 1.7 - 7.7 K/uL Final  . Lymphocytes Relative 06/24/2020 23  % Final  . Lymphs Abs 06/24/2020 3.3  0.7 - 4.0 K/uL Final  . Monocytes Relative 06/24/2020 9  % Final  . Monocytes Absolute 06/24/2020 1.3* 0.1 - 1.0 K/uL Final  . Eosinophils Relative 06/24/2020 0  % Final  . Eosinophils Absolute 06/24/2020 0.1  0.0 - 0.5 K/uL Final  . Basophils Relative 06/24/2020 1  % Final  . Basophils Absolute 06/24/2020 0.1  0.0 - 0.1 K/uL Final  . Immature Granulocytes 06/24/2020 0  % Final  . Abs Immature Granulocytes 06/24/2020 0.06  0.00 - 0.07 K/uL Final   Performed at [facility], [address] [place], [place] [address]  . Sodium 06/24/2020 138  135 - 145 mmol/L Final  . Potassium 06/24/2020 3.4* 3.5 - 5.1 mmol/L Final  . Chloride 06/24/2020 99  98 - 111 mmol/L Final  . CO2 06/24/2020 24  22 - 32 mmol/L Final  . Glucose, Bld 06/24/2020 96  70 - 99 mg/dL Final   Glucose reference range applies only to samples taken after fasting for at least 8 hours.  . BUN 06/24/2020 12  6 - 20 mg/dL Final  . Creatinine, Ser 06/24/2020 1.05  0.61 - 1.24 mg/dL Final  . Calcium 06/24/2020 9.9  8.9 - 10.3 mg/dL Final  . GFR calc non Af Amer 06/24/2020 >60  >60 mL/min Final  . GFR calc Af Amer 06/24/2020 >60  >60 mL/min Final  . Anion gap 06/24/2020 15  5 - 15 Final   Performed at [facility], [address] [place], [place] [address]  . Opiates 06/24/2020 NONE DETECTED  NONE DETECTED Final  . Cocaine 06/24/2020 POSITIVE* NONE DETECTED Final  . Benzodiazepines 06/24/2020 NONE DETECTED  NONE DETECTED Final  . Amphetamines 06/24/2020 POSITIVE* NONE DETECTED Final  . Tetrahydrocannabinol 06/24/2020 POSITIVE* NONE DETECTED Final  . Barbiturates 06/24/2020 NONE DETECTED  NONE DETECTED Final   Comment: (NOTE) DRUG SCREEN FOR MEDICAL  PURPOSES ONLY.  IF CONFIRMATION IS NEEDED FOR ANY PURPOSE, NOTIFY LAB WITHIN 5 DAYS.  LOWEST DETECTABLE LIMITS FOR URINE DRUG SCREEN Drug Class                     Cutoff (ng/mL) Amphetamine and metabolites    1000 Barbiturate and metabolites    200 Benzodiazepine  397 Tricyclics and metabolites     300 Opiates and metabolites        300 Cocaine and metabolites        300 THC                            50 Performed at [facility], [address] [place], [place] [address]   . Troponin I (High Sensitivity) 06/24/2020 8  <18 ng/L Final   Comment: (NOTE) Elevated high sensitivity troponin I (hsTnI) values and significant  changes across serial measurements may suggest ACS but many other  chronic and acute conditions are known to elevate hsTnI results.  Refer to the "Links" section for chest pain algorithms and additional  guidance. Performed at [facility], [address] [place], [place] [address]   . Alcohol, Ethyl (B) 06/24/2020 10* <10 mg/dL Final   Comment: (NOTE) Lowest detectable limit for serum alcohol is 10 mg/dL.  For medical purposes only. Performed at [facility], [address] [place], [place] [address]   . Troponin I (High Sensitivity) 06/24/2020 8  <18 ng/L Final   Comment: (NOTE) Elevated high sensitivity troponin I (hsTnI) values and significant  changes across serial measurements may suggest ACS but many other  chronic and acute conditions are known to elevate hsTnI results.  Refer to the "Links" section for chest pain algorithms and additional  guidance. Performed at [facility], [address], [place], [place] [address]     Allergies: Cogentin [benztropine], Zyprexa [olanzapine], Celexa [citalopram hydrobromide], Depakote [valproic acid], Effexor [venlafaxine], Geodon [ziprasidone hcl], Haldol [haloperidol], Hydrochlorothiazide, Lasix [furosemide], Lexapro [escitalopram], Lisinopril, Lithium, Neurontin [gabapentin], Thorazine  [chlorpromazine], Topamax [topiramate], Toradol [ketorolac tromethamine], and Tramadol  PTA Medications: (Not in a hospital admission)   Medical Decision Making  Admitted to continuous Assessment Unit for stabilization and to determine if higher level of care needed.   Ordered routine labs and EKG  Lab Orders     Resp Panel by RT-PCR (Flu A&B, Covid) Nasopharyngeal Swab     CBC with Differential/Platelet     Comprehensive metabolic panel     Hemoglobin A1c     Magnesium     Ethanol     Lipid panel     TSH     Urinalysis, Routine w reflex microscopic Urine, Clean Catch     POC SARS Coronavirus 2 Ag-ED - Nasal Swab (BD Veritor Kit)     POCT Urine Drug Screen - (IC[facility])    Medication Management:  Meds ordered this encounter  Medications  . acetaminophen (TYLENOL) tablet 650 mg  . alum & mag hydroxide-simeth (MAALOX/MYLANTA) 200-200-20 MG/5ML suspension 30 mL  . magnesium hydroxide (MILK OF MAGNESIA) suspension 30 mL  . traZODone (DESYREL) tablet 50 mg  . thiamine (B-1) injection 100 mg  . thiamine tablet 100 mg  . multivitamin with minerals tablet 1 tablet  . chlordiazePOXIDE (LIBRIUM) capsule 25 mg  . hydrOXYzine (ATARAX/VISTARIL) tablet 25 mg  . loperamide (IMODIUM) capsule 2-4 mg  . ondansetron (ZOFRAN-ODT) disintegrating tablet 4 mg  . amLODipine (NORVASC) tablet 10 mg  . QUEtiapine (SEROQUEL) tablet 100 mg    Recommendations  Based on my evaluation the patient does not appear to have an emergency medical condition.  Philisha Weinel, NP 11/01/20  5:22 PM

## 2020-11-01 NOTE — Discharge Instructions (Addendum)

## 2020-11-01 NOTE — ED Triage Notes (Signed)
Patient is mourning wife death about five years ago. Worst Christmas ever. Patient states he has been off his medications for about 11 days. Patient states he feels suicidal. Plan is to drink gasoline. Patient is not HI and states he hears good voices which are way better than the bad ones.

## 2020-11-02 MED ORDER — ARIPIPRAZOLE ER 300 MG IM SRER: 300.0000 mg | INTRAMUSCULAR | 0 refills | Status: DC

## 2020-11-02 MED ORDER — ARIPIPRAZOLE ER 300 MG IM SRER
300.0000 mg | INTRAMUSCULAR | Status: DC
  Administered 2020-11-02: 09:00:00 300 mg via INTRAMUSCULAR

## 2020-11-02 MED ORDER — AMLODIPINE BESYLATE 10 MG PO TABS: 10.0000 mg | ORAL_TABLET | Freq: Every day | ORAL | 0 refills | Status: DC

## 2020-11-02 MED ORDER — QUETIAPINE FUMARATE 100 MG PO TABS: 100.0000 mg | ORAL_TABLET | Freq: Every day | ORAL | 0 refills | Status: DC

## 2020-11-02 MED ORDER — TRAZODONE HCL 50 MG PO TABS: 50.0000 mg | ORAL_TABLET | Freq: Every evening | ORAL | 0 refills | Status: DC | PRN

## 2020-11-02 MED ORDER — BUSPIRONE HCL 15 MG PO TABS
15.0000 mg | ORAL_TABLET | Freq: Three times a day (TID) | ORAL | Status: DC
  Administered 2020-11-02: 09:00:00 15 mg via ORAL
  Filled 2020-11-02: qty 1

## 2020-11-02 MED ORDER — BUSPIRONE HCL 10 MG PO TABS
10.0000 mg | ORAL_TABLET | Freq: Three times a day (TID) | ORAL | Status: DC
Start: 2020-11-02 — End: 2020-11-02
  Filled 2020-11-02: qty 21

## 2020-11-02 MED ORDER — ARIPIPRAZOLE 10 MG PO TABS
10.0000 mg | ORAL_TABLET | Freq: Every day | ORAL | Status: DC
  Administered 2020-11-02: 09:00:00 10 mg via ORAL
  Filled 2020-11-02: qty 14

## 2020-11-02 MED ORDER — ARIPIPRAZOLE 2 MG PO TABS
10.0000 mg | ORAL_TABLET | Freq: Every day | ORAL | Status: DC
  Filled 2020-11-02: qty 5

## 2020-11-02 MED ORDER — BUSPIRONE HCL 10 MG PO TABS: 10.0000 mg | ORAL_TABLET | Freq: Three times a day (TID) | ORAL | 0 refills | Status: DC

## 2020-11-02 MED ORDER — ARIPIPRAZOLE 10 MG PO TABS: 10.0000 mg | ORAL_TABLET | Freq: Every day | ORAL | 0 refills | Status: DC
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
## 2020-11-02 NOTE — ED Notes (Signed)
Pt sleeping at present, no distress noted, monitoring for safety. 

## 2020-11-02 NOTE — ED Notes (Signed)
Patient reported having onset of withdrawals from alcohol.  MHT reported findings to RN.  Patient demonstrated no visible minimal signs.  Patient vitals taken with dinamap no concerns noted however information was passed on to RN.

## 2020-11-02 NOTE — ED Notes (Signed)
Pt sleeping. No distress observed. Safety maintained and will continue to monitor.  °

## 2020-11-02 NOTE — ED Notes (Signed)
Discharge instructions, prescriptions and samples provided to PT prior to d/c from facility. Pt stated understanding. Personal belongings returned prior to d/c. 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 Pt alert, orient and ambulatory. Safety maintained.

## 2020-11-16 ENCOUNTER — Ambulatory Visit
Admission: EM | Admit: 2020-11-16 | Discharge: 2020-11-16 | Disposition: A | Discharge status: Home / Self Care | Payer: Self-pay

## 2020-11-16 NOTE — ED Triage Notes (Signed)
Spoke with pt. He confirmed that he is undergoing alcohol withdrawal. Per Provider instructions I instructed to go the ED for treatment. Pt verbalized understanding.

## 2020-11-21 ENCOUNTER — Emergency Department (HOSPITAL_COMMUNITY)
Admission: EM | Admit: 2020-11-21 | Discharge: 2020-11-21 | Disposition: A | Discharge status: Home / Self Care | Payer: Self-pay | Attending: Emergency Medicine | Admitting: Emergency Medicine

## 2020-11-21 ENCOUNTER — Other Ambulatory Visit: Payer: Self-pay

## 2020-11-21 ENCOUNTER — Encounter (HOSPITAL_COMMUNITY): Payer: Self-pay | Admitting: Emergency Medicine

## 2020-11-21 DIAGNOSIS — F1010 Alcohol abuse, uncomplicated: Secondary | ICD-10-CM | POA: Insufficient documentation

## 2020-11-21 DIAGNOSIS — F17210 Nicotine dependence, cigarettes, uncomplicated: Secondary | ICD-10-CM | POA: Insufficient documentation

## 2020-11-21 DIAGNOSIS — Y900 Blood alcohol level of less than 20 mg/100 ml: Secondary | ICD-10-CM | POA: Insufficient documentation

## 2020-11-21 DIAGNOSIS — Z20822 Contact with and (suspected) exposure to covid-19: Secondary | ICD-10-CM | POA: Insufficient documentation

## 2020-11-21 DIAGNOSIS — M546 Pain in thoracic spine: Secondary | ICD-10-CM | POA: Insufficient documentation

## 2020-11-21 DIAGNOSIS — Z79899 Other long term (current) drug therapy: Secondary | ICD-10-CM | POA: Insufficient documentation

## 2020-11-21 DIAGNOSIS — J45909 Unspecified asthma, uncomplicated: Secondary | ICD-10-CM | POA: Insufficient documentation

## 2020-11-21 DIAGNOSIS — I10 Essential (primary) hypertension: Secondary | ICD-10-CM | POA: Insufficient documentation

## 2020-11-21 DIAGNOSIS — R251 Tremor, unspecified: Secondary | ICD-10-CM | POA: Insufficient documentation

## 2020-11-21 HISTORY — DX: Bipolar disorder, unspecified: F31.9

## 2020-11-21 HISTORY — DX: Other psychoactive substance abuse, uncomplicated: F19.10

## 2020-11-21 LAB — RAPID URINE DRUG SCREEN, HOSP PERFORMED
Amphetamines: NOT DETECTED
Barbiturates: NOT DETECTED
Benzodiazepines: NOT DETECTED
Cocaine: NOT DETECTED
Opiates: NOT DETECTED
Tetrahydrocannabinol: POSITIVE — AB

## 2020-11-21 LAB — COMPREHENSIVE METABOLIC PANEL
ALT: 23 U/L (ref 0–44)
AST: 22 U/L (ref 15–41)
Albumin: 4.2 g/dL (ref 3.5–5.0)
Alkaline Phosphatase: 77 U/L (ref 38–126)
Anion gap: 8 (ref 5–15)
BUN: 17 mg/dL (ref 6–20)
CO2: 24 mmol/L (ref 22–32)
Calcium: 9 mg/dL (ref 8.9–10.3)
Chloride: 104 mmol/L (ref 98–111)
Creatinine, Ser: 0.91 mg/dL (ref 0.61–1.24)
GFR, Estimated: >60 mL/min (ref >60)
Glucose, Bld: 106 mg/dL — ABNORMAL HIGH (ref 70–99)
Potassium: 4 mmol/L (ref 3.5–5.1)
Sodium: 136 mmol/L (ref 135–145)
Total Bilirubin: 0.7 mg/dL (ref 0.3–1.2)
Total Protein: 7 g/dL (ref 6.5–8.1)

## 2020-11-21 LAB — CBC
HCT: 44.8 % (ref 39.0–52.0)
Hemoglobin: 15.2 g/dL (ref 13.0–17.0)
MCH: 31.7 pg (ref 26.0–34.0)
MCHC: 33.9 g/dL (ref 30.0–36.0)
MCV: 93.5 fL (ref 80.0–100.0)
Platelets: 275 10*3/uL (ref 150–400)
RBC: 4.79 MIL/uL (ref 4.22–5.81)
RDW: 13.2 % (ref 11.5–15.5)
WBC: 7.7 10*3/uL (ref 4.0–10.5)
nRBC: 0 % (ref 0.0–0.2)

## 2020-11-21 LAB — ETHANOL: Alcohol, Ethyl (B): <10 mg/dL (ref <10)

## 2020-11-21 LAB — ACETAMINOPHEN LEVEL: Acetaminophen (Tylenol), Serum: <10 ug/mL — ABNORMAL LOW (ref 10–30)

## 2020-11-21 LAB — SALICYLATE LEVEL: Salicylate Lvl: <7 mg/dL — ABNORMAL LOW (ref 7.0–30.0)

## 2020-11-21 MED ORDER — LORAZEPAM 1 MG PO TABS
1.0000 mg | ORAL_TABLET | ORAL | Status: DC | PRN
  Administered 2020-11-21: 1 mg via ORAL
  Filled 2020-11-21: qty 1

## 2020-11-21 MED ORDER — LORAZEPAM 2 MG/ML IJ SOLN: 1.0000 mg | INTRAMUSCULAR | Status: DC | PRN

## 2020-11-21 MED ORDER — FOLIC ACID 1 MG PO TABS
1.0000 mg | ORAL_TABLET | Freq: Every day | ORAL | Status: DC
  Administered 2020-11-21: 1 mg via ORAL
  Filled 2020-11-21: qty 1

## 2020-11-21 MED ORDER — AMLODIPINE BESYLATE 10 MG PO TABS
10.0000 mg | ORAL_TABLET | Freq: Every day | ORAL | 0 refills | Status: DC
Start: 2020-11-21 — End: 2021-02-24

## 2020-11-21 MED ORDER — THIAMINE HCL 100 MG PO TABS
100.0000 mg | ORAL_TABLET | Freq: Every day | ORAL | Status: DC
  Administered 2020-11-21: 100 mg via ORAL
  Filled 2020-11-21: qty 1

## 2020-11-21 MED ORDER — THIAMINE HCL 100 MG/ML IJ SOLN: 100.0000 mg | Freq: Every day | INTRAMUSCULAR | Status: DC

## 2020-11-21 MED ORDER — CHLORDIAZEPOXIDE HCL 25 MG PO CAPS: ORAL_CAPSULE | ORAL | 0 refills | Status: DC

## 2020-11-21 MED ORDER — ADULT MULTIVITAMIN W/MINERALS CH
1.0000 | ORAL_TABLET | Freq: Every day | ORAL | Status: DC
  Administered 2020-11-21: 1 via ORAL
  Filled 2020-11-21: qty 1

## 2020-11-21 MED ORDER — AMLODIPINE BESYLATE 5 MG PO TABS
10.0000 mg | ORAL_TABLET | Freq: Once | ORAL | Status: AC
  Administered 2020-11-21: 10 mg via ORAL
  Filled 2020-11-21: qty 2

## 2020-11-21 MED ORDER — TIZANIDINE HCL 4 MG PO TABS: 4.0000 mg | ORAL_TABLET | Freq: Three times a day (TID) | ORAL | 0 refills | Status: DC | PRN

## 2020-11-21 NOTE — ED Triage Notes (Signed)
Patient requesting assistance with alcohol withdrawals. Per patient usual drinks a "pint a whiskey and x3 40oz or a 18 pack of beer depending on the day". Per patient last drunk a pint of Rum yesterday around 5pm. Per patient tremors, diarrhea, body aches, sweats. Denies headache. Per patient also around x2 Covid positive people at home. Patient does report cough. Patient also c/o back pain. Per patient aggravated old back injury assisting 300lb brother. Patients blood pressure 163/100 in triage. Per patient hx of hypertension and has ran out of blood pressure medication. Denies any SI or HI.

## 2020-11-21 NOTE — Discharge Instructions (Addendum)
Like we discussed, I am prescribing 3 medications.  First medication is called Librium.  This is a benzodiazepine.  This is also a tapered medication.  Only take it as prescribed.  Do not mix with alcohol.  This medication will help you safely discontinue your alcohol use.  I have also refilled your Norvasc.  This is your blood pressure medication.  Continue to take it once per day.  Have also sent a few tizanidine for your back pain.  Only take these as prescribed.  Do not mix them with alcohol.  I have also again given you outpatient resources for alcohol abuse.  I would recommend using these resources to find a local center that can help with your alcohol abuse.  Return to the emergency department with any new or worsening symptoms.  It was a pleasure to meet you.

## 2020-11-21 NOTE — ED Notes (Signed)
Awaiting covid results  Lab at bedside
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
## 2020-11-22 LAB — SARS CORONAVIRUS 2 (TAT 6-24 HRS): SARS Coronavirus 2: NEGATIVE

## 2021-02-17 ENCOUNTER — Other Ambulatory Visit: Payer: Self-pay

## 2021-02-17 ENCOUNTER — Ambulatory Visit (HOSPITAL_COMMUNITY)
Admission: EM | Admit: 2021-02-17 | Discharge: 2021-02-18 | Disposition: A | Discharge status: Other Acute Inpatient Hospital | Payer: No Payment, Other | Attending: Nurse Practitioner | Admitting: Nurse Practitioner

## 2021-02-17 ENCOUNTER — Ambulatory Visit (HOSPITAL_COMMUNITY)
Admission: AD | Admit: 2021-02-17 | Discharge: 2021-02-17 | Disposition: A | Discharge status: Home / Self Care | Payer: Self-pay | Attending: Psychiatry | Admitting: Psychiatry

## 2021-02-17 DIAGNOSIS — F1190 Opioid use, unspecified, uncomplicated: Secondary | ICD-10-CM | POA: Insufficient documentation

## 2021-02-17 DIAGNOSIS — R45851 Suicidal ideations: Secondary | ICD-10-CM | POA: Insufficient documentation

## 2021-02-17 DIAGNOSIS — Z20822 Contact with and (suspected) exposure to covid-19: Secondary | ICD-10-CM | POA: Insufficient documentation

## 2021-02-17 DIAGNOSIS — F203 Undifferentiated schizophrenia: Secondary | ICD-10-CM | POA: Insufficient documentation

## 2021-02-17 DIAGNOSIS — F319 Bipolar disorder, unspecified: Secondary | ICD-10-CM | POA: Insufficient documentation

## 2021-02-17 DIAGNOSIS — F4310 Post-traumatic stress disorder, unspecified: Secondary | ICD-10-CM | POA: Insufficient documentation

## 2021-02-17 DIAGNOSIS — Z79899 Other long term (current) drug therapy: Secondary | ICD-10-CM | POA: Insufficient documentation

## 2021-02-17 DIAGNOSIS — F1020 Alcohol dependence, uncomplicated: Secondary | ICD-10-CM | POA: Insufficient documentation

## 2021-02-17 DIAGNOSIS — F29 Unspecified psychosis not due to a substance or known physiological condition: Secondary | ICD-10-CM | POA: Insufficient documentation

## 2021-02-17 DIAGNOSIS — Z9114 Patient's other noncompliance with medication regimen: Secondary | ICD-10-CM | POA: Insufficient documentation

## 2021-02-17 DIAGNOSIS — F17210 Nicotine dependence, cigarettes, uncomplicated: Secondary | ICD-10-CM | POA: Insufficient documentation

## 2021-02-17 DIAGNOSIS — F1290 Cannabis use, unspecified, uncomplicated: Secondary | ICD-10-CM | POA: Insufficient documentation

## 2021-02-17 LAB — POC SARS CORONAVIRUS 2 AG: SARS Coronavirus 2 Ag: NEGATIVE

## 2021-02-17 MED ORDER — LORAZEPAM 1 MG PO TABS
1.0000 mg | ORAL_TABLET | Freq: Four times a day (QID) | ORAL | Status: DC
  Administered 2021-02-17: 1 mg via ORAL
  Filled 2021-02-17: qty 1

## 2021-02-17 MED ORDER — LORAZEPAM 1 MG PO TABS: 1.0000 mg | ORAL_TABLET | Freq: Three times a day (TID) | ORAL | Status: DC

## 2021-02-17 MED ORDER — NICOTINE 21 MG/24HR TD PT24
21.0000 mg | MEDICATED_PATCH | Freq: Every day | TRANSDERMAL | Status: DC
  Administered 2021-02-17 – 2021-02-18 (×2): 21 mg via TRANSDERMAL
  Filled 2021-02-17 (×2): qty 1

## 2021-02-17 MED ORDER — QUETIAPINE FUMARATE 100 MG PO TABS
100.0000 mg | ORAL_TABLET | Freq: Every day | ORAL | Status: DC
  Administered 2021-02-17: 100 mg via ORAL
  Filled 2021-02-17: qty 1

## 2021-02-17 MED ORDER — LORAZEPAM 1 MG PO TABS: 1.0000 mg | ORAL_TABLET | Freq: Every day | ORAL | Status: DC

## 2021-02-17 MED ORDER — BUSPIRONE HCL 5 MG PO TABS
10.0000 mg | ORAL_TABLET | Freq: Three times a day (TID) | ORAL | Status: DC
  Administered 2021-02-17 – 2021-02-18 (×2): 10 mg via ORAL
  Filled 2021-02-17 (×2): qty 2

## 2021-02-17 MED ORDER — AMLODIPINE BESYLATE 10 MG PO TABS
10.0000 mg | ORAL_TABLET | Freq: Every day | ORAL | Status: DC
  Administered 2021-02-18: 10 mg via ORAL
  Filled 2021-02-17: qty 1

## 2021-02-17 MED ORDER — THIAMINE HCL 100 MG PO TABS
100.0000 mg | ORAL_TABLET | Freq: Every day | ORAL | Status: DC
  Administered 2021-02-18: 100 mg via ORAL
  Filled 2021-02-17: qty 1

## 2021-02-17 MED ORDER — LOPERAMIDE HCL 2 MG PO CAPS: 2.0000 mg | ORAL_CAPSULE | ORAL | Status: DC | PRN

## 2021-02-17 MED ORDER — ARIPIPRAZOLE 10 MG PO TABS
10.0000 mg | ORAL_TABLET | Freq: Every day | ORAL | Status: DC
  Administered 2021-02-17: 10 mg via ORAL
  Filled 2021-02-17: qty 1

## 2021-02-17 MED ORDER — ACETAMINOPHEN 325 MG PO TABS
650.0000 mg | ORAL_TABLET | Freq: Four times a day (QID) | ORAL | Status: DC | PRN
  Administered 2021-02-18: 650 mg via ORAL
  Filled 2021-02-17: qty 2

## 2021-02-17 MED ORDER — ADULT MULTIVITAMIN W/MINERALS CH
1.0000 | ORAL_TABLET | Freq: Every day | ORAL | Status: DC
  Administered 2021-02-18: 1 via ORAL
  Filled 2021-02-17: qty 1

## 2021-02-17 MED ORDER — LORAZEPAM 1 MG PO TABS
1.0000 mg | ORAL_TABLET | Freq: Four times a day (QID) | ORAL | Status: DC | PRN
  Administered 2021-02-18 (×3): 1 mg via ORAL
  Filled 2021-02-17: qty 2
  Filled 2021-02-17 (×3): qty 1

## 2021-02-17 MED ORDER — HYDROXYZINE HCL 25 MG PO TABS
25.0000 mg | ORAL_TABLET | Freq: Four times a day (QID) | ORAL | Status: DC | PRN
  Administered 2021-02-18 (×2): 25 mg via ORAL
  Filled 2021-02-17 (×3): qty 1

## 2021-02-17 MED ORDER — LORAZEPAM 1 MG PO TABS: 1.0000 mg | ORAL_TABLET | Freq: Two times a day (BID) | ORAL | Status: DC

## 2021-02-17 MED ORDER — MAGNESIUM HYDROXIDE 400 MG/5ML PO SUSP: 30.0000 mL | Freq: Every day | ORAL | Status: DC | PRN

## 2021-02-17 MED ORDER — ONDANSETRON 4 MG PO TBDP: 4.0000 mg | ORAL_TABLET | Freq: Four times a day (QID) | ORAL | Status: DC | PRN

## 2021-02-17 MED ORDER — ALUM & MAG HYDROXIDE-SIMETH 200-200-20 MG/5ML PO SUSP: 30.0000 mL | ORAL | Status: DC | PRN

## 2021-02-17 MED ORDER — TRAZODONE HCL 50 MG PO TABS: 50.0000 mg | ORAL_TABLET | Freq: Every evening | ORAL | Status: DC | PRN

## 2021-02-17 NOTE — ED Provider Notes (Signed)
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 unless the authorization is terminated or revoked sooner.  Performed at [facility], [address] [place],  [place] [address]   Admission on 11/01/2020, Discharged on 11/02/2020  Component Date Value Ref Range Status  . SARS Coronavirus 2 by RT PCR 11/01/2020 NEGATIVE  NEGATIVE Final   Comment: (NOTE) SARS-CoV-2 target nucleic acids are NOT DETECTED.  The SARS-CoV-2 RNA is generally detectable in upper respiratory specimens during the acute phase of infection. The lowest concentration of SARS-CoV-2 viral copies this assay can detect is 138 copies/mL. A negative result does not preclude SARS-Cov-2 infection and should not be used as the sole basis for  treatment or other patient management decisions. A negative result may occur with  improper specimen collection/handling, submission of specimen other than nasopharyngeal swab, presence of viral mutation(s) within the areas targeted by this assay, and inadequate number of viral copies(<138 copies/mL). A negative result must be combined with clinical observations, patient history, and epidemiological information. The expected result is Negative.  Fact Sheet for Patients:  BloggerCourse.com  Fact Sheet for Healthcare Providers:  SeriousBroker.it  This test is no                          t yet approved or cleared by the [place] FDA and  has been authorized for detection and/or diagnosis of SARS-CoV-2 by FDA under an Emergency Use Authorization (EUA). This EUA will remain  in effect (meaning this test can be used) for the duration of the COVID-19 declaration under Section 564(b)(1) of the Act, 21 U.S.C.section 360bbb-3(b)(1), unless the authorization is terminated  or revoked sooner.      . Influenza A by PCR 11/01/2020 NEGATIVE  NEGATIVE Final  . Influenza B by PCR 11/01/2020 NEGATIVE  NEGATIVE Final   Comment: (NOTE) The Xpert Xpress SARS-CoV-2/FLU/RSV plus assay is intended as an aid in the diagnosis of influenza from Nasopharyngeal swab specimens and should not be used as a sole basis  for treatment. Nasal washings and aspirates are unacceptable for Xpert Xpress SARS-CoV-2/FLU/RSV testing.  Fact Sheet for Patients: BloggerCourse.com  Fact Sheet for Healthcare Providers: SeriousBroker.it  This test is not yet approved or cleared by the [place] FDA and has been authorized for detection and/or diagnosis of SARS-CoV-2 by FDA under an Emergency Use Authorization (EUA). This EUA will remain in effect (meaning this test can be used) for the duration of the COVID-19 declaration under Section 564(b)(1) of the Act, 21 U.S.C. section 360bbb-3(b)(1), unless the authorization is terminated or revoked.  Performed at [facility], [address] [place], [place] [address]   . SARS Coronavirus 2 Ag 11/01/2020 Negative  Negative Final  . WBC 11/01/2020 13.7* 4.0 - 10.5 K/uL Final  . RBC 11/01/2020 4.93  4.22 - 5.81 MIL/uL Final  . Hemoglobin 11/01/2020 15.4  13.0 - 17.0 g/dL Final  . HCT 60/45/4098 46.0  39.0 - 52.0 % Final  . MCV 11/01/2020 93.3  80.0 - 100.0 fL Final  . MCH 11/01/2020 31.2  26.0 - 34.0 pg Final  . MCHC 11/01/2020 33.5  30.0 - 36.0 g/dL Final  . RDW 11/91/4782 13.4  11.5 - 15.5 % Final  . Platelets 11/01/2020 422* 150 - 400 K/uL Final  . nRBC 11/01/2020 0.0  0.0 - 0.2 % Final  . Neutrophils Relative % 11/01/2020 70  % Final  . Neutro Abs 11/01/2020 9.7* 1.7 - 7.7 K/uL Final  . Lymphocytes Relative 11/01/2020 21  % Final  . Lymphs Abs 11/01/2020 2.9  0.7 - 4.0 K/uL Final  . Monocytes Relative 11/01/2020 5  % Final  . Monocytes Absolute 11/01/2020 0.7  0.1 - 1.0 K/uL Final  . Eosinophils Relative 11/01/2020 1  % Final  . Eosinophils Absolute 11/01/2020 0.1  0.0 - 0.5 K/uL Final  . Basophils Relative 11/01/2020 1  % Final  . Basophils Absolute 11/01/2020 0.1  0.0 - 0.1 K/uL Final  . Immature Granulocytes 11/01/2020 2  % Final  . Abs Immature Granulocytes 11/01/2020 0.20* 0.00 - 0.07 K/uL Final    Performed at [facility], [address], [place],  [place] [address]  . Sodium 11/01/2020 138  135 - 145 mmol/L Final  . Potassium 11/01/2020 3.8  3.5 - 5.1 mmol/L Final  . Chloride 11/01/2020 104  98 - 111 mmol/L Final  . CO2 11/01/2020 25  22 - 32 mmol/L Final  . Glucose, Bld 11/01/2020 85  70 - 99 mg/dL Final   Glucose reference range applies only to samples taken after fasting for at least 8 hours.  . BUN 11/01/2020 17  6 - 20 mg/dL Final  . Creatinine, Ser 11/01/2020 0.88  0.61 - 1.24 mg/dL Final  . Calcium 60/45/4098 9.2  8.9 - 10.3 mg/dL Final  . Total Protein 11/01/2020 6.8  6.5 - 8.1 g/dL Final  . Albumin 11/91/4782 4.3  3.5 - 5.0 g/dL Final  . AST 95/62/1308 34  15 - 41 U/L Final  . ALT 11/01/2020 36  0 - 44 U/L Final  . Alkaline Phosphatase 11/01/2020 85  38 - 126 U/L Final  . Total Bilirubin 11/01/2020 0.6  0.3 - 1.2 mg/dL Final  . GFR, Estimated 11/01/2020 >60  >60 mL/min Final   Comment: (NOTE) Calculated using the CKD-EPI Creatinine Equation (2021)   . Anion gap 11/01/2020 9  5 - 15 Final   Performed at [facility], [address] [place], [place] [address]  . Hgb A1c MFr Bld 11/01/2020 5.1  4.8 - 5.6 % Final   Comment: (NOTE) Pre diabetes:          5.7%-6.4%  Diabetes:              >6.4%  Glycemic control for   <7.0% adults with diabetes   . Mean Plasma Glucose 11/01/2020 99.67  mg/dL Final   Performed at [facility], [address], [place], [place] [address]  . Magnesium 11/01/2020 2.5* 1.7 - 2.4 mg/dL Final   Performed at [facility], [address] [place], [place] [address]  . Alcohol, Ethyl (B) 11/01/2020 156* <10 mg/dL Final   Comment: (NOTE) Lowest detectable limit for serum alcohol is 10 mg/dL.  For medical purposes only. Performed at [facility], [address], [place], [place] [address]   . TSH 11/01/2020 1.099  0.350 - 4.500 uIU/mL Final   Comment: Performed by a 3rd Generation assay with a functional  sensitivity of <=0.01 uIU/mL. Performed at [facility], [address] [place], [place] [address]   . Color, Urine 11/01/2020 YELLOW  YELLOW Final  . APPearance 11/01/2020 CLEAR  CLEAR Final  . Specific Gravity, Urine 11/01/2020 1.016  1.005 - 1.030 Final  . pH 11/01/2020 5.0  5.0 - 8.0 Final  . Glucose, UA 11/01/2020 NEGATIVE  NEGATIVE mg/dL Final  . Hgb urine dipstick 11/01/2020 NEGATIVE  NEGATIVE Final  . Bilirubin Urine 11/01/2020 NEGATIVE  NEGATIVE Final  . Ketones, ur 11/01/2020 NEGATIVE  NEGATIVE mg/dL Final  . Protein, ur 53/66/4403 NEGATIVE  NEGATIVE mg/dL Final  . Nitrite 47/42/5956 NEGATIVE  NEGATIVE Final  . Leukocytes,Ua 11/01/2020 TRACE* NEGATIVE Final  . RBC / HPF 11/01/2020 0-5  0 - 5 RBC/hpf Final  . WBC, UA 11/01/2020 0-5  0 - 5 WBC/hpf Final  . Bacteria, UA 11/01/2020 NONE SEEN  NONE SEEN Final  . Squamous Epithelial / LPF 11/01/2020 0-5  0 - 5 Final  . Mucus 11/01/2020 PRESENT   Final   Performed at [facility], [address], [place], [place] [address]  . POC Amphetamine UR 11/01/2020 None Detected  NONE DETECTED (Cut Off Level 1000 ng/mL) Final  . POC Secobarbital (BAR) 11/01/2020 None Detected  NONE DETECTED (Cut Off Level 300 ng/mL) Final  . POC Buprenorphine (BUP) 11/01/2020 None Detected  NONE DETECTED (Cut Off Level 10 ng/mL) Final  . POC Oxazepam (BZO) 11/01/2020 None Detected  NONE DETECTED (Cut Off Level 300 ng/mL) Final  . POC Cocaine UR 11/01/2020 None Detected  NONE DETECTED (Cut Off Level 300 ng/mL) Final  . POC Methamphetamine UR 11/01/2020 None Detected  NONE DETECTED (Cut Off Level 1000 ng/mL) Final  . POC Morphine 11/01/2020 None Detected  NONE DETECTED (Cut Off Level 300 ng/mL) Final  . POC Oxycodone UR 11/01/2020 None Detected  NONE DETECTED (Cut Off Level 100 ng/mL) Final  . POC Methadone UR 11/01/2020 None Detected  NONE DETECTED (Cut Off Level 300 ng/mL) Final  . POC Marijuana UR 11/01/2020 Positive* NONE DETECTED (Cut Off Level  50 ng/mL) Final  . Cholesterol 11/01/2020 254* 0 - 200 mg/dL Final  . Triglycerides 11/01/2020 396* <150 mg/dL Final  . HDL 78/29/5621 70  >40 mg/dL Final  . Total CHOL/HDL Ratio 11/01/2020 3.6  RATIO Final  . VLDL 11/01/2020 79* 0 - 40 mg/dL Final  . LDL Cholesterol 11/01/2020 105* 0 - 99 mg/dL Final   Comment:        Total Cholesterol/HDL:CHD Risk Coronary Heart Disease Risk Table                     Men   Women  1/2 Average Risk   3.4   3.3  Average Risk       5.0   4.4  2 X Average Risk   9.6   7.1  3 X Average Risk  23.4   11.0        Use the calculated Patient Ratio above and the CHD Risk Table to determine the patient's CHD Risk.        ATP III CLASSIFICATION (LDL):  <100     mg/dL   Optimal  308-657  mg/dL   Near or Above                    Optimal  130-159  mg/dL   Borderline  846-962  mg/dL   High  >952     mg/dL   Very High Performed at [facility], [address], [place], [place] [address]     Allergies: Cogentin [benztropine], Zyprexa [olanzapine], Celexa [citalopram hydrobromide], Depakote [valproic acid], Effexor [venlafaxine], Geodon [ziprasidone hcl], Haldol [haloperidol], Hydrochlorothiazide, Lasix [furosemide], Lexapro [escitalopram], Lisinopril, Lithium, Neurontin [gabapentin], Thorazine [chlorpromazine], Topamax [topiramate], Toradol [ketorolac tromethamine], and Tramadol  PTA Medications: (Not in a hospital admission)   Medical Decision Making  Admission orders placed  Initiate CIWA protocol -lorazepam 1 mg every 6 hours prn for CIWA >10 -thiamine 100 mg daily for nutritional supplementation -hydroxyzine 25 mg every 6 hours prn for anxiety, CIWA < or = 10 -ondansetron 4 mg ODT every 6 hours prn nausea/vomiting -loperamide 2-4 mg capsule prn diarrhea or loose stools  Restart Abilify 10 mg daily, first dose tonight Restart Seroquel 100 mg QHS   Clinical Course as of 02/18/21 0512  Thu Feb 18, 2021  0509 POCT Urine Drug Screen -  (ICup)(!) UDS positive for marijuana, otherwise negative [JB]  0509 Glucose(!): 135 Glucose 135-Cmp otherwise unremarkable [JB]  0510 TSH: 1.764 [JB]  0510 Alcohol, Ethyl (B)(!): 179 [JB]  0510 Cholesterol(!): 264 [JB]  0510 Triglycerides(!): 635 [JB]  0510 WBC(!): 11.9 WBC slightly elevated. Denies fever, chills, cough, SOB, N/V/D, recent illness [JB]    Clinical Course User Index [JB] Jackelyn Poling, NP    Recommendations  Based on my evaluation the patient does not appear to have an emergency medical condition.  Jackelyn Poling, NP 02/18/21  5:12 AM

## 2021-02-17 NOTE — H&P (Signed)
Behavioral Health Medical Screening Exam  Lance Abbott is a 40 y.o. male who presented to the [facility] as a walk-in with complaints of suicidal ideations with a plan, depression and substance abuse. He reported that he was dropped off to [facility] by his father and step-mother. Lance Abbott is known to the [facility] and has a significant psychiatric hx that includes MDD, alcohol dependence, polysubstance abuse and psychosis.   Lance Abbott states that today he found out that his mother has cancer tumors and he's been having thoughts to get a knife and cut them off of him in the same spots that his mother has them. He stated, "I could have them too." He endorses suicidal ideations with a plan to cut himself with a knife. He reported having access to knives. He reported that the thoughts to cut himself started today. He is unable to contract for safety at this time. He denies homicidal thoughts. He reported a past suicide attempt four months ago and stated that he overdosed on heroin.   On approach, he presents disheveled and reeks of alcohol. He is alert, oriented x 4 and cooperative. He reported drinking an 18 pack of beer yesterday and today, he consumed a pint of whisky. He reported that he last consumed alcohol this morning. He does not appear to be intoxicated, or in alcohol withdrawal. He reported smoking 40 cigarettes daily. He denied using illicit drugs. He endorsed auditory hallucinations, multiple voices coming from different people "like a crowd and the loudest wins." He denied visual hallucinations. He reported that he's been off his medications for ten days. He reported that he receives the Abilify injection monthly from the [facility], but was unable to identify the last time he received the injection. He denied having a therapist, or psychiatrist. He reported that he resides with his father and step-mother.    Per chart review: Patient was last seen for psychiatric treatment at  [facility] on 11/21/20, requesting alcohol detox. At that time, he was discharged to [facility] in [place] with a 7-day supply of sample medications. "Patient was restarted on Seroquel 100 mg p.o. nightly, Norvasc 10 mg p.o. daily, BuSpar 10 mg p.o. 3 times daily, Abilify 10 mg p.o. daily for 14 days, and Abilify maintain a 300 mg IM q. 28 days."   Total Time spent with patient: 15 minutes  Psychiatric Specialty Exam:  Presentation  General Appearance: Disheveled  Eye Contact:Fair  Speech:Clear and Coherent; Normal Rate  Speech Volume:Normal  Handedness:Right   Mood and Affect  Mood:Depressed  Affect:Congruent; Depressed   Thought Process  Thought Processes:Coherent  Descriptions of Associations:Intact  Orientation:Full (Time, Place and Person)  Thought Content:WDL  History of Schizophrenia/Schizoaffective disorder:No  Duration of Psychotic Symptoms:Greater than six months  Hallucinations:Hallucinations: None  Ideas of Reference:None  Suicidal Thoughts:Suicidal Thoughts: Yes, Active SI Active Intent and/or Plan: With Plan (plan to cut himself with a knife)  Homicidal Thoughts:Homicidal Thoughts: No   Sensorium  Memory:Immediate Fair; Recent Good; Remote Fair  Judgment:Poor  Insight:Fair   Executive Functions  Concentration:Fair  Attention Span:Fair  Recall:Fair  Fund of Knowledge:Fair  Language:Fair   Psychomotor Activity  Psychomotor Activity:No data recorded  Assets  Assets:Desire for Improvement; Leisure Time; Physical Health   Sleep  Sleep:Sleep: Fair Number of Hours of Sleep: 12    Physical Exam: Physical Exam HENT:     Head: Normocephalic and atraumatic.     Nose: Nose normal.  Cardiovascular:     Rate and Rhythm: Tachycardia present.  Comments: hypertensive Pulmonary:     Effort: Pulmonary effort is normal.  Musculoskeletal:        General: Normal range of motion.     Cervical back: Normal range of motion and neck supple.   Neurological:     General: No focal deficit present.     Mental Status: He is alert and oriented to person, place, and time.  Psychiatric:        Attention and Perception: Attention normal. He perceives auditory hallucinations. He does not perceive visual hallucinations.        Mood and Affect: Mood is depressed. Affect is flat.        Speech: Speech normal.        Behavior: Behavior normal. Behavior is cooperative.        Thought Content: Thought content is not paranoid or delusional. Thought content includes suicidal ideation. Thought content does not include homicidal ideation. Thought content includes suicidal plan. Thought content does not include homicidal plan.        Cognition and Memory: Cognition and memory normal.        Judgment: Judgment is not inappropriate.    Review of Systems  Constitutional: Negative.   HENT: Negative.   Eyes: Negative.   Respiratory: Negative.   Cardiovascular: Negative.   Gastrointestinal: Negative.   Genitourinary: Negative.   Musculoskeletal: Negative.   Skin: Negative.   Neurological: Negative.   Endo/Heme/Allergies: Negative.   Psychiatric/Behavioral: Positive for depression, hallucinations, substance abuse and suicidal ideas.   Blood pressure (!) 164/103, pulse (!) 125, temperature 98.2 F (36.8 C), temperature source Oral, SpO2 96 %. There is no height or weight on file to calculate BMI.  Musculoskeletal: Strength & Muscle Tone: within normal limits Gait & Station: normal Patient leans: N/A   Recommendations:  Based on my evaluation the patient does not appear to have an emergency medical condition.  Patient endorses active suicidal thoughts with a plan to use a knife. He is unable to contract for safety. Patient meets criteria for inpatient treatment. Patient will be transported to the [facility] for overnight observation, pending bed availability at [facility], per Fransico Michael, [facility].   Layla Barter, NP 02/17/2021, 10:16 PM

## 2021-02-17 NOTE — BH Assessment (Signed)
Disposition Otila Back, PA, patient

## 2021-02-17 NOTE — BH Assessment (Incomplete Revision)
Disposition Otila Back, NP,
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
## 2021-02-18 ENCOUNTER — Encounter (HOSPITAL_COMMUNITY): Payer: Self-pay | Admitting: Psychiatry

## 2021-02-18 ENCOUNTER — Inpatient Hospital Stay (HOSPITAL_COMMUNITY)
Admission: AD | Admit: 2021-02-18 | Discharge: 2021-02-24 | DRG: 897 | Disposition: A | Discharge status: Home / Self Care | Payer: Federal, State, Local not specified - Other | Source: Intra-hospital | Attending: Psychiatry | Admitting: Psychiatry

## 2021-02-18 DIAGNOSIS — F10239 Alcohol dependence with withdrawal, unspecified: Secondary | ICD-10-CM | POA: Diagnosis present

## 2021-02-18 DIAGNOSIS — F4310 Post-traumatic stress disorder, unspecified: Secondary | ICD-10-CM | POA: Diagnosis present

## 2021-02-18 DIAGNOSIS — F410 Panic disorder [episodic paroxysmal anxiety] without agoraphobia: Secondary | ICD-10-CM | POA: Diagnosis present

## 2021-02-18 DIAGNOSIS — F1910 Other psychoactive substance abuse, uncomplicated: Secondary | ICD-10-CM | POA: Diagnosis present

## 2021-02-18 DIAGNOSIS — R45851 Suicidal ideations: Secondary | ICD-10-CM | POA: Diagnosis present

## 2021-02-18 DIAGNOSIS — F1021 Alcohol dependence, in remission: Secondary | ICD-10-CM

## 2021-02-18 DIAGNOSIS — Z888 Allergy status to other drugs, medicaments and biological substances status: Secondary | ICD-10-CM

## 2021-02-18 DIAGNOSIS — F1410 Cocaine abuse, uncomplicated: Secondary | ICD-10-CM | POA: Diagnosis present

## 2021-02-18 DIAGNOSIS — Z8051 Family history of malignant neoplasm of kidney: Secondary | ICD-10-CM

## 2021-02-18 DIAGNOSIS — F209 Schizophrenia, unspecified: Secondary | ICD-10-CM | POA: Diagnosis present

## 2021-02-18 DIAGNOSIS — F319 Bipolar disorder, unspecified: Secondary | ICD-10-CM | POA: Diagnosis present

## 2021-02-18 DIAGNOSIS — F17210 Nicotine dependence, cigarettes, uncomplicated: Secondary | ICD-10-CM | POA: Diagnosis present

## 2021-02-18 DIAGNOSIS — Z885 Allergy status to narcotic agent status: Secondary | ICD-10-CM | POA: Diagnosis not present

## 2021-02-18 DIAGNOSIS — F1290 Cannabis use, unspecified, uncomplicated: Secondary | ICD-10-CM | POA: Diagnosis present

## 2021-02-18 LAB — CBC WITH DIFFERENTIAL/PLATELET
Abs Immature Granulocytes: 0.11 10*3/uL — ABNORMAL HIGH (ref 0.00–0.07)
Basophils Absolute: 0.1 10*3/uL (ref 0.0–0.1)
Basophils Relative: 1 %
Eosinophils Absolute: 0.2 10*3/uL (ref 0.0–0.5)
Eosinophils Relative: 1 %
HCT: 49 % (ref 39.0–52.0)
Hemoglobin: 16.8 g/dL (ref 13.0–17.0)
Immature Granulocytes: 1 %
Lymphocytes Relative: 35 %
Lymphs Abs: 4.2 10*3/uL — ABNORMAL HIGH (ref 0.7–4.0)
MCH: 31.7 pg (ref 26.0–34.0)
MCHC: 34.3 g/dL (ref 30.0–36.0)
MCV: 92.5 fL (ref 80.0–100.0)
Monocytes Absolute: 0.8 10*3/uL (ref 0.1–1.0)
Monocytes Relative: 7 %
Neutro Abs: 6.5 10*3/uL (ref 1.7–7.7)
Neutrophils Relative %: 55 %
Platelets: 354 10*3/uL (ref 150–400)
RBC: 5.3 MIL/uL (ref 4.22–5.81)
RDW: 13 % (ref 11.5–15.5)
WBC: 11.9 10*3/uL — ABNORMAL HIGH (ref 4.0–10.5)
nRBC: 0 % (ref 0.0–0.2)

## 2021-02-18 LAB — LIPID PANEL
Cholesterol: 264 mg/dL — ABNORMAL HIGH (ref 0–200)
HDL: 68 mg/dL (ref >40)
LDL Cholesterol: UNDETERMINED mg/dL (ref 0–99)
Total CHOL/HDL Ratio: 3.9 RATIO
Triglycerides: 635 mg/dL — ABNORMAL HIGH (ref <150)
VLDL: UNDETERMINED mg/dL (ref 0–40)

## 2021-02-18 LAB — POCT URINE DRUG SCREEN - MANUAL ENTRY (I-SCREEN)
POC Amphetamine UR: NOT DETECTED
POC Buprenorphine (BUP): NOT DETECTED
POC Cocaine UR: NOT DETECTED
POC Marijuana UR: POSITIVE — AB
POC Methadone UR: NOT DETECTED
POC Methamphetamine UR: NOT DETECTED
POC Morphine: NOT DETECTED
POC Oxazepam (BZO): NOT DETECTED
POC Oxycodone UR: NOT DETECTED
POC Secobarbital (BAR): NOT DETECTED

## 2021-02-18 LAB — COMPREHENSIVE METABOLIC PANEL
ALT: 34 U/L (ref 0–44)
AST: 30 U/L (ref 15–41)
Albumin: 4.8 g/dL (ref 3.5–5.0)
Alkaline Phosphatase: 81 U/L (ref 38–126)
Anion gap: 13 (ref 5–15)
BUN: 11 mg/dL (ref 6–20)
CO2: 21 mmol/L — ABNORMAL LOW (ref 22–32)
Calcium: 9.6 mg/dL (ref 8.9–10.3)
Chloride: 102 mmol/L (ref 98–111)
Creatinine, Ser: 0.99 mg/dL (ref 0.61–1.24)
GFR, Estimated: >60 mL/min (ref >60)
Glucose, Bld: 135 mg/dL — ABNORMAL HIGH (ref 70–99)
Potassium: 3.8 mmol/L (ref 3.5–5.1)
Sodium: 136 mmol/L (ref 135–145)
Total Bilirubin: 0.6 mg/dL (ref 0.3–1.2)
Total Protein: 7.3 g/dL (ref 6.5–8.1)

## 2021-02-18 LAB — HEMOGLOBIN A1C
Hgb A1c MFr Bld: 5.3 % (ref 4.8–5.6)
Mean Plasma Glucose: 105.41 mg/dL

## 2021-02-18 LAB — LDL CHOLESTEROL, DIRECT: Direct LDL: 126.2 mg/dL — ABNORMAL HIGH (ref 0–99)

## 2021-02-18 LAB — RESP PANEL BY RT-PCR (FLU A&B, COVID) ARPGX2
Influenza A by PCR: NEGATIVE
Influenza B by PCR: NEGATIVE
SARS Coronavirus 2 by RT PCR: NEGATIVE

## 2021-02-18 LAB — TSH: TSH: 1.764 u[IU]/mL (ref 0.350–4.500)

## 2021-02-18 LAB — ETHANOL: Alcohol, Ethyl (B): 179 mg/dL — ABNORMAL HIGH (ref <10)

## 2021-02-18 MED ORDER — TRAZODONE HCL 100 MG PO TABS: 100.0000 mg | ORAL_TABLET | Freq: Every evening | ORAL | Status: DC | PRN

## 2021-02-18 MED ORDER — ARIPIPRAZOLE 15 MG PO TABS
15.0000 mg | ORAL_TABLET | Freq: Every day | ORAL | Status: DC
  Administered 2021-02-18: 15 mg via ORAL
  Filled 2021-02-18: qty 1

## 2021-02-18 MED ORDER — ADULT MULTIVITAMIN W/MINERALS CH
1.0000 | ORAL_TABLET | Freq: Every day | ORAL | Status: DC
  Filled 2021-02-18: qty 1

## 2021-02-18 MED ORDER — LOPERAMIDE HCL 2 MG PO CAPS
2.0000 mg | ORAL_CAPSULE | ORAL | Status: AC | PRN
  Administered 2021-02-18 – 2021-02-20 (×3): 2 mg via ORAL
  Filled 2021-02-18: qty 2
  Filled 2021-02-18 (×2): qty 1

## 2021-02-18 MED ORDER — AMLODIPINE BESYLATE 10 MG PO TABS
10.0000 mg | ORAL_TABLET | Freq: Every day | ORAL | Status: DC
  Administered 2021-02-19 – 2021-02-24 (×6): 10 mg via ORAL
  Filled 2021-02-18 (×7): qty 1
  Filled 2021-02-18: qty 7

## 2021-02-18 MED ORDER — ARIPIPRAZOLE 15 MG PO TABS
15.0000 mg | ORAL_TABLET | Freq: Every day | ORAL | Status: DC
  Administered 2021-02-19 – 2021-02-24 (×6): 15 mg via ORAL
  Filled 2021-02-18 (×4): qty 1
  Filled 2021-02-18: qty 11
  Filled 2021-02-18 (×3): qty 1

## 2021-02-18 MED ORDER — FOLIC ACID 1 MG PO TABS
1.0000 mg | ORAL_TABLET | Freq: Every day | ORAL | Status: DC
  Administered 2021-02-19 – 2021-02-24 (×6): 1 mg via ORAL
  Filled 2021-02-18 (×7): qty 1
  Filled 2021-02-18: qty 7

## 2021-02-18 MED ORDER — NICOTINE 21 MG/24HR TD PT24
21.0000 mg | MEDICATED_PATCH | Freq: Every day | TRANSDERMAL | Status: DC
  Administered 2021-02-19 – 2021-02-24 (×6): 21 mg via TRANSDERMAL
  Filled 2021-02-18 (×2): qty 1
  Filled 2021-02-18: qty 7
  Filled 2021-02-18 (×5): qty 1

## 2021-02-18 MED ORDER — ONDANSETRON 4 MG PO TBDP
4.0000 mg | ORAL_TABLET | Freq: Four times a day (QID) | ORAL | Status: AC | PRN
  Administered 2021-02-18 – 2021-02-21 (×3): 4 mg via ORAL
  Filled 2021-02-18 (×3): qty 1

## 2021-02-18 MED ORDER — FOLIC ACID 1 MG PO TABS
1.0000 mg | ORAL_TABLET | Freq: Every day | ORAL | Status: DC
  Administered 2021-02-18: 1 mg via ORAL
  Filled 2021-02-18: qty 1

## 2021-02-18 MED ORDER — NICOTINE 21 MG/24HR TD PT24: 21.0000 mg | MEDICATED_PATCH | Freq: Every day | TRANSDERMAL | 0 refills | Status: DC

## 2021-02-18 MED ORDER — ARIPIPRAZOLE 15 MG PO TABS: 15.0000 mg | ORAL_TABLET | Freq: Every day | ORAL | Status: DC

## 2021-02-18 MED ORDER — THIAMINE HCL 100 MG PO TABS
100.0000 mg | ORAL_TABLET | Freq: Every day | ORAL | Status: DC
  Filled 2021-02-18: qty 1

## 2021-02-18 MED ORDER — HYDROXYZINE HCL 25 MG PO TABS
25.0000 mg | ORAL_TABLET | Freq: Four times a day (QID) | ORAL | Status: AC | PRN
  Administered 2021-02-18 – 2021-02-21 (×3): 25 mg via ORAL
  Filled 2021-02-18 (×3): qty 1

## 2021-02-18 MED ORDER — ADULT MULTIVITAMIN W/MINERALS CH
1.0000 | ORAL_TABLET | Freq: Every day | ORAL | Status: DC
  Administered 2021-02-19 – 2021-02-20 (×2): 1 via ORAL
  Filled 2021-02-18 (×7): qty 1
  Filled 2021-02-18: qty 7

## 2021-02-18 MED ORDER — THIAMINE HCL 100 MG PO TABS
100.0000 mg | ORAL_TABLET | Freq: Every day | ORAL | Status: DC
  Administered 2021-02-19 – 2021-02-24 (×6): 100 mg via ORAL
  Filled 2021-02-18 (×4): qty 1
  Filled 2021-02-18: qty 7
  Filled 2021-02-18 (×3): qty 1

## 2021-02-18 MED ORDER — LORAZEPAM 1 MG PO TABS
1.0000 mg | ORAL_TABLET | Freq: Four times a day (QID) | ORAL | Status: AC | PRN
  Administered 2021-02-18 – 2021-02-19 (×3): 1 mg via ORAL
  Filled 2021-02-18 (×3): qty 1

## 2021-02-18 MED ORDER — FOLIC ACID 1 MG PO TABS: 1.0000 mg | ORAL_TABLET | Freq: Every day | ORAL | Status: DC

## 2021-02-18 MED ORDER — THIAMINE HCL 100 MG PO TABS: 100.0000 mg | ORAL_TABLET | Freq: Every day | ORAL | Status: DC

## 2021-02-18 MED ORDER — BUSPIRONE HCL 10 MG PO TABS
10.0000 mg | ORAL_TABLET | Freq: Three times a day (TID) | ORAL | Status: DC
  Administered 2021-02-18 – 2021-02-23 (×16): 10 mg via ORAL
  Filled 2021-02-18 (×6): qty 1
  Filled 2021-02-18: qty 2
  Filled 2021-02-18 (×7): qty 1
  Filled 2021-02-18 (×2): qty 2
  Filled 2021-02-18 (×3): qty 1
  Filled 2021-02-18: qty 2
  Filled 2021-02-18 (×4): qty 1

## 2021-02-18 MED ORDER — ADULT MULTIVITAMIN W/MINERALS CH: 1.0000 | ORAL_TABLET | Freq: Every day | ORAL | Status: DC

## 2021-02-18 NOTE — Progress Notes (Signed)
Pt under review at [facility].

## 2021-02-18 NOTE — Progress Notes (Signed)
Lance Abbott transferred to [facility] per MD order. Discussed with the patient and all questions fully answered. An After Visit Summary, EMTALA and Med Necessity forms were printed and to be given to receiving nurse. Report given to [place], [place]. Patient escorted out and transferred via safe transport. [name]  02/18/2021 4:00 PM

## 2021-02-18 NOTE — Progress Notes (Signed)
[facility] Group Notes:  (Nursing/MHT/Case Management/Adjunct)  Date:  02/18/2021  Time:  2015  Type of Therapy:  wrap up group  Participation Level:  Active  Participation Quality:  Appropriate, Attentive, Sharing and Supportive  Affect:  Depressed and Irritable  Cognitive:  Alert  Insight:  Improving  Engagement in Group:  Engaged  Modes of Intervention:  Clarification, Education and Support  Summary of Progress/Problems: Positive thinking and positive change were discussed.   Lance Abbott 02/18/2021, 9:53 PM

## 2021-02-18 NOTE — Progress Notes (Signed)
Admission Note: Patient is a 40 year old male admitted to the unit  From [facility] for symptoms of depression, suicidal ideation and hallucinations.  Patient is alert and oriented x 4.  Presented with a blunted affect and depressed mood.  Stated his stressor is finding out his mom has kidney cancer.  Reports he is here to get his medication adjusted and to receive treatment for depression.  Admission plan of care reviewed and consent for treatment signed.  Skin assessment and personal belongings completed.  Skin is dry and intact.  No contraband found.  Patient oriented to the unit, staff and room.  Routine safety checks initiated.  Verbalizes understanding of unit rules and protocols.  Patient is safe on the unit at this time.
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
## 2021-02-18 NOTE — Tx Team (Signed)
Initial Treatment Plan 02/18/2021 5:48 PM [name] [id number]    PATIENT STRESSORS: Health problems Marital or family conflict Substance abuse   PATIENT STRENGTHS: Ability for insight Communication skills Motivation for treatment/growth   PATIENT IDENTIFIED PROBLEMS: "Medication Adjustment"  Depression  Suicidal ideation  Audiovisual hallucinations               DISCHARGE CRITERIA:  Ability to meet basic life and health needs Adequate post-discharge living arrangements Motivation to continue treatment in a less acute level of care  PRELIMINARY DISCHARGE PLAN: Attend aftercare/continuing care group Outpatient therapy Return to previous living arrangement  PATIENT/FAMILY INVOLVEMENT: This treatment plan has been presented to and reviewed with the patient, Lance Abbott.  The patient and family have been given the opportunity to ask questions and make suggestions.  Clarene Critchley, RN 02/18/2021, 5:48 PM

## 2021-02-18 NOTE — Progress Notes (Signed)
Patient was accepted to [facility].  Attending provider is Landry Mellow, MD.  Meets inpatient criteria per Otila Back, PA.  Patient is VOLUNTARY  Patient can be transported by [employer].   Notified Ryta Marquis Lunch, RN and Arnoldo Lenis, MD of acceptance  Nurses call report to [facility] [phone] Adult  Patient can arrive at 3pm.   [facility], LCSW, LCAS Clincal Social Worker  [facility]

## 2021-02-18 NOTE — Discharge Instructions (Addendum)
Patient is being transferred to [facility] for further care.

## 2021-02-18 NOTE — ED Notes (Signed)
Pt given lunch

## 2021-02-18 NOTE — ED Notes (Signed)
Pt given breakfast.

## 2021-02-18 NOTE — Progress Notes (Signed)
Pt is resting quietly. No signs of acute distress noted. Administered meds with no adverse effect. Pt endorses SI with plan to cut self. Pt verbally contracts for safety on unit. Pt endorses AVH in the form of "hearing and seeing angels and demons."  Pt complained of back pain, PRN Tylenol administered. Pt denies HI at this time. Staff will monitor for pt's safety.

## 2021-02-19 DIAGNOSIS — F1021 Alcohol dependence, in remission: Secondary | ICD-10-CM

## 2021-02-19 MED ORDER — LORAZEPAM 1 MG PO TABS: 1.0000 mg | ORAL_TABLET | Freq: Three times a day (TID) | ORAL | Status: DC

## 2021-02-19 MED ORDER — LORAZEPAM 1 MG PO TABS: 1.0000 mg | ORAL_TABLET | Freq: Two times a day (BID) | ORAL | Status: DC

## 2021-02-19 MED ORDER — TRAZODONE HCL 50 MG PO TABS
50.0000 mg | ORAL_TABLET | Freq: Every evening | ORAL | Status: DC | PRN
  Administered 2021-02-19 – 2021-02-20 (×2): 50 mg via ORAL
  Filled 2021-02-19 (×2): qty 1

## 2021-02-19 MED ORDER — LORAZEPAM 1 MG PO TABS
1.0000 mg | ORAL_TABLET | Freq: Four times a day (QID) | ORAL | Status: AC
  Administered 2021-02-19 – 2021-02-21 (×7): 1 mg via ORAL
  Filled 2021-02-19 (×7): qty 1

## 2021-02-19 MED ORDER — LORAZEPAM 1 MG PO TABS
1.0000 mg | ORAL_TABLET | Freq: Two times a day (BID) | ORAL | Status: AC
  Administered 2021-02-21 – 2021-02-23 (×4): 1 mg via ORAL
  Filled 2021-02-19 (×4): qty 1

## 2021-02-19 MED ORDER — HYDROXYZINE HCL 50 MG PO TABS
50.0000 mg | ORAL_TABLET | Freq: Every day | ORAL | Status: DC
  Administered 2021-02-19 – 2021-02-23 (×5): 50 mg via ORAL
  Filled 2021-02-19 (×4): qty 1
  Filled 2021-02-19: qty 7
  Filled 2021-02-19 (×3): qty 1

## 2021-02-19 NOTE — H&P (Signed)
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 improve and Ability to identify triggers associated with substance abuse/mental health issues will improve  I certify that inpatient services furnished can reasonably be expected to improve the patient's condition.    Armandina Stammer, NP, PMHNP, FNP-BC 4/15/20221:22 PM

## 2021-02-19 NOTE — Progress Notes (Signed)
PRN given for c/o withdrawal tremors, medication given as ordered. Staff will continue to monitor for changes in behavior/ condition.

## 2021-02-19 NOTE — Tx Team (Signed)
Interdisciplinary Treatment and Diagnostic Plan Update  02/19/2021 Time of Session: 1529 Lance Abbott MRN: [id number]  Principal Diagnosis: Alcohol use disorder, severe, in early remission [facility])  Secondary Diagnoses: Principal Problem:   Alcohol use disorder, severe, in early remission [facility]) Active Problems:   Schizophrenia (HCC)   Current Medications:  Current Facility-Administered Medications  Medication Dose Route Frequency Provider Last Rate Last Admin  . amLODipine (NORVASC) tablet 10 mg  10 mg Oral Daily Dagar, Geralynn Rile, MD   10 mg at 02/19/21 0825  . ARIPiprazole (ABILIFY) tablet 15 mg  15 mg Oral Daily Dagar, Anjali, MD   15 mg at 02/19/21 0825  . busPIRone (BUSPAR) tablet 10 mg  10 mg Oral TID Arnoldo Lenis, MD   10 mg at 02/19/21 1157  . folic acid (FOLVITE) tablet 1 mg  1 mg Oral Daily Dagar, Anjali, MD   1 mg at 02/19/21 0825  . hydrOXYzine (ATARAX/VISTARIL) tablet 25 mg  25 mg Oral Q6H PRN Nwoko, Uchenna E, PA   25 mg at 02/18/21 2249  . hydrOXYzine (ATARAX/VISTARIL) tablet 50 mg  50 mg Oral QHS Nwoko, Agnes I, NP      . loperamide (IMODIUM) capsule 2-4 mg  2-4 mg Oral PRN Nwoko, Uchenna E, PA   2 mg at 02/19/21 0829  . LORazepam (ATIVAN) tablet 1 mg  1 mg Oral Q6H PRN Nwoko, Uchenna E, PA   1 mg at 02/19/21 1452  . multivitamin with minerals tablet 1 tablet  1 tablet Oral Daily Nwoko, Uchenna E, PA   1 tablet at 02/19/21 0825  . nicotine (NICODERM CQ - dosed in mg/24 hours) patch 21 mg  21 mg Transdermal Daily Dagar, Geralynn Rile, MD   21 mg at 02/19/21 0825  . ondansetron (ZOFRAN-ODT) disintegrating tablet 4 mg  4 mg Oral Q6H PRN Nwoko, Uchenna E, PA   4 mg at 02/18/21 2249  . thiamine tablet 100 mg  100 mg Oral Daily Nwoko, Uchenna E, PA   100 mg at 02/19/21 0825   PTA Medications: Medications Prior to Admission  Medication Sig Dispense Refill Last Dose  . amLODipine (NORVASC) 10 MG tablet Take 1 tablet (10 mg total) by mouth daily. (Patient not taking: Reported on 02/18/2021)  30 tablet 0   . ARIPiprazole (ABILIFY) 15 MG tablet Take 1 tablet (15 mg total) by mouth daily.     . busPIRone (BUSPAR) 10 MG tablet Take 1 tablet (10 mg total) by mouth 3 (three) times daily. 90 tablet 0   . folic acid (FOLVITE) 1 MG tablet Take 1 tablet (1 mg total) by mouth daily.     . Multiple Vitamin (MULTIVITAMIN WITH MINERALS) TABS tablet Take 1 tablet by mouth daily.     . nicotine (NICODERM CQ - DOSED IN MG/24 HOURS) 21 mg/24hr patch Place 1 patch (21 mg total) onto the skin daily. 28 patch 0   . thiamine 100 MG tablet Take 1 tablet (100 mg total) by mouth daily.     Marland Kitchen tiZANidine (ZANAFLEX) 4 MG tablet Take 1 tablet (4 mg total) by mouth every 8 (eight) hours as needed for muscle spasms. 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   Physician Treatment Plan for Primary Diagnosis: Alcohol use disorder, severe, in early remission (HCC) Long  Term Goal(s): Improvement in symptoms so as ready for discharge Improvement in symptoms so as ready for discharge   Short Term Goals: Ability to identify changes in lifestyle to reduce recurrence of condition will improve Ability to verbalize feelings will improve Ability to disclose and discuss suicidal ideas Ability to demonstrate self-control will improve Ability to identify and develop effective coping behaviors will improve Compliance with prescribed medications will improve Ability to identify triggers associated with substance abuse/mental health issues will improve  Medication Management: Evaluate patient's response, side effects, and tolerance of medication regimen.  Therapeutic Interventions: 1 to 1 sessions, Unit Group sessions and  Medication administration.  Evaluation of Outcomes: Progressing  Physician Treatment Plan for Secondary Diagnosis: Principal Problem:   Alcohol use disorder, severe, in early remission (HCC) Active Problems:   Schizophrenia (HCC)  Long Term Goal(s): Improvement in symptoms so as ready for discharge Improvement in symptoms so as ready for discharge   Short Term Goals: Ability to identify changes in lifestyle to reduce recurrence of condition will improve Ability to verbalize feelings will improve Ability to disclose and discuss suicidal ideas Ability to demonstrate self-control will improve Ability to identify and develop effective coping behaviors will improve Compliance with prescribed medications will improve Ability to identify triggers associated with substance abuse/mental health issues will improve     Medication Management: Evaluate patient's response, side effects, and tolerance of medication regimen.  Therapeutic Interventions: 1 to 1 sessions, Unit Group sessions and Medication administration.  Evaluation of Outcomes: Progressing   RN Treatment Plan for Primary Diagnosis: Alcohol use disorder, severe, in early remission (HCC) Long Term Goal(s): Knowledge of disease and therapeutic regimen to maintain health will improve  Short Term Goals: Ability to remain free from injury will improve, Ability to disclose and discuss suicidal ideas, Ability to identify and develop effective coping behaviors will improve and Compliance with prescribed medications will improve  Medication Management: RN will administer medications as ordered by provider, will assess and evaluate patient's response and provide education to patient for prescribed medication. RN will report any adverse and/or side effects to prescribing provider.  Therapeutic Interventions: 1 on 1 counseling sessions, Psychoeducation, Medication administration, Evaluate responses to treatment, Monitor vital signs and CBGs as  ordered, Perform/monitor CIWA, COWS, AIMS and Fall Risk screenings as ordered, Perform wound care treatments as ordered.  Evaluation of Outcomes: Progressing   LCSW Treatment Plan for Primary Diagnosis: Alcohol use disorder, severe, in early remission [facility]) Long Term Goal(s): Safe transition to appropriate next level of care at discharge, Engage patient in therapeutic group addressing interpersonal concerns.  Short Term Goals: Engage patient in aftercare planning with referrals and resources, Increase ability to appropriately verbalize feelings, Increase emotional regulation, Identify triggers associated with mental health/substance abuse issues and Increase skills for wellness and recovery  Therapeutic Interventions: Assess for all discharge needs, 1 to 1 time with Social worker, Explore available resources and support systems, Assess for adequacy in community support network, Educate family and significant other(s) on suicide prevention, Complete Psychosocial Assessment, Interpersonal group therapy.  Evaluation of Outcomes: Progressing   Progress in Treatment: Attending groups: Yes. Participating in groups: Yes. Taking medication as prescribed: Yes. Toleration medication: Yes. Family/Significant other contact made: No, will contact:  mother. Patient understands diagnosis: Yes. Discussing patient identified problems/goals with staff: Yes. Medical problems stabilized or resolved: Yes. Denies suicidal/homicidal ideation: Yes. Issues/concerns per patient self-inventory: No. Other: N/A  New problem(s) identified: No, Describe:  none noted.  New Short Term/Long Term Goal(s): Safe transition  to appropriate next level of care at discharge, Engage patient in therapeutic group addressing interpersonal concerns.  Patient Goals:  "Get straight on medicine, quit drinking and get resources to help stay plugged in through cheap medicine or free medicine. Get my shot of abilify"  Discharge Plan  or Barriers: Pt to follow up with recommended level of care and medication management services.  Reason for Continuation of Hospitalization: Anxiety Depression Medication stabilization Suicidal ideation  Estimated Length of Stay: 3-5 days  Attendees: Patient: Lance Abbott 02/19/2021 3:29 PM  Physician: Dr. Fayrene Fearing, MD 02/19/2021 3:29 PM  Nursing:  02/19/2021 3:29 PM  RN Care Manager: 02/19/2021 3:29 PM  Social Worker: Cyril Loosen, LCSW 02/19/2021 3:29 PM  Recreational Therapist:  02/19/2021 3:29 PM  Other:  02/19/2021 3:29 PM  Other:  02/19/2021 3:29 PM  Other: 02/19/2021 3:29 PM    Scribe for Treatment Team: Leisa Lenz, LCSW 02/19/2021 3:29 PM

## 2021-02-19 NOTE — Progress Notes (Signed)
Patient endorses Passive SI and verbally contracted for safety Denies A/VH/HI. Ciwa. 11 this shift prn vistaril for anxiety, imodium for loose stool, ativan for Ciwa, and Zofran for nausea given and effective. Patient stated he drinks 18 pack of beer and a pint of whisky everyday. Support and encouragement provided as needed. Q 15 minutes safety checks ongoing without self harm gestures.

## 2021-02-19 NOTE — Progress Notes (Signed)
D- Patient alert and oriented. Patient affect/mood is good.  Denies SI, HI, AVH, and pain. Patient stated that he was drinking a lot of alcohol to help deal with the fact that his Mother is ill with cancer; and this helps him cope.Patient stated that he was experiencing tremors; mild trimmers observed.   A- Scheduled medications and PRN administered to patient, per MD orders. Support and encouragement provided.  Routine safety checks conducted every 15 minutes.  Patient informed to notify staff with problems or concerns.  R- No adverse drug reactions noted. Patient contracts for safety at this time. Patient compliant with medications and treatment plan. Patient receptive, calm, and cooperative. Patient interacts well with others on the unit.  Patient remains safe at this time.             [place] NOVEL CORONAVIRUS (COVID-19) DAILY CHECK-OFF SYMPTOMS - answer yes or no to each - every day NO YES  Have you had a fever in the past 24 hours?   Fever (Temp > 37.80C / 100F) X    Have you had any of these symptoms in the past 24 hours?  New Cough   Sore Throat    Shortness of Breath   Difficulty Breathing   Unexplained Body Aches   X    Have you had any one of these symptoms in the past 24 hours not related to allergies?    Runny Nose   Nasal Congestion   Sneezing   X    If you have had runny nose, nasal congestion, sneezing in the past 24 hours, has it worsened?   X    EXPOSURES - check yes or no X    Have you traveled outside the state in the past 14 days?   X    Have you been in contact with someone with a confirmed diagnosis of COVID-19 or PUI in the past 14 days without wearing appropriate PPE?   X    Have you been living in the same home as a person with confirmed diagnosis of COVID-19 or a PUI (household contact)?     X    Have you been diagnosed with COVID-19?     X                                                                                                                              What to do next: Answered NO to all: Answered YES to anything:    Proceed with unit schedule Follow the [facility] Inpatient Flowsheet.

## 2021-02-19 NOTE — [facility] Suicide Risk Assessment (Signed)
[facility] Admission Suicide Risk Assessment   Nursing information obtained from:  Patient Demographic factors:  Male,Low socioeconomic status Current Mental Status:  Self-harm thoughts Loss Factors:  Financial problems / change in socioeconomic status Historical Factors:  Prior suicide attempts Risk Reduction Factors:  Living with another person, especially a relative  Total Time spent with patient: 25 minutes Principal Problem: Alcohol use disorder, severe, in early remission (HCC) Diagnosis:  Principal Problem:   Alcohol use disorder, severe, in early remission (HCC) Active Problems:   Schizophrenia (HCC)  Subjective Data: Medical record reviewed.  I discussed patient's case in detail with members of the treatment team and the APP.  I met with and interviewed the patient today in the office on the unit.  The patient is a 40-year-old male with a history of alcohol and other substance use disorder and schizophrenia who was admitted for worsening depression, auditory hallucinations and suicidal ideation.  The patient states that he learned that his mother had cancer and the voices in his head told him to cut himself.  Patient reports that he felt like it might help his mother if he cut parts of his skin and body where his mother's tumors were.  He also reports that he had thoughts of ending his life.  Patient reports heavy alcohol use of an 18 pack of beer per day plus hard alcohol.  The patient reports that he had his last drink 2 days prior to admission.  He reports current symptoms of feeling depressed, anxious, insomnia, sweaty and shaky.  The patient reports that he continues to experience auditory hallucinations but denies hallucinations that are command to harm himself here in the hospital.  He reports that he got his last Abilify long-acting injectable shot over 6 weeks ago and is due for another one.  The patient states that he continues to experience passive wishes for death and vague nonspecific  suicidal thoughts but is not having thoughts of actually harming himself here in the hospital. He requests for increases in his BuSpar and oral Abilify.  He also requests for initiation of standing dose Seroquel and states he was taking 100 mg daily each morning and 300 mg each night.  Patient reports that he has been admitted to psychiatric hospitals in the past and has made 2 prior suicide attempts.  The most recent attempt was 4 to 5 months ago when he made a deliberate overdose on heroin.  He also reports a history of an attempt at age 20 by overdose on aspirin.  Patient reports family history of suicide in paternal aunt and paternal uncle.  I explained to patient that we would start standing dose lorazepam to reduce symptoms of alcohol withdrawal and continue as needed lorazepam for CIWA scores.  I encouraged him to drink significant p.o. fluids.  We discussed that it was important to defer increases in his BuSpar and Abilify doses or initiation of Seroquel until he was further through his withdrawal symptoms.  I expressed concern that increase BuSpar or Abilify might make the patient more anxious in the short-term.  Patient verbalized understanding.  Continued Clinical Symptoms:    The "Alcohol Use Disorders Identification Test", Guidelines for Use in Primary Care, Second Edition.  World Health Organization (WHO). Score between 0-7:  no or low risk or alcohol related problems. Score between 8-15:  moderate risk of alcohol related problems. Score between 16-19:  high risk of alcohol related problems. Score 20 or above:  warrants further diagnostic evaluation for alcohol dependence   and treatment.   CLINICAL FACTORS:   Alcohol/Substance Abuse/Dependencies Schizophrenia:   Depressive state Less than 59 years old Paranoid or undifferentiated type More than one psychiatric diagnosis Currently Psychotic Previous Psychiatric Diagnoses and Treatments   Musculoskeletal: Strength & Muscle Tone:  within normal limits Gait & Station: normal Patient leans: N/A  Psychiatric Specialty Exam:  Presentation  General Appearance: Appropriate for Environment  Eye Contact:Good  Speech:Normal Rate  Speech Volume:Normal  Handedness:Right   Mood and Affect  Mood:Anxious  Affect:Constricted   Thought Process  Thought Processes:Coherent  Descriptions of Associations:Intact  Orientation:Full (Time, Place and Person)  Thought Content:Illogical; Rumination  History of Schizophrenia/Schizoaffective disorder:Yes  Duration of Psychotic Symptoms:Greater than six months  Hallucinations:Hallucinations: Auditory Description of Auditory Hallucinations: Asking to harm himself by removing tumours from his own body to help treat mother Description of Visual Hallucinations: some mix of people  Ideas of Reference:None  Suicidal Thoughts:Suicidal Thoughts: Yes, Active SI Active Intent and/or Plan: With Intent; With Means to Carry Out  Homicidal Thoughts:Homicidal Thoughts: No   Sensorium  Memory:Immediate Good; Recent Good; Remote Fair  Judgment:Fair  Insight:Fair   Executive Functions  Concentration:Good  Attention Span:Good  [place] of Knowledge:Good  Language:Good   Psychomotor Activity  Psychomotor Activity:Psychomotor Activity: Normal   Assets  Assets:Communication Skills; Desire for Improvement; Housing; Resilience; Social Support   Sleep  Sleep:Sleep: Good    Physical Exam: Physical Exam Vitals and nursing note reviewed.  HENT:     Head: Normocephalic and atraumatic.  Neurological:     General: No focal deficit present.     Mental Status: He is alert and oriented to person, place, and time.    ROS Blood pressure (!) 139/95, pulse 97, temperature 97.7 F (36.5 C), temperature source Oral, resp. rate 19, height 5' 10" (1.778 m), weight 93 kg, SpO2 100 %. Body mass index is 29.41 kg/m.   COGNITIVE FEATURES THAT CONTRIBUTE TO RISK:   None    SUICIDE RISK:   Moderate:  Frequent suicidal ideation with limited intensity, and duration, some specificity in terms of plans, no associated intent, good self-control, limited dysphoria/symptomatology, some risk factors present, and identifiable protective factors, including available and accessible social support.  PLAN OF CARE: We will continue every 15-minute observation level for safety of patient.  Patient is on CIWA protocol.  We will start standing dose lorazepam 1 mg every 6 hours for 2 days then decrease to 1 mg twice a day standing dose lorazepam to cover for alcohol withdrawal.  We will also use PRN lorazepam for CIWA scores greater than 10.  Patient has been encouraged to increase oral intake of fluids to improve hydration.  Thiamine folic acid and multivitamin have been started.  Abilify 15 mg daily for psychotic symptoms has been initiated and patient is currently on BuSpar 10 mg 3 times daily for anxiety.  Available lab results reviewed.  I certify that inpatient services furnished can reasonably be expected to improve the patient's condition.   Arthor Captain, MD 02/19/2021, 5:41 PM

## 2021-02-19 NOTE — Progress Notes (Signed)
Recreation Therapy Notes  Date: 4.15.22 Time: 0930 Location: 300 Hall Dayroom  Group Topic: Stress Management   Goal Area(s) Addresses:  Patient will actively participate in stress management techniques presented during session.  Patient will successfully identify benefit of practicing stress management post d/c.   Behavioral Response: Appropriate  Intervention: Guided exercise with ambient sound and script  Activity :Guided Imagery  LRT read a script that focused on sitting peaceful at the beach and listening to the waves as they moved in and out at the shore. Patients were to listen and follow along as script was read to fully engage in activity.  Education:  Stress Management, Discharge Planning.   Education Outcome: Acknowledges education  Clinical Observations/Feedback: Patient actively engaged in technique introduced, expressed no concerns.    Caroll Rancher, LRT/CTRS         Caroll Rancher A 02/19/2021 10:38 AM

## 2021-02-19 NOTE — Plan of Care (Signed)
  Problem: Education: Goal: Emotional status will improve Outcome: Progressing Goal: Mental status will improve Outcome: Progressing   

## 2021-02-20 DIAGNOSIS — F1021 Alcohol dependence, in remission: Secondary | ICD-10-CM | POA: Diagnosis not present

## 2021-02-20 MED ORDER — TRAZODONE HCL 50 MG PO TABS
50.0000 mg | ORAL_TABLET | Freq: Every evening | ORAL | Status: DC | PRN
  Administered 2021-02-21 – 2021-02-23 (×7): 50 mg via ORAL
  Filled 2021-02-20 (×6): qty 1
  Filled 2021-02-20 (×2): qty 14
  Filled 2021-02-20 (×3): qty 1
  Filled 2021-02-20: qty 14
  Filled 2021-02-20 (×2): qty 1

## 2021-02-20 MED ORDER — ACETAMINOPHEN 500 MG PO TABS
500.0000 mg | ORAL_TABLET | Freq: Three times a day (TID) | ORAL | Status: DC | PRN
  Administered 2021-02-20 – 2021-02-24 (×7): 500 mg via ORAL
  Filled 2021-02-20 (×7): qty 1

## 2021-02-20 NOTE — [facility] Suicide Risk Assessment (Signed)
[facility] INPATIENT:  Family/Significant Other Suicide Prevention Education  Suicide Prevention Education:  Patient Refusal for Family/Significant Other Suicide Prevention Education: The patient Lance Abbott has refused to provide written consent for family/significant other to be provided Family/Significant Other Suicide Prevention Education during admission and/or prior to discharge.  Physician notified.  Evorn Gong 02/20/2021, 4:44 PM

## 2021-02-20 NOTE — Progress Notes (Addendum)
Pt visible in dayroom and hall, animated, interacting well with peers majority of this shift. Restless and anxious on interactions, observed with brief pacing at intervals. Noted with multiple demands for Ativan "I'm coming off etoh I want my ativan" which he had already received X2 at scheduled doses and did not meet criteria for PRN dose. Pt declined PRN Vistaril on initial approach for anxiety "no, I just want my Ativan" but later agreed to take it. Tolerated all meals and medications well. Report fair sleep last night with good appetite, good concentration level and normal energy on self inventory sheet. Rates his depression and hopelessness both 5/10 and anxiety 10/10. Safety checks maintained at Q 15 minutes intervals without incident or self harm gestures. Scheduled and PRN (see EMAR) medications given as ordered with verbal education and effects monitored. Support and encouragement offered. Pt remains safe on and off unit. No behavioral outburst to note thus far. Pt reports relief from nausea, anxiety when reassessed at 1540.
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
## 2021-02-21 DIAGNOSIS — F1021 Alcohol dependence, in remission: Secondary | ICD-10-CM

## 2021-02-21 LAB — LIPID PANEL
Cholesterol: 227 mg/dL — ABNORMAL HIGH (ref 0–200)
HDL: 62 mg/dL (ref >40)
LDL Cholesterol: 86 mg/dL (ref 0–99)
Total CHOL/HDL Ratio: 3.7 RATIO
Triglycerides: 395 mg/dL — ABNORMAL HIGH (ref <150)
VLDL: 79 mg/dL — ABNORMAL HIGH (ref 0–40)

## 2021-02-21 NOTE — Progress Notes (Signed)
   02/21/21 2115  COVID-19 Daily Checkoff  Have you had a fever (temp > 37.80C/100F)  in the past 24 hours?  No  If you have had runny nose, nasal congestion, sneezing in the past 24 hours, has it worsened? No  COVID-19 EXPOSURE  Have you traveled outside the state in the past 14 days? No  Have you been in contact with someone with a confirmed diagnosis of COVID-19 or PUI in the past 14 days without wearing appropriate PPE? No  Have you been living in the same home as a person with confirmed diagnosis of COVID-19 or a PUI (household contact)? No  Have you been diagnosed with COVID-19? No

## 2021-02-21 NOTE — Progress Notes (Signed)
Nurse called [facility] pharmacy phone [phone], spoke to [facility] about the 1800 ativan 1 mg p.o.  Pharmacist said it was OK to give this medicine but wait until closer to 7:00 pm.  Nurse agreed.

## 2021-02-21 NOTE — Progress Notes (Signed)
Patient has been observed up in the dayroom watching tv and interacting with peers. Writer spoke with him 1:1 and he reports that he is still experiencing withdrawals, tremors, diarrhea, and feeling nauseated. Writer has not seen any signs of tremors. He does comes to request ativan very frequently and will ask a different nurse other than his own nurse. The last 2 nights he wakes up close to or shortly after his midnight does is scheduled to come and request it.

## 2021-02-21 NOTE — Progress Notes (Signed)
   02/21/21 2115  Psych Admission Type (Psych Patients Only)  Admission Status Voluntary  Psychosocial Assessment  Eye Contact Fair  Facial Expression Anxious;Worried  Affect Appropriate to circumstance  Surveyor, minerals Activity Slow  Appearance/Hygiene Unremarkable  Behavior Characteristics Appropriate to situation  Mood Preoccupied (pt inquires about ativan constantly)  Thought Process  Content WDL  Delusions Paranoid  Perception Hallucinations  Hallucination Auditory;Visual  Judgment Limited  Confusion None  Danger to Self  Current suicidal ideation? Denies  Self-Injurious Behavior No self-injurious ideation or behavior indicators observed or expressed   Agreement Not to Harm Self Yes  Description of Agreement verbally contracts  Danger to Others  Danger to Others None reported or observed
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
## 2021-02-21 NOTE — [facility] Suicide Risk Assessment (Signed)
[facility] INPATIENT:  Family/Significant Other Suicide Prevention Education  Suicide Prevention Education:  Patient Refusal for Family/Significant Other Suicide Prevention Education: The patient Lance Abbott has refused to provide written consent for family/significant other to be provided Family/Significant Other Suicide Prevention Education during admission and/or prior to discharge.  Physician notified.  Carloyn Jaeger Grossman-Orr 02/21/2021, 8:36 AM

## 2021-02-21 NOTE — Progress Notes (Signed)
D:  Patient's self inventory sheet, patient has fair sleep, sleep medication helpful.  Good appetite, low energy level, good concentration.  Rated depression and hopeless 5, anxiety 8.  Withdrawals, tremors, chilling, cravings, agitation, nausea, runny nose, irritability.  Denied SI.  Physical problems, headaches, pain, sweating.  Worst pain #5 in past 24 hours.  Pain medicine helpful.  Goal is discharge.  Plans to "do good".  No discharge plans. A:  Medications administered per MD orders.  Emotional support and encouragement given patient. R:  Safety maintained with 15 minute checks.  Denied SI and HI, contracts for safety.  Stated he hears voices to hurt himself from the demons.  Hears voices from angels saying "don't hurt me".

## 2021-02-21 NOTE — [facility] Group Notes (Signed)
.  Psychoeducational Group Note  Date: 02/20/2021 Time: 0900-1000    Goal Setting   Purpose of Group: This group helps to provide patients with the steps of setting a goal that is specific, measurable, attainable, realistic and time specific. A discussion on how we keep ourselves stuck with negative self talk.    Participation Level:  Did not attend  Dione Housekeeper

## 2021-02-21 NOTE — Plan of Care (Signed)
Nurse discussed anxiety, depression and coping skills with patient.  

## 2021-02-22 MED ORDER — ARIPIPRAZOLE ER 400 MG IM SRER
400.0000 mg | INTRAMUSCULAR | Status: DC
  Administered 2021-02-22: 400 mg via INTRAMUSCULAR
  Filled 2021-02-22: qty 2

## 2021-02-22 NOTE — Progress Notes (Signed)
Pt visible on the unit this evening. Pt given PRN Tylenol per [facility] with his HS medication, for back pain 5 /10 with 10 being worst .     02/22/21 2300  Psych Admission Type (Psych Patients Only)  Admission Status Voluntary  Psychosocial Assessment  Patient Complaints Anxiety  Eye Contact Fair  Facial Expression Anxious;Worried  Affect Appropriate to circumstance  Surveyor, minerals Activity Slow  Appearance/Hygiene Unremarkable  Behavior Characteristics Anxious  Mood Preoccupied  Thought Process  Coherency WDL  Content WDL  Delusions Paranoid  Perception Hallucinations  Hallucination Auditory;Visual  Judgment Limited  Confusion None  Danger to Self  Current suicidal ideation? Denies  Self-Injurious Behavior No self-injurious ideation or behavior indicators observed or expressed   Agreement Not to Harm Self Yes  Description of Agreement verbally contracts  Danger to Others  Danger to Others None reported or observed

## 2021-02-22 NOTE — [facility] Group Notes (Signed)
Occupational Therapy Group Note Date: 02/22/2021 Group Topic/Focus: Feelings Management  Group Description: Group encouraged increased engagement and participation through discussion focused on Building Happiness. Patients were provided a handout and reviewed therapeutic strategies to build happiness including identifying gratitudes, random acts of kindness, exercise, meditation, positive journaling, and fostering relationships. Patients engaged in discussion and encouraged to reflect on each strategy and their experiences.  Therapeutic Goal(s): Identify strategies to build happiness. Identify and implement therapeutic strategies to improve overall mood. Practice and identify gratitudes, random acts of kindness, exercise, meditation, positive journaling, and fostering relationships Participation Level: Moderate   Participation Quality: Independent   Behavior: Calm and Cooperative   Speech/Thought Process: Focused   Affect/Mood: Euthymic   Insight: Limited   Judgement: Limited   Individualization: Lance Abbott was active in their participation of group discussion/activity. Pt identified happiness to him as "smiling and laughter" and identified "not having arguments" as something that makes him happy.   Modes of Intervention: Activity, Discussion, Education and Support  Patient Response to Interventions:  Attentive   Plan: Continue to engage patient in OT groups 2 - 3x/week.  02/22/2021  Donne Hazel, MOT, OTR/L
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
## 2021-02-22 NOTE — [facility] Group Notes (Signed)
Therapy Type: Group Therapy  Participation Level:  Active   Patients watched a Ted Talk titled Confessions of a Depressed Comic. Patients discussed to content of this video and discussed how it feels to deal with mental illness. Patients were given a worksheet with two stick figures, one titled "What I see" and "What others See". Patients were asked to write down what they see about themselves and what others think about them. Patients were then asked what were 5 things they wish people knew/understood about them. CSW initiated discussion based on patients worksheets. Patients discussed how others view them make them feel and what they wish was understood about them.  Patient Summary:  Lance Abbott stated that he is thankful for group.  Lance Abbott states that he sees himself as an addict but also as a loving person.  Lance Abbott feels that other people see him as nice and caring.  Lance Abbott accepted the handouts, watched the video, and participated in the group discussion.
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
## 2021-02-22 NOTE — Progress Notes (Signed)
Recreation Therapy Notes  Date:  4.18.22 Time: 0930 Location: [address] Dayroom  Group Topic: Stress Management  Goal Area(s) Addresses:  Patient will identify positive stress management techniques. Patient will identify benefits of using stress management post d/c.  Behavioral Response:  Engaged  Intervention:  Stress Management  Activity: Meditation.  LRT played a meditation that was a body scan which encouraged patients to identify any sensations, tightness, hot or cool feelings they may have been feeling throughout their bodies.  Patients were to acknowledge what they were feeling and to relax those areas for complete relaxation.    Education:  Stress Management, Discharge Planning.   Education Outcome: Acknowledges Education  Clinical Observations/Feedback: Patient attended and participated in group activity.   Lance Abbott Lance Abbott, LRT/CTRS         Lance Abbott A 02/22/2021 10:52 AM 

## 2021-02-23 MED ORDER — BUSPIRONE HCL 15 MG PO TABS
15.0000 mg | ORAL_TABLET | Freq: Three times a day (TID) | ORAL | Status: DC
  Administered 2021-02-24 (×2): 15 mg via ORAL
  Filled 2021-02-23 (×2): qty 1
  Filled 2021-02-23 (×2): qty 21
  Filled 2021-02-23 (×2): qty 1
  Filled 2021-02-23: qty 21

## 2021-02-23 MED ORDER — BUSPIRONE HCL 5 MG PO TABS
5.0000 mg | ORAL_TABLET | Freq: Once | ORAL | Status: AC
  Administered 2021-02-23: 5 mg via ORAL
  Filled 2021-02-23: qty 1

## 2021-02-23 NOTE — Progress Notes (Signed)
Pt stated he was doing better , pt visible on the unit. Pt given Vistaril and Trazodone x 2 per [facility] .     02/23/21 2300  Psych Admission Type (Psych Patients Only)  Admission Status Voluntary  Psychosocial Assessment  Patient Complaints Depression  Eye Contact Fair  Facial Expression Anxious;Worried  Affect Appropriate to circumstance  Surveyor, minerals Activity Slow  Appearance/Hygiene Unremarkable  Behavior Characteristics Cooperative  Mood Depressed;Preoccupied  Thought Process  Coherency WDL  Content WDL  Perception WDL  Hallucination None reported or observed  Judgment Limited  Confusion None  Danger to Self  Current suicidal ideation? Denies  Self-Injurious Behavior No self-injurious ideation or behavior indicators observed or expressed   Agreement Not to Harm Self Yes  Description of Agreement verbally contracts  Danger to Others  Danger to Others None reported or observed
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
## 2021-02-23 NOTE — Progress Notes (Signed)
Recreation Therapy Notes  Animal-Assisted Activity (AAA) Program Checklist/Progress Notes Patient Eligibility Criteria Checklist & Daily Group note for Rec Tx Intervention  Date: 4.19.22 Time: 1430 Location: [address]   AAA/T Program Assumption of Risk Form signed by Engineer, production or Parent Legal Guardian YES   Patient is free of allergies or severe asthma YES  Patient reports no fear of animals  YES  Patient reports no history of cruelty to animals YES   Patient understands his/her participation is voluntary YES  Patient washes hands before animal contact YES   Patient washes hands after animal contact  YES      Behavioral Response: Engaged  Education: Charity fundraiser, Appropriate Animal Interaction   Education Outcome: Acknowledges understanding/In group clarification offered/Needs additional education.   Clinical Observations/Feedback: Pt attended and participated in group.   Caroll Rancher, LRT/CTRS         Caroll Rancher A 02/23/2021 3:31 PM

## 2021-02-23 NOTE — [facility] Suicide Risk Assessment (Signed)
[facility] INPATIENT:  Family/Significant Other Suicide Prevention Education  Suicide Prevention Education:  Education Completed; Lance Abbott [phone] (Father) has been identified by the patient as the family member/significant other with whom the patient will be residing, and identified as the person(s) who will aid the patient in the event of a mental health crisis (suicidal ideations/suicide attempt).  With written consent from the patient, the family member/significant other has been provided the following suicide prevention education, prior to the and/or following the discharge of the patient.  The suicide prevention education provided includes the following:  Suicide risk factors  Suicide prevention and interventions  National Suicide Hotline telephone number  [facility] assessment telephone number  [facility] Emergency Assistance 911  [facility] and/or Residential Mobile Crisis Unit telephone number  Request made of family/significant other to:  Remove weapons (e.g., guns, rifles, knives), all items previously/currently identified as safety concern.    Remove drugs/medications (over-the-counter, prescriptions, illicit drugs), all items previously/currently identified as a safety concern.  The family member/significant other verbalizes understanding of the suicide prevention education information provided.  The family member/significant other agrees to remove the items of safety concern listed above.  CSW spoke with Mr. Hara who states that his son has been "drinking a lot for maybe a couple months".  Mr. Mcneel states that he has no concerns about his sons drinking.  Mr. Hosking states that there are no firearms or weapons in the home and that his son is welcome to come home at any time. Mr. Mells stated that he had no other concerns or questions.  CSW completed SPE with Mr. Gunn.   Metro Kung Lance Abbott 02/23/2021, 1:56 PM

## 2021-02-23 NOTE — Progress Notes (Signed)
   02/23/21 0618  Vital Signs  Temp 97.9 F (36.6 C)  Temp Source Oral  Pulse Rate 91  Pulse Rate Source Monitor  BP 122/88  BP Location Right Arm  BP Method Automatic  Patient Position (if appropriate) Sitting  Oxygen Therapy  SpO2 100 %   D: Patient denies SI/HI/AVH. Patient denied anxiety but rated depression 5/10. Pt. Complained of back pain. Pt stated "I just found out my mom has stage 4 cancer." A:  Patient took scheduled medicine.Pt refused pain meds.   Support and encouragement provided Routine safety checks conducted every 15 minutes. Patient  Informed to notify staff with any concerns.   R: Safety maintained.

## 2021-02-24 DIAGNOSIS — F1021 Alcohol dependence, in remission: Secondary | ICD-10-CM | POA: Diagnosis not present

## 2021-02-24 MED ORDER — THIAMINE HCL 100 MG PO TABS: 100.0000 mg | ORAL_TABLET | Freq: Every day | ORAL | 0 refills | Status: DC

## 2021-02-24 MED ORDER — HYDROXYZINE HCL 50 MG PO TABS: 50.0000 mg | ORAL_TABLET | Freq: Every day | ORAL | 0 refills | Status: DC

## 2021-02-24 MED ORDER — FOLIC ACID 1 MG PO TABS: 1.0000 mg | ORAL_TABLET | Freq: Every day | ORAL | 0 refills | Status: DC

## 2021-02-24 MED ORDER — AMLODIPINE BESYLATE 10 MG PO TABS: 10.0000 mg | ORAL_TABLET | Freq: Every day | ORAL | 0 refills | Status: DC

## 2021-02-24 MED ORDER — ARIPIPRAZOLE ER 400 MG IM SRER: 400.0000 mg | INTRAMUSCULAR | 0 refills | Status: DC

## 2021-02-24 MED ORDER — BUSPIRONE HCL 15 MG PO TABS: 15.0000 mg | ORAL_TABLET | Freq: Three times a day (TID) | ORAL | 0 refills | Status: DC

## 2021-02-24 MED ORDER — NICOTINE 21 MG/24HR TD PT24: 21.0000 mg | MEDICATED_PATCH | Freq: Every day | TRANSDERMAL | 0 refills | Status: DC

## 2021-02-24 MED ORDER — TRAZODONE HCL 50 MG PO TABS: 50.0000 mg | ORAL_TABLET | Freq: Every evening | ORAL | 0 refills | Status: DC | PRN

## 2021-02-24 MED ORDER — TRAZODONE HCL 50 MG PO TABS
50.0000 mg | ORAL_TABLET | Freq: Every evening | ORAL | Status: DC | PRN
  Filled 2021-02-24: qty 7

## 2021-02-24 MED ORDER — ARIPIPRAZOLE 15 MG PO TABS: 15.0000 mg | ORAL_TABLET | Freq: Every day | ORAL | 0 refills | Status: DC

## 2021-02-24 NOTE — [facility] Suicide Risk Assessment (Signed)
[facility] Discharge Suicide Risk Assessment   Principal Problem: Alcohol use disorder, severe, in early remission [facility]) Discharge Diagnoses: Principal Problem:   Alcohol use disorder, severe, in early remission ([facility]) Active Problems:   Polysubstance abuse (H[facility])   Schizophrenia ([facility])   Total Time spent with patient: 20 minutes  Musculoskeletal: Strength & Muscle Tone: within normal limits Gait & Station: normal Patient leans: N/A  Psychiatric Specialty Exam: Review of Systems  Constitutional: Negative for activity change.  HENT: Negative for congestion.   Eyes: Negative for discharge.  Respiratory: Negative for cough and shortness of breath.   Cardiovascular: Negative for chest pain and palpitations.  Gastrointestinal: Negative for nausea and rectal pain.  Genitourinary: Negative for difficulty urinating.  Musculoskeletal: Positive for back pain.       Chronic back pain  Neurological: Negative for dizziness, tremors and headaches.  Psychiatric/Behavioral: Negative for dysphoric mood, hallucinations, self-injury and suicidal ideas.    Blood pressure 125/82, pulse (!) 108, temperature 97.8 F (36.6 C), temperature source Oral, resp. rate 16, height 5\' 10"  (1.778 m), weight 93 kg, SpO2 98 %.Body mass index is 29.41 kg/m.  General Appearance: Casual and Fairly Groomed  ::  Good  Speech:  Clear and Coherent and Normal Rate  Volume:  Normal  Mood:  Euthymic  Affect:  Appropriate and Congruent  Thought Process:  Coherent, Goal Directed and Linear  Orientation:  Full (Time, Place, and Person)  Thought Content:  Logical.  No paranoid ideation, no delusional content elicited.  Denies hallucinations.  Suicidal Thoughts:  No  Homicidal Thoughts:  No  Memory:  Immediate;   Good Recent;   Good Remote;   Good  Judgement:  Fair  Insight:  Fair  Psychomotor Activity:  Normal  Concentration:  Good  Recall:  Good  Fund of Knowledge:Good  Language: Good  Akathisia:  No     AIMS (if indicated):     Assets:  Communication Skills Desire for Improvement Housing Physical Health Resilience Social Support  Sleep:  Number of Hours: 6.5  Cognition: WNL  ADL's:  Intact   Mental Status Per Nursing Assessment::   On Admission:  Self-harm thoughts  Demographic Factors:  Male, Caucasian and Low socioeconomic status  Loss Factors: Financial problems/change in socioeconomic status  Historical Factors: Prior suicide attempts, Family history of suicide and Family history of mental illness or substance abuse  Risk Reduction Factors:   Sense of responsibility to family, Living with another person, especially a relative and Positive social support  Continued Clinical Symptoms:  Anxiety - improved on medications Alcohol/Substance Abuse/Dependencies Schizophrenia by history - symptoms currently well controlled on medication Chronic Pain More than one psychiatric diagnosis Previous Psychiatric Diagnoses and Treatments  Cognitive Features That Contribute To Risk:  None    Suicide Risk:  Minimal: No identifiable suicidal ideation.  Patients presenting with no risk factors but with morbid ruminations; may be classified as minimal risk based on the severity of the depressive symptoms   Follow-up Information    Services, [facility]. Go on 02/26/2021.   Why: You have an appointment for therapy and medication management services on 02/26/21 at 10:00 am.  This appointment will be held in person.   Contact information: [address] [place] [phone]        Center, [phone] Medical. Go on 03/01/2021.   Why: You have an assessment and screening appointment with [facility] to obtain primary care services and medication assistance.  This appointment will be  held in person on 03/01/21 at 11:00 am.  Contact information: [address] [place] [address] [phone]               Plan Of Care/Follow-up recommendations:   Activity:  as tolerated  Tests:  You will need to have blood drawn periodically for lab tests to monitor your cholesterol and blood sugar while taking Abilify (whether taking by mouth or by long acting injection).  Your outpatient doctor will order lab work as indicated.  Other:   - You received your last Abilify Maintena injection of 400 mg IM on 02/22/2021.  You should continue to take Abilify by mouth for the next 11 days (02/25/2021 through 03/07/2021).  You should receive an injection of Abilify Maintena 400 mg IM every month or as prescribed by your outpatient psychiatrist. Your next Abilify Maintena injection of 400 mg should be given on 03/24/2021.   - Take medications as prescribed.   - Keep outpatient appointments with outpatient psychiatrist and therapist.   - Do not use alcohol.  Do not use marijuana or other drugs.  - You have been advised to participate in substance use disorder treatment as an outpatient after discharge and you have been provided information and resources about treatment for substance abuse.   - You should see your primary care provider for management of your blood pressure, pain, blood sugar, cholesterol and any other physical issues.  Claudie Revering, MD 02/24/2021, 7:30 AM

## 2021-02-24 NOTE — Plan of Care (Signed)
Nurse discussed anxiety, depression with patient.

## 2021-02-24 NOTE — Progress Notes (Signed)
Discharge  Note:  Patient discharged home with dad.  Patient denied SI and HI.  Denied A/V hallucinations.  Suicide prevention information given and discussed with patient who stated he understood and had no questions.  Patient stated he received all his belongings, clothing, toiletries, misc items, etc.  Patient stated he appreciated all assistance received from [facility] staff.  All required discharge information given.

## 2021-02-24 NOTE — Progress Notes (Signed)
Recreation Therapy Notes  Date: 4.20.22 Time: 0930 Location: [address]  Group Topic: Stress Management   Goal Area(s) Addresses:  Patient will actively participate in stress management techniques presented during session.  Patient will successfully identify benefit of practicing stress management post d/c.   Behavioral Response: Appropriate  Intervention: Guided exercise with ambient sound and script  Activity :Guided Imagery  LRT read a script that encouraged patients to envision being in their peaceful place.  Patients were to imagine the sights, sounds and smells of the place they are envisioning.    Education:  Stress Management, Discharge Planning.   Education Outcome: Acknowledges education  Clinical Observations/Feedback: Patient attended and participated in group session.   Caroll Rancher, LRT/CTRS         Caroll Rancher A 02/24/2021 11:33 AM

## 2021-02-24 NOTE — [facility] Group Notes (Signed)
The focus of this group is to help patients establish daily goals to achieve during treatment and discuss how the patient can incorporate goal setting into their daily lives to aide in recovery.  Pt attended and participated in group 

## 2021-02-24 NOTE — Progress Notes (Signed)
D:  Patient's self inventory sheet, patient sleeps good, no sleep medication.  Good appetite, normal energy level, good concentration.  Denied depression, rated hopeless 1, anxiety  2.  Denied withdrawals.  Denied SI.  Denied physical pain.  Goal is be able to continue to be of sound mind and body.  Exercise.  Plans to be positive and continue meds,  Does have discharge plans. A:  Medications administered per MD orders.  Emotional support and encouragement given patient. R:  Denied SI and HI, contracts for safety.  Denied A/V hallucinations.  Denied pain.  Safety maintained with 15 minute checks.
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
## 2021-02-24 NOTE — Progress Notes (Signed)
  [facility] Adult Case Management Discharge Plan :  Will you be returning to the same living situation after discharge:  Yes,  Home  At discharge, do you have transportation home?: Yes,  Father Do you have the ability to pay for your medications: Yes,  Family   Release of information consent forms completed and in the chart;  Patient's signature needed at discharge.  Patient to Follow up at:  Follow-up Information    [facility], [facility]. Go on 02/26/2021.   Why: You have an appointment for therapy and medication management services on 02/26/21 at 10:00 am.  This appointment will be held in person.  You may ask them for substance abuse therapy services at this time if you are interested.  Contact information: [address] [place] [address] [phone]        [facility]. Go on 03/01/2021.   Why: You have an assessment and screening appointment with [facility] to obtain primary care services and medication assistance.  This appointment will be held in person on 03/01/21 at 11:00 am.  Contact information: [address] [place] [address] [phone]               Next level of care provider has access to [facility] Link:no  Safety Planning and Suicide Prevention discussed: Yes,  with patient and father   Have you used any form of tobacco in the last 30 days? (Cigarettes, Smokeless Tobacco, Cigars, and/or Pipes): Yes  Has patient been referred to the Quitline?: Patient refused referral  Patient has been referred for addiction treatment: Yes  Aram Beecham, LCSWA 02/24/2021, 9:39 AM

## 2021-02-24 NOTE — Tx Team (Signed)
Interdisciplinary Treatment and Diagnostic Plan Update  02/24/2021 Time of Session: 1529 Lance Abbott MRN: [id number]  Principal Diagnosis: Alcohol use disorder, severe, in early remission [facility])  Secondary Diagnoses: Principal Problem:   Alcohol use disorder, severe, in early remission [facility]) Active Problems:   Polysubstance abuse (HCC)   Schizophrenia (HCC)   Current Medications:  Current Facility-Administered Medications  Medication Dose Route Frequency Provider Last Rate Last Admin  . acetaminophen (TYLENOL) tablet 500 mg  500 mg Oral Q8H PRN Comer Locket, MD   500 mg at 02/24/21 0747  . amLODipine (NORVASC) tablet 10 mg  10 mg Oral Daily Dagar, Geralynn Rile, MD   10 mg at 02/24/21 0744  . ARIPiprazole (ABILIFY) tablet 15 mg  15 mg Oral Daily Dagar, Anjali, MD   15 mg at 02/24/21 0744  . ARIPiprazole ER (ABILIFY MAINTENA) injection 400 mg  400 mg Intramuscular Q28 days Claudie Revering, MD   400 mg at 02/22/21 1536  . busPIRone (BUSPAR) tablet 15 mg  15 mg Oral TID Claudie Revering, MD   15 mg at 02/24/21 0744  . folic acid (FOLVITE) tablet 1 mg  1 mg Oral Daily Dagar, Anjali, MD   1 mg at 02/24/21 0744  . hydrOXYzine (ATARAX/VISTARIL) tablet 50 mg  50 mg Oral QHS Armandina Stammer I, NP   50 mg at 02/23/21 2106  . multivitamin with minerals tablet 1 tablet  1 tablet Oral Daily Nwoko, Uchenna E, PA   1 tablet at 02/20/21 0833  . nicotine (NICODERM CQ - dosed in mg/24 hours) patch 21 mg  21 mg Transdermal Daily Dagar, Geralynn Rile, MD   21 mg at 02/24/21 0746  . thiamine tablet 100 mg  100 mg Oral Daily Nwoko, Uchenna E, PA   100 mg at 02/24/21 0745  . traZODone (DESYREL) tablet 50 mg  50 mg Oral QHS,MR X 1 Laveda Abbe, NP   50 mg at 02/23/21 2202  . traZODone (DESYREL) tablet 50 mg  50 mg Oral QHS PRN Seaver, Hailey B, RPH       PTA Medications: Medications Prior to Admission  Medication Sig Dispense Refill Last Dose  . busPIRone (BUSPAR) 10 MG tablet Take 1 tablet (10 mg total) by mouth  3 (three) times daily. 90 tablet 0   . Multiple Vitamin (MULTIVITAMIN WITH MINERALS) TABS tablet Take 1 tablet by mouth daily.     Marland Kitchen tiZANidine (ZANAFLEX) 4 MG tablet Take 1 tablet (4 mg total) by mouth every 8 (eight) hours as needed for muscle spasms. 2 tablet 0   . [DISCONTINUED] amLODipine (NORVASC) 10 MG tablet Take 1 tablet (10 mg total) by mouth daily. (Patient not taking: Reported on 02/18/2021) 30 tablet 0   . [DISCONTINUED] ARIPiprazole (ABILIFY) 15 MG tablet Take 1 tablet (15 mg total) by mouth daily.     . [DISCONTINUED] folic acid (FOLVITE) 1 MG tablet Take 1 tablet (1 mg total) by mouth daily.     . [DISCONTINUED] nicotine (NICODERM CQ - DOSED IN MG/24 HOURS) 21 mg/24hr patch Place 1 patch (21 mg total) onto the skin daily. 28 patch 0   . [DISCONTINUED] thiamine 100 MG tablet Take 1 tablet (100 mg total) by mouth daily.       Patient Stressors: Health problems Marital or family conflict Substance abuse  Patient Strengths: Ability for Warden/ranger for treatment/growth  Treatment Modalities: Medication Management, Group therapy, Case management,  1 to 1 session with clinician, Psychoeducation, Recreational therapy.   Physician  Treatment Plan for Primary Diagnosis: Alcohol use disorder, severe, in early remission (HCC) Long Term Goal(s): Improvement in symptoms so as ready for discharge Improvement in symptoms so as ready for discharge   Short Term Goals: Ability to identify changes in lifestyle to reduce recurrence of condition will improve Ability to verbalize feelings will improve Ability to disclose and discuss suicidal ideas Ability to demonstrate self-control will improve Ability to identify and develop effective coping behaviors will improve Compliance with prescribed medications will improve Ability to identify triggers associated with substance abuse/mental health issues will improve  Medication Management: Evaluate patient's response,  side effects, and tolerance of medication regimen.  Therapeutic Interventions: 1 to 1 sessions, Unit Group sessions and Medication administration.  Evaluation of Outcomes: Adequate for Discharge  Physician Treatment Plan for Secondary Diagnosis: Principal Problem:   Alcohol use disorder, severe, in early remission (HCC) Active Problems:   Polysubstance abuse (HCC)   Schizophrenia (HCC)  Long Term Goal(s): Improvement in symptoms so as ready for discharge Improvement in symptoms so as ready for discharge   Short Term Goals: Ability to identify changes in lifestyle to reduce recurrence of condition will improve Ability to verbalize feelings will improve Ability to disclose and discuss suicidal ideas Ability to demonstrate self-control will improve Ability to identify and develop effective coping behaviors will improve Compliance with prescribed medications will improve Ability to identify triggers associated with substance abuse/mental health issues will improve     Medication Management: Evaluate patient's response, side effects, and tolerance of medication regimen.  Therapeutic Interventions: 1 to 1 sessions, Unit Group sessions and Medication administration.  Evaluation of Outcomes: Adequate for Discharge   RN Treatment Plan for Primary Diagnosis: Alcohol use disorder, severe, in early remission (HCC) Long Term Goal(s): Knowledge of disease and therapeutic regimen to maintain health will improve  Short Term Goals: Ability to remain free from injury will improve, Ability to disclose and discuss suicidal ideas, Ability to identify and develop effective coping behaviors will improve and Compliance with prescribed medications will improve  Medication Management: RN will administer medications as ordered by provider, will assess and evaluate patient's response and provide education to patient for prescribed medication. RN will report any adverse and/or side effects to prescribing  provider.  Therapeutic Interventions: 1 on 1 counseling sessions, Psychoeducation, Medication administration, Evaluate responses to treatment, Monitor vital signs and CBGs as ordered, Perform/monitor CIWA, COWS, AIMS and Fall Risk screenings as ordered, Perform wound care treatments as ordered.  Evaluation of Outcomes: Adequate for Discharge   LCSW Treatment Plan for Primary Diagnosis: Alcohol use disorder, severe, in early remission [facility]) Long Term Goal(s): Safe transition to appropriate next level of care at discharge, Engage patient in therapeutic group addressing interpersonal concerns.  Short Term Goals: Engage patient in aftercare planning with referrals and resources, Increase ability to appropriately verbalize feelings, Increase emotional regulation, Identify triggers associated with mental health/substance abuse issues and Increase skills for wellness and recovery  Therapeutic Interventions: Assess for all discharge needs, 1 to 1 time with Social worker, Explore available resources and support systems, Assess for adequacy in community support network, Educate family and significant other(s) on suicide prevention, Complete Psychosocial Assessment, Interpersonal group therapy.  Evaluation of Outcomes: Adequate for Discharge   Progress in Treatment: Attending groups: Yes. Participating in groups: Yes. Taking medication as prescribed: Yes. Toleration medication: Yes. Family/Significant other contact made: Yes, individual(s) contacted:  mother Patient understands diagnosis: Yes. Discussing patient identified problems/goals with staff: Yes. Medical problems stabilized or resolved: Yes. Denies  suicidal/homicidal ideation: Yes. Issues/concerns per patient self-inventory: No. Other: N/A  New problem(s) identified: No, Describe:  none noted.  New Short Term/Long Term Goal(s): Safe transition to appropriate next level of care at discharge, Engage patient in therapeutic group addressing  interpersonal concerns.  Patient Goals:  "Get straight on medicine, quit drinking and get resources to help stay plugged in through cheap medicine or free medicine. Get my shot of abilify"  Discharge Plan or Barriers: Pt to follow up with recommended level of care and medication management services.  Reason for Continuation of Hospitalization: Anxiety Depression Medication stabilization Suicidal ideation  Estimated Length of Stay: 3-5 days  Attendees: Patient: Lance Abbott 02/24/2021 10:39 AM  Physician: Dr. Fayrene Fearing, MD 02/24/2021 10:39 AM  Nursing:  02/24/2021 10:39 AM  RN Care Manager: 02/24/2021 10:39 AM  Social Worker: Cyril Loosen, LCSW 02/24/2021 10:39 AM  Recreational Therapist:  02/24/2021 10:39 AM  Other:  02/24/2021 10:39 AM  Other:  02/24/2021 10:39 AM  Other: 02/24/2021 10:39 AM    Scribe for Treatment Team: Felizardo Hoffmann, LCSWA 02/24/2021 10:39 AM

## 2021-02-24 NOTE — Progress Notes (Signed)
   02/24/21 0622  Vital Signs  Pulse Rate (!) 108  Pulse Rate Source Monitor  BP 125/82  BP Location Right Arm  BP Method Automatic  Patient Position (if appropriate) Standing  Oxygen Therapy  SpO2 98 %  Sleep  Number of Hours 6.5

## 2021-04-24 ENCOUNTER — Ambulatory Visit (HOSPITAL_COMMUNITY)
Admission: RE | Admit: 2021-04-24 | Discharge: 2021-04-24 | Disposition: A | Discharge status: Home / Self Care | Payer: No Typology Code available for payment source | Attending: Psychiatry | Admitting: Psychiatry

## 2021-04-24 DIAGNOSIS — Z0279 Encounter for issue of other medical certificate: Secondary | ICD-10-CM | POA: Insufficient documentation

## 2021-04-24 DIAGNOSIS — F1420 Cocaine dependence, uncomplicated: Secondary | ICD-10-CM | POA: Insufficient documentation

## 2021-04-24 DIAGNOSIS — F10239 Alcohol dependence with withdrawal, unspecified: Secondary | ICD-10-CM | POA: Insufficient documentation

## 2021-04-24 DIAGNOSIS — F1020 Alcohol dependence, uncomplicated: Secondary | ICD-10-CM | POA: Insufficient documentation

## 2021-04-24 DIAGNOSIS — F250 Schizoaffective disorder, bipolar type: Secondary | ICD-10-CM | POA: Insufficient documentation

## 2021-04-24 DIAGNOSIS — Z5321 Procedure and treatment not carried out due to patient leaving prior to being seen by health care provider: Secondary | ICD-10-CM | POA: Insufficient documentation

## 2021-04-24 DIAGNOSIS — Y907 Blood alcohol level of 200-239 mg/100 ml: Secondary | ICD-10-CM | POA: Insufficient documentation

## 2021-04-24 NOTE — H&P (Signed)
Behavioral Health Medical Screening Exam  Lance Abbott is a 40 y.o. male presenting to the [facility] unaccompanied for AVH. Patient states that he has been seeing thing and hearing things.  Patient reports that he was recently discharged from [facility] about a month or so ago.  Patient states that he was unable to afford the medications that were prescribed at discharge.  Patient states he was referred to [facility] to follow-up with medication management but due to finances he did not follow-up.  Patient reports he is currently living with his father Lance Abbott [phone].    Patient presents with garbled and slurred speech and with a very unsteady gait. Patient is tangential, rambling, and disorganized with paranoia.  Patient states that in his room that he keeps knives hidden under the bed because he is afraid there are people out to get him.  Patient reports that his mother is currently being treated with chemotherapy for cancer.  Patient made the statement that his mother's cancer is under her arm and that if he cut under his arm where the cancer is located he could get rid of his mother's cancer because he and his mother are one.   Patient states that he uses about $500 a month and crack cocaine drinks 1 to 2 pints of whiskey a day and will have several beers.  Patient stated he last drank a pint of whiskey at 5 PM today and had 4 beers and has been using crack cocaine.  Patient states that he wants to detox from alcohol and crack cocaine and then go to a rehab treatment center.  Total Time spent with patient: 45 minutes  Psychiatric Specialty Exam:  Presentation  General Appearance: Disheveled  Eye Contact:Fair  Speech:Garbled; Slurred; Slow  Speech Volume:Decreased  Handedness:Right   Mood and Affect  Mood:Labile; Depressed  Affect:Tearful   Thought Process  Thought Processes:Disorganized  Descriptions of Associations:Circumstantial  Orientation:Partial  Thought  Content:Tangential; Scattered; Rumination; Paranoid Ideation  History of Schizophrenia/Schizoaffective disorder:Yes  Duration of Psychotic Symptoms:Greater than six months  Hallucinations:Hallucinations: Auditory; Visual Description of Auditory Hallucinations: to take a knife and cut under his arm, which would cut the cancer out of his mother, bcause they are the same Description of Visual Hallucinations: pt stated he see things- would not describe when asked  Ideas of Reference:Paranoia  Suicidal Thoughts:Suicidal Thoughts: No  Homicidal Thoughts:Homicidal Thoughts: No   Sensorium  Memory:Recent Poor; Immediate Poor; Remote Poor  Judgment:Poor  Insight:Poor   Executive Functions  Concentration:Poor  Attention Span:Fair  Recall:Poor  Fund of Knowledge:Fair  Language:Fair   Psychomotor Activity  Psychomotor Activity:Psychomotor Activity: Other (comment) (gait is unsteady due to intoxication)   Assets  Assets:Desire for Improvement; Housing; Transportation; Social Support   Sleep  Sleep:Sleep: Poor Number of Hours of Sleep: -1    Physical Exam: Physical Exam HENT:     Head: Normocephalic and atraumatic.  Cardiovascular:     Pulses: Normal pulses.  Musculoskeletal:        General: Normal range of motion.     Cervical back: Normal range of motion.  Neurological:     General: No focal deficit present.     Mental Status: He is alert.  Psychiatric:        Attention and Perception: He perceives auditory and visual hallucinations.        Mood and Affect: Mood is depressed. Affect is labile.        Speech: Speech is slurred and tangential.  Behavior: Behavior is cooperative.        Thought Content: Thought content is paranoid. Thought content does not include suicidal ideation. Thought content does not include suicidal plan.        Cognition and Memory: Memory is impaired. He exhibits impaired remote memory.   Review of Systems  Constitutional:  Negative.   HENT: Negative.    Eyes: Negative.   Respiratory: Negative.    Cardiovascular: Negative.   Genitourinary: Negative.   Musculoskeletal: Negative.   Skin: Negative.   Neurological:  Positive for tremors.  Endo/Heme/Allergies: Negative.   Psychiatric/Behavioral:  Positive for depression and hallucinations. Negative for suicidal ideas.   Blood pressure 130/84, pulse 86, temperature 97.6 F (36.4 C), temperature source Oral, resp. rate 13, SpO2 98 %. There is no height or weight on file to calculate BMI.  Musculoskeletal: Strength & Muscle Tone: within normal limits Gait & Station: unsteady Patient leans: Left   Recommendations:  Based on my evaluation the patient appears to have an emergency medical condition for which I recommend the patient be transferred to the emergency department for further evaluation..  I am patient will be sent out to the ED to be medically cleared due to possible alcohol intoxication and substance use and once medically stable will be reevaluated by psychiatry for appropriate disposition.  Report was called in to Dr. Wilkie Aye at [facility] ED.  Jasper Riling, NP 04/24/2021, 11:48 PM
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
## 2021-04-25 ENCOUNTER — Encounter (HOSPITAL_COMMUNITY): Payer: Self-pay | Admitting: *Deleted

## 2021-04-25 ENCOUNTER — Other Ambulatory Visit: Payer: Self-pay

## 2021-04-25 ENCOUNTER — Emergency Department (HOSPITAL_COMMUNITY)
Admission: EM | Admit: 2021-04-25 | Discharge: 2021-04-25 | Disposition: A | Discharge status: Home / Self Care | Payer: Self-pay | Attending: Emergency Medicine | Admitting: Emergency Medicine

## 2021-04-25 LAB — COMPREHENSIVE METABOLIC PANEL
ALT: 22 U/L (ref 0–44)
AST: 29 U/L (ref 15–41)
Albumin: 4.9 g/dL (ref 3.5–5.0)
Alkaline Phosphatase: 78 U/L (ref 38–126)
Anion gap: 9 (ref 5–15)
BUN: 12 mg/dL (ref 6–20)
CO2: 26 mmol/L (ref 22–32)
Calcium: 9.7 mg/dL (ref 8.9–10.3)
Chloride: 107 mmol/L (ref 98–111)
Creatinine, Ser: 1.11 mg/dL (ref 0.61–1.24)
GFR, Estimated: >60 mL/min (ref >60)
Glucose, Bld: 91 mg/dL (ref 70–99)
Potassium: 4.3 mmol/L (ref 3.5–5.1)
Sodium: 142 mmol/L (ref 135–145)
Total Bilirubin: 0.6 mg/dL (ref 0.3–1.2)
Total Protein: 7.5 g/dL (ref 6.5–8.1)

## 2021-04-25 LAB — CBC WITH DIFFERENTIAL/PLATELET
Abs Immature Granulocytes: 0.06 10*3/uL (ref 0.00–0.07)
Basophils Absolute: 0.1 10*3/uL (ref 0.0–0.1)
Basophils Relative: 1 %
Eosinophils Absolute: 0.2 10*3/uL (ref 0.0–0.5)
Eosinophils Relative: 2 %
HCT: 47.2 % (ref 39.0–52.0)
Hemoglobin: 16.2 g/dL (ref 13.0–17.0)
Immature Granulocytes: 1 %
Lymphocytes Relative: 32 %
Lymphs Abs: 3.1 10*3/uL (ref 0.7–4.0)
MCH: 31.7 pg (ref 26.0–34.0)
MCHC: 34.3 g/dL (ref 30.0–36.0)
MCV: 92.4 fL (ref 80.0–100.0)
Monocytes Absolute: 0.6 10*3/uL (ref 0.1–1.0)
Monocytes Relative: 7 %
Neutro Abs: 5.5 10*3/uL (ref 1.7–7.7)
Neutrophils Relative %: 57 %
Platelets: 299 10*3/uL (ref 150–400)
RBC: 5.11 MIL/uL (ref 4.22–5.81)
RDW: 13.5 % (ref 11.5–15.5)
WBC: 9.5 10*3/uL (ref 4.0–10.5)
nRBC: 0 % (ref 0.0–0.2)

## 2021-04-25 LAB — RAPID URINE DRUG SCREEN, HOSP PERFORMED
Amphetamines: NOT DETECTED
Barbiturates: NOT DETECTED
Benzodiazepines: NOT DETECTED
Cocaine: POSITIVE — AB
Opiates: NOT DETECTED
Tetrahydrocannabinol: NOT DETECTED

## 2021-04-25 LAB — ETHANOL: Alcohol, Ethyl (B): 208 mg/dL — ABNORMAL HIGH (ref <10)

## 2021-04-25 MED ORDER — NICOTINE 21 MG/24HR TD PT24: 21.0000 mg | MEDICATED_PATCH | Freq: Every day | TRANSDERMAL | Status: DC

## 2021-04-25 MED ORDER — LORAZEPAM 1 MG PO TABS: 0.0000 mg | ORAL_TABLET | Freq: Two times a day (BID) | ORAL | Status: DC

## 2021-04-25 MED ORDER — THIAMINE HCL 100 MG/ML IJ SOLN: 100.0000 mg | Freq: Every day | INTRAMUSCULAR | Status: DC

## 2021-04-25 MED ORDER — LORAZEPAM 2 MG/ML IJ SOLN: 0.0000 mg | Freq: Four times a day (QID) | INTRAMUSCULAR | Status: DC

## 2021-04-25 MED ORDER — THIAMINE HCL 100 MG PO TABS: 100.0000 mg | ORAL_TABLET | Freq: Every day | ORAL | Status: DC

## 2021-04-25 MED ORDER — LORAZEPAM 2 MG/ML IJ SOLN
0.0000 mg | Freq: Two times a day (BID) | INTRAMUSCULAR | Status: DC
Start: 2021-04-27 — End: 2021-04-25

## 2021-04-25 MED ORDER — LORAZEPAM 1 MG PO TABS: 0.0000 mg | ORAL_TABLET | Freq: Four times a day (QID) | ORAL | Status: DC

## 2021-04-25 NOTE — ED Provider Notes (Addendum)
Emergency Medicine Provider Triage Evaluation Note  Lance Abbott , a 40 y.o. male  was evaluated in triage.  Pt complains of polysubstance abuse. Requesting inpatient detox from drugs and alcohol. Drank 1 pint of whiskey in the last 24 hours. Denies use of crack cocaine yesterday. States he relapsed because he could not afford his medications and wanted to combat the voices he was hearing. Endorses hx of seizures from ETOH withdrawal.  Review of Systems  Positive: Hallucinations Negative: Fever  Physical Exam  BP (!) 136/100 (BP Location: Right Arm)   Pulse 88   Temp 98 F (36.7 C) (Oral)   Resp 17   SpO2 92%  Gen:   Awake, no distress   Resp:  Normal effort  MSK:   Moves extremities without difficulty  Other:  Speech slurred  Medical Decision Making  Medically screening exam initiated at 1:18 AM.  Appropriate orders placed.  Lance Abbott was informed that the remainder of the evaluation will be completed by another provider, this initial triage assessment does not replace that evaluation, and the importance of remaining in the ED until their evaluation is complete.  Polysubstance abuse/dependence   Antony Madura, PA-C 04/25/21 0120    Antony Madura, PA-C 04/25/21 0121    Shon Baton, MD 04/26/21 [phone]

## 2021-04-25 NOTE — ED Triage Notes (Signed)
The pt reports that he wants to be detoxed from alcohol  last use was yesterday  he is also using cocaine  last time 2 days  smoked

## 2021-04-25 NOTE — ED Notes (Signed)
Vitals called x6 

## 2021-04-25 NOTE — ED Notes (Signed)
PT called X2 for vitals with no response.  °

## 2021-04-29 ENCOUNTER — Other Ambulatory Visit: Payer: Self-pay

## 2021-04-29 ENCOUNTER — Emergency Department (HOSPITAL_COMMUNITY)
Admission: EM | Admit: 2021-04-29 | Discharge: 2021-04-30 | Disposition: A | Discharge status: Other Acute Inpatient Hospital | Payer: Self-pay | Attending: Emergency Medicine | Admitting: Emergency Medicine

## 2021-04-29 ENCOUNTER — Encounter (HOSPITAL_COMMUNITY): Payer: Self-pay | Admitting: Emergency Medicine

## 2021-04-29 ENCOUNTER — Ambulatory Visit (HOSPITAL_COMMUNITY)
Admission: RE | Admit: 2021-04-29 | Discharge: 2021-04-29 | Disposition: A | Discharge status: Home / Self Care | Payer: Federal, State, Local not specified - Other | Attending: Psychiatry | Admitting: Psychiatry

## 2021-04-29 DIAGNOSIS — R45851 Suicidal ideations: Secondary | ICD-10-CM | POA: Insufficient documentation

## 2021-04-29 DIAGNOSIS — F17210 Nicotine dependence, cigarettes, uncomplicated: Secondary | ICD-10-CM | POA: Insufficient documentation

## 2021-04-29 DIAGNOSIS — Z20822 Contact with and (suspected) exposure to covid-19: Secondary | ICD-10-CM | POA: Insufficient documentation

## 2021-04-29 DIAGNOSIS — Z79899 Other long term (current) drug therapy: Secondary | ICD-10-CM | POA: Insufficient documentation

## 2021-04-29 DIAGNOSIS — F29 Unspecified psychosis not due to a substance or known physiological condition: Secondary | ICD-10-CM | POA: Insufficient documentation

## 2021-04-29 DIAGNOSIS — Y901 Blood alcohol level of 20-39 mg/100 ml: Secondary | ICD-10-CM | POA: Insufficient documentation

## 2021-04-29 DIAGNOSIS — I10 Essential (primary) hypertension: Secondary | ICD-10-CM | POA: Insufficient documentation

## 2021-04-29 DIAGNOSIS — J45909 Unspecified asthma, uncomplicated: Secondary | ICD-10-CM | POA: Insufficient documentation

## 2021-04-29 LAB — RESP PANEL BY RT-PCR (FLU A&B, COVID) ARPGX2
Influenza A by PCR: NEGATIVE
Influenza B by PCR: NEGATIVE
SARS Coronavirus 2 by RT PCR: NEGATIVE

## 2021-04-29 LAB — COMPREHENSIVE METABOLIC PANEL
ALT: 28 U/L (ref 0–44)
AST: 24 U/L (ref 15–41)
Albumin: 4.5 g/dL (ref 3.5–5.0)
Alkaline Phosphatase: 71 U/L (ref 38–126)
Anion gap: 10 (ref 5–15)
BUN: 17 mg/dL (ref 6–20)
CO2: 24 mmol/L (ref 22–32)
Calcium: 9.1 mg/dL (ref 8.9–10.3)
Chloride: 101 mmol/L (ref 98–111)
Creatinine, Ser: 1.14 mg/dL (ref 0.61–1.24)
GFR, Estimated: >60 mL/min (ref >60)
Glucose, Bld: 77 mg/dL (ref 70–99)
Potassium: 4.1 mmol/L (ref 3.5–5.1)
Sodium: 135 mmol/L (ref 135–145)
Total Bilirubin: 0.6 mg/dL (ref 0.3–1.2)
Total Protein: 7.3 g/dL (ref 6.5–8.1)

## 2021-04-29 LAB — ETHANOL: Alcohol, Ethyl (B): 33 mg/dL — ABNORMAL HIGH (ref <10)

## 2021-04-29 LAB — CBC
HCT: 43.7 % (ref 39.0–52.0)
Hemoglobin: 14.8 g/dL (ref 13.0–17.0)
MCH: 31.8 pg (ref 26.0–34.0)
MCHC: 33.9 g/dL (ref 30.0–36.0)
MCV: 94 fL (ref 80.0–100.0)
Platelets: 254 10*3/uL (ref 150–400)
RBC: 4.65 MIL/uL (ref 4.22–5.81)
RDW: 13.6 % (ref 11.5–15.5)
WBC: 8.8 10*3/uL (ref 4.0–10.5)
nRBC: 0 % (ref 0.0–0.2)

## 2021-04-29 LAB — RAPID URINE DRUG SCREEN, HOSP PERFORMED
Amphetamines: NOT DETECTED
Barbiturates: NOT DETECTED
Benzodiazepines: NOT DETECTED
Cocaine: POSITIVE — AB
Opiates: NOT DETECTED
Tetrahydrocannabinol: POSITIVE — AB

## 2021-04-29 LAB — ACETAMINOPHEN LEVEL: Acetaminophen (Tylenol), Serum: <10 ug/mL — ABNORMAL LOW (ref 10–30)

## 2021-04-29 LAB — SALICYLATE LEVEL: Salicylate Lvl: <7 mg/dL — ABNORMAL LOW (ref 7.0–30.0)

## 2021-04-29 MED ORDER — LORAZEPAM 2 MG/ML IJ SOLN
0.0000 mg | Freq: Two times a day (BID) | INTRAMUSCULAR | Status: DC
Start: 2021-05-02 — End: 2021-04-30

## 2021-04-29 MED ORDER — THIAMINE HCL 100 MG/ML IJ SOLN: 100.0000 mg | Freq: Every day | INTRAMUSCULAR | Status: DC

## 2021-04-29 MED ORDER — NICOTINE 21 MG/24HR TD PT24
21.0000 mg | MEDICATED_PATCH | Freq: Every day | TRANSDERMAL | Status: DC
  Administered 2021-04-29: 21 mg via TRANSDERMAL
  Filled 2021-04-29: qty 1

## 2021-04-29 MED ORDER — ZOLPIDEM TARTRATE 5 MG PO TABS: 5.0000 mg | ORAL_TABLET | Freq: Every evening | ORAL | Status: DC | PRN

## 2021-04-29 MED ORDER — ACETAMINOPHEN 325 MG PO TABS: 650.0000 mg | ORAL_TABLET | ORAL | Status: DC | PRN

## 2021-04-29 MED ORDER — ALUM & MAG HYDROXIDE-SIMETH 200-200-20 MG/5ML PO SUSP: 30.0000 mL | Freq: Four times a day (QID) | ORAL | Status: DC | PRN

## 2021-04-29 MED ORDER — LORAZEPAM 1 MG PO TABS
0.0000 mg | ORAL_TABLET | Freq: Four times a day (QID) | ORAL | Status: DC
  Administered 2021-04-29: 2 mg via ORAL
  Filled 2021-04-29: qty 2

## 2021-04-29 MED ORDER — THIAMINE HCL 100 MG PO TABS
100.0000 mg | ORAL_TABLET | Freq: Every day | ORAL | Status: DC
  Administered 2021-04-29: 100 mg via ORAL
  Filled 2021-04-29: qty 1

## 2021-04-29 MED ORDER — LORAZEPAM 1 MG PO TABS: 0.0000 mg | ORAL_TABLET | Freq: Two times a day (BID) | ORAL | Status: DC

## 2021-04-29 MED ORDER — LORAZEPAM 2 MG/ML IJ SOLN
0.0000 mg | Freq: Four times a day (QID) | INTRAMUSCULAR | Status: DC
Start: 2021-04-29 — End: 2021-04-30

## 2021-04-29 MED ORDER — ONDANSETRON HCL 4 MG PO TABS: 4.0000 mg | ORAL_TABLET | Freq: Three times a day (TID) | ORAL | Status: DC | PRN

## 2021-04-29 NOTE — ED Triage Notes (Signed)
Patient arrives for medical clearance after being evaluated at [facility]. Patient states that he has been off of his medications because they were $700 out of pocket and he couldn't afford them and has missed his ability shot. Patient states he also feels as if he's going into DTs.

## 2021-04-29 NOTE — H&P (Signed)
Behavioral Health Medical Screening Exam  Visit Diagnosis: Schizoaffective disorder, bipolar type (HCC), Alcohol use disorder, severe, dependence (HCC), Polysubstance abuse (HCC)  Disposition: Based on patient's current presentation, including recent 04/29/21 suicide attempt noted below and active SI with plan and psychosis, believe that the patient's schizophrenia has severely decompensated and believe that patient is a threat to himself at this time and recommend inpatient psychiatric treatment for the patient.  Patient is agreeable to inpatient psychiatric treatment.  Per Fransico MichaelKim Brooks, [facility], [facility] pending negative PCR COVID test and medical clearance. Patient has history of alcohol use disorder and reports drinking heavily (patient states that he drinks an 18 pack of beer and 1 pint of whiskey daily).  Patient states that he last drank alcohol at 2:00 AM on 04/29/2021, in which patient states he drank 2 beers at that time in order to help with his tremors secondary to alcohol withdrawal.  Patient endorses tremor on exam and mild tremor noted on exam when patient extends arms bilaterally.  Patient endorses history of alcohol withdrawal seizures, as well as history of the following alcohol withdrawal symptoms: Tremor, diaphoresis, "fever", nausea, vomiting, and diarrhea.  Patient states that he will begin to experience extreme withdrawal symptoms from alcohol in a few hours.  Due to patient's history of heavy alcohol use, alcohol withdrawal symptoms, and seizures, as well as presentation of current tremor, will transfer the patient to [facility] ED for medical clearance and PCR COVID test [facility].  [facility] psychiatric treatment once medically cleared and PCR COVID test is negative.  Provider report given to Dr. Freida BusmanAllen and Dr. Freida BusmanAllen has agreed  to accept the patient.  Patient is agreeable to being transferred to [facility] ED for medical clearance and [facility] once medically cleared. Patient is voluntary at this time.   Lance HazardBrian Abbott is a 40 y.o. male with psychiatric history significant for schizophrenia, bipolar disorder, alcohol use disorder, and cocaine abuse/polysubstance abuse, who [facility] unaccompanied as a voluntary walk-in for SI with plan and AVH.  Patient states that earlier today on 04/29/2021, he attempted suicide by cutting his left wrist multiple times in the bathroom of his father's home.  Patient states that the knife he used to cut his left wrist was too dull and did not cut deep enough to kill himself.  Patient states that he thinks his father saw him go into the bathroom with the knife the patient states that patient's father kicked down the bathroom door and came into the bathroom.  Upon patient's father entering the bathroom, patient states that patient's father told the patient to stop what he was doing and told the patient that he needed to go get some help for his mental health.  Patient states that [facility] and [facility] and left.  Patient endorses active SI currently on exam with plan to kill himself by cutting his wrist.  Aside from today's 04/29/2021 suicide attempt mentioned above, patient endorses history of 2 additional past suicide attempts, which include overdosing on 90 aspirin tablets in 2015 and cutting his wrist in 2010.  Prior to today, patient states he has not engaged in any cutting since 2015.  Patient states that he engaged in self-injurious behavior via cutting from 2012-2015  in the past and he states that his intention on cutting himself at that time was to "tried to relieve mental pain by causing physical pain".  Patient endorses history of intentionally burning himself with cigarettes "when I get depressed" in the past, and  he states that he last intentionally burned himself 1 year ago.    Patient denies HI.  Patient endorses chronic history of AVH for multiple years.  Patient endorses auditory hallucinations currently on exam.  Patient reports that his auditory hallucinations are multiple voices of people that he does not know but he thinks are the voices of angels and demons.  Patient states that the angel voices will say positive things such as "you're better than that.  Don't listen to them".  Patient states that the demon voices he hears will say negative things to him such as "you're worthless" and will also tell him to do negative things such as harm himself and kill himself.  Patient also states that his mother has cancer in her armpit and that the demon voices have told him recently to "cut out a piece of my armpit so she'll be alright".  Patient states that the demon voices are currently telling him to "leave this place and go get drunk".  Patient states that his auditory hallucinations originate from inside and outside of his head.  Patient denies visual hallucinations currently on exam, but does endorse having visual hallucinations earlier in the exam room.  Patient describes his visual hallucinations as "seeing angels and demons".  Patient states that earlier in the exam room, the walls of the exam room were "making ugly faces and winking at me and talking to me".  Patient also endorses delusions of reference, stating that when he is watching TV, the program he is watching on TV will be "pertaining to me".  Patient also states that he sees "ghosts jumped into the TV" at home and states that recently he saw a crock pot fly off of his kitchen table.  In reference to patient's reported auditory hallucinations, patient states "if I get into them, I would be dead already". Patient denies feelings of paranoia.   Patient reports poor sleep, stating that he either "sleeps too much or sleeps too little".  Patient states that  some nights he will not sleep at all, but he denies sleepless nights due to feeling of lack of need for sleep.  Patient endorses intermittent hypersomnia in which she states that sometimes he will sleep from 12:00 AM to 4:00 PM.  Patient denies anhedonia, but does endorse chronic feelings of guilt, hopelessness, and worthlessness.  Patient endorses fatigue, but denies changes in concentration or focus.  Patient endorses increased appetite and 20 pound weight gain over the past 2 to 3 months.  Patient endorses being easily distracted but denies any risky sexual behaviors for excessive shopping sprees/spending.  Patient denies any thoughts of grandiosity.  Patient does endorse occasional flight of ideas.  Patient states that he is not currently in a manic state, but that he has experienced episodes of mania within the fairly recent past.  Per chart review, patient presented [facility] as a walk-in, was assessed by TTS and [facility] provider and inpatient psychiatric treatment was recommended for the patient at that time.  Chart review shows that patient was then transferred to [facility] ED for medical clearance to be completed prior to patient being admitted to [facility].  However, on exam, patient states that he left the emergency  department AMA.  Per chart review, patient has been admitted to [facility] multiple times in the past and was most recently admitted to [facility] from 02/18/2021 to 02/24/2021 and was discharged on Abilify 15 mg p.o. daily for mood control, BuSpar 15 mg p.o. 3 times daily for anxiety/MDD, Vistaril 50 mg p.o. at bedtime for anxiety/insomnia, and trazodone 50 mg p.o. at bedtime as needed for sleep.  Upon discharge from [facility] on 02/24/2021, patient was also instructed to receive an Abilify 400 mg Srer IM injection every 28 days, beginning on Mar 24, 2021 for mood control.  Patient states that he thinks he had an Abilify injection while he was admitted to [facility] in April 2022, but he states that he  has not received an Abilify injection since then.  Patient also states that he had about a 2-week supply of the prescriptions mentioned above provided to him upon his discharge from behavioral health Hospital in April 2020 and that once he ran out of his 2-week supply of these prescriptions, he was only able to afford to refill the BuSpar, Vistaril and trazodone prescription as mentioned above.  Patient states that he currently only has 3 50 mg trazodone tablets left and does not have many 50 mg Vistaril tablets left at home.  Patient states that he has been taking the Vistaril 50 mg p.o. at bedtime and trazodone 50 mg as needed at bedtime as prescribed.  Patient states that he has been taking his BuSpar 15 mg p.o. 3 times daily as prescribed, but he states that he is running low on this as well because his dad has been taking some of these tablets.  Patient states he was unable to refill his Abilify 15 mg p.o. prescription after he ran out of his 2-week supply that he was prescribed upon behavioral health hospital discharge in April 2022.  Patient states that he has a friend with schizophrenia who takes Seroquel 100 mg and that for the past week, he has been taking one of his friends 100 mg Seroquel tablets p.o. nightly for the past week to help him sleep.  Patient denies any adverse effects to taking Seroquel over this past week. Patient denies any additional psychiatric hospitalizations since his April 2022 behavioral health hospital admission mentioned above.  Patient states that he never followed up with an outpatient psychiatrist upon discharge from behavioral health Hospital back in April 2022.  Patient states he is not currently seeing a therapist either.  Patient lives in [place] with his father and stepmother.  Patient denies any access to guns or weapons.  Patient reports drinking "an 18 pack of beer and 1 pint of whiskey" daily.  Patient states that he last consumed alcohol at 2 AM on 04/29/2021, in  which patient states that he drank 2 beers at that time to relieve some withdrawal tremors that he was experiencing at that time. Patient endorses tremor on exam and mild tremor noted on exam when patient extends arms bilaterally.  Patient endorses history of alcohol withdrawal seizures, as well as history of the following alcohol withdrawal symptoms: Tremor, diaphoresis, "fever", nausea, vomiting, and diarrhea.  Patient states that the only withdrawal symptoms of the ones mentioned above he is currently experiencing is tremor, but he states that he will begin to experience extreme withdrawal symptoms from alcohol in a few hours.  Patient states that he has had 1 seizure in the past which patient states occurred earlier this year in 2022 and was secondary to alcohol  withdrawal, the patient is unable to recall which month/day the seizure occurred.  Patient reports smoking 2 packs/day of cigarettes since the age of 49.  Patient also endorses using/smoking crack cocaine, in which he states he last used "not much, about $40 worth" of crack cocaine on 04/27/21. Patient states that for the past year, he has been using $40 - $300 worth of crack cocaine twice per week.  Patient also endorses smoking 0.5-1 joint/less than 1 g of marijuana once weekly since the age of 80.  Patient also states that he used to use heroin via IV route, but patient states that he stopped using heroin 4 years ago after his wife died of a heroin overdose 4 years ago.  Patient denies any additional substance use.  Total Time spent with patient: 30 minutes  Psychiatric Specialty Exam:  Presentation  General Appearance: Disheveled; Appropriate for Environment  Eye Contact:Good  Speech:Clear and Coherent; Normal Rate  Speech Volume:Normal  Handedness:Right   Mood and Affect  Mood:Depressed  Affect:Congruent   Thought Process  Thought Processes:Coherent; Goal Directed  Descriptions of Associations:Intact  Orientation:Full  (Time, Place and Person)  Thought Content:Delusions  History of Schizophrenia/Schizoaffective disorder:Yes  Duration of Psychotic Symptoms:Greater than six months  Hallucinations:Hallucinations: Auditory; Visual Description of Auditory Hallucinations: See HPI for details. Description of Visual Hallucinations: See HPI for details.  Ideas of Reference:Delusions  Suicidal Thoughts:Suicidal Thoughts: Yes, Active (See HPI for details.) SI Active Intent and/or Plan: With Intent; With Plan; With Means to Carry Out; With Access to Means  Homicidal Thoughts:Homicidal Thoughts: No   Sensorium  Memory:Immediate Fair; Recent Fair; Remote Fair  Judgment:Fair  Insight:Fair   Executive Functions  Concentration:Fair  Attention Span:Fair  Recall:Fair  Fund of Knowledge:Fair  Language:Good   Psychomotor Activity  Psychomotor Activity:Psychomotor Activity: Normal   Assets  Assets:Communication Skills; Desire for Improvement; Housing; Leisure Time; Physical Health; Resilience; Social Support   Sleep  Sleep:Sleep: Poor    Physical Exam: Physical Exam Vitals reviewed.  Constitutional:      General: He is not in acute distress.    Appearance: He is not ill-appearing, toxic-appearing or diaphoretic.  HENT:     Head: Normocephalic and atraumatic.     Right Ear: External ear normal.     Left Ear: External ear normal.     Nose: Nose normal.  Eyes:     General:        Right eye: No discharge.        Left eye: No discharge.     Conjunctiva/sclera: Conjunctivae normal.  Cardiovascular:     Rate and Rhythm: Normal rate.  Pulmonary:     Effort: Pulmonary effort is normal. No respiratory distress.  Musculoskeletal:        General: Normal range of motion.     Cervical back: Normal range of motion.  Neurological:     General: No focal deficit present.     Mental Status: He is alert and oriented to person, place, and time.     Comments: Mild tremor noted of patient's upper  extremities upon bilateral arm extension.   Psychiatric:        Attention and Perception: He perceives auditory and visual hallucinations.        Mood and Affect: Mood is depressed.        Speech: Speech normal.        Behavior: Behavior is not agitated, slowed, aggressive, withdrawn, hyperactive or combative. Behavior is cooperative.        Thought  Content: Thought content is delusional. Thought content is not paranoid. Thought content includes suicidal ideation. Thought content does not include homicidal ideation. Thought content includes suicidal plan.     Comments: Affect is non-congruent with mood and is flat and restricted.    Review of Systems  Constitutional:  Positive for malaise/fatigue. Negative for chills, diaphoresis, fever and weight loss.       Positive for weight gain.  HENT:  Negative for congestion.   Respiratory:  Negative for cough and shortness of breath.   Cardiovascular:  Negative for chest pain and palpitations.  Gastrointestinal:  Negative for abdominal pain, constipation, diarrhea, nausea and vomiting.  Musculoskeletal:  Positive for back pain. Negative for joint pain and myalgias.  Neurological:  Positive for tremors and seizures. Negative for dizziness and headaches.  Psychiatric/Behavioral:  Positive for depression, hallucinations, substance abuse and suicidal ideas. Negative for memory loss. The patient has insomnia. The patient is not nervous/anxious.   All other systems reviewed and are negative. Patient endorses history of alcohol withdrawal seizures, as well as history of the following alcohol withdrawal symptoms: Tremor, diaphoresis, "fever", nausea, vomiting, and diarrhea.  Patient states that the only withdrawal symptoms of the ones mentioned above he is currently experiencing is tremor.   Vitals: Blood pressure 107/77, pulse 81, temperature 98.5 F (36.9 C), temperature source Oral, resp. rate 18, SpO2 99 %. There is no height or weight on file to  calculate BMI.  Musculoskeletal: Strength & Muscle Tone: within normal limits Gait & Station: normal Patient leans: N/A   Recommendations:  Based on my evaluation, the patient appears to have an urgent medical condition. Based on patient's current presentation, including recent 04/29/21 suicide attempt noted below and active SI with plan and psychosis, believe that the patient's schizophrenia has severely decompensated and believe that patient is a threat to himself at this time and recommend inpatient psychiatric treatment for the patient.  Patient is agreeable to inpatient psychiatric treatment.  Per Fransico Michael, [facility] patient is accepted to [facility] for inpatient psychiatric treatment pending negative PCR COVID test and medical clearance. Patient has history of alcohol use disorder and reports drinking heavily (patient states that he drinks an 18 pack of beer and 1 pint of whiskey daily).  Patient states that he last drank alcohol at 2:00 AM on 04/29/2021, in which patient states he drank 2 beers at that time in order to help with his tremors secondary to alcohol withdrawal.  Patient endorses tremor on exam and mild tremor noted on exam when patient extends arms bilaterally.  Patient endorses history of alcohol withdrawal seizures, as well as history of the following alcohol withdrawal symptoms: Tremor, diaphoresis, "fever", nausea, vomiting, and diarrhea.  Patient states that he will begin to experience extreme withdrawal symptoms from alcohol in a few hours.  Due to patient's history of heavy alcohol use, alcohol withdrawal symptoms, and seizures, as well as presentation of current tremor, will transfer the patient to [facility] ED for medical clearance and PCR COVID test prior to returning to [facility] to begin his inpatient psychiatric treatment stay at [facility].  Patient may return to [facility] to begin inpatient psychiatric treatment once medically cleared and PCR COVID test is negative.  Provider report given to  Dr. Freida Busman and Dr. Freida Busman has agreed to accept the patient.  Patient is agreeable to being transferred to [facility] ED for medical clearance and to begin inpatient psychiatric treatment at [facility] once medically cleared. Patient is voluntary at this time.  Jaclyn Shaggy, PA-C 04/29/2021, 8:09 PM

## 2021-04-29 NOTE — ED Provider Notes (Signed)
Emergency Medicine Provider Triage Evaluation Note  Lance Abbott , a 40 y.o. male  was evaluated in triage.  Pt complains of medical clearance.  Review of Systems  Positive: SI, psychosis, alcohol use, tremor, sweaty, n/v/d Negative: HI  Physical Exam  BP 122/86   Pulse 69   Temp 98.6 F (37 C) (Oral)   Resp 16   SpO2 98%  Gen:   Awake, no distress   Resp:  Normal effort  MSK:   Moves extremities without difficulty  Other:  Mild tremors  Medical Decision Making  Medically screening exam initiated at 9:02 PM.  Appropriate orders placed.  Lance Abbott was informed that the remainder of the evaluation will be completed by another provider, this initial triage assessment does not replace that evaluation, and the importance of remaining in the ED until their evaluation is complete.  Patient with significant history of alcohol abuse as well as schizophrenia sent here from [facility] for medical clearance as patient reports suicidal ideation with plan, having hallucination as well as concerns for alcohol withdrawal.  Admits that he drinks heavily and consumed heavy tobacco use.  He is currently voluntary   Fayrene Helper, PA-C 04/29/21 2115    Horton, Clabe Seal, DO 04/29/21 2221
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
## 2021-04-30 ENCOUNTER — Inpatient Hospital Stay (HOSPITAL_COMMUNITY)
Admission: AD | Admit: 2021-04-30 | Discharge: 2021-05-04 | DRG: 885 | Disposition: A | Discharge status: Home / Self Care | Payer: No Typology Code available for payment source | Source: Intra-hospital | Attending: Emergency Medicine | Admitting: Emergency Medicine

## 2021-04-30 ENCOUNTER — Encounter (HOSPITAL_COMMUNITY): Payer: Self-pay | Admitting: Psychiatry

## 2021-04-30 DIAGNOSIS — Z20822 Contact with and (suspected) exposure to covid-19: Secondary | ICD-10-CM | POA: Diagnosis present

## 2021-04-30 DIAGNOSIS — F1410 Cocaine abuse, uncomplicated: Secondary | ICD-10-CM | POA: Diagnosis present

## 2021-04-30 DIAGNOSIS — F10239 Alcohol dependence with withdrawal, unspecified: Secondary | ICD-10-CM | POA: Diagnosis present

## 2021-04-30 DIAGNOSIS — Y901 Blood alcohol level of 20-39 mg/100 ml: Secondary | ICD-10-CM | POA: Diagnosis present

## 2021-04-30 DIAGNOSIS — R45851 Suicidal ideations: Secondary | ICD-10-CM | POA: Diagnosis present

## 2021-04-30 DIAGNOSIS — Z818 Family history of other mental and behavioral disorders: Secondary | ICD-10-CM | POA: Diagnosis not present

## 2021-04-30 DIAGNOSIS — E785 Hyperlipidemia, unspecified: Secondary | ICD-10-CM | POA: Diagnosis present

## 2021-04-30 DIAGNOSIS — G4700 Insomnia, unspecified: Secondary | ICD-10-CM | POA: Diagnosis present

## 2021-04-30 DIAGNOSIS — F410 Panic disorder [episodic paroxysmal anxiety] without agoraphobia: Secondary | ICD-10-CM | POA: Diagnosis present

## 2021-04-30 DIAGNOSIS — I10 Essential (primary) hypertension: Secondary | ICD-10-CM | POA: Diagnosis present

## 2021-04-30 DIAGNOSIS — F1020 Alcohol dependence, uncomplicated: Secondary | ICD-10-CM | POA: Diagnosis present

## 2021-04-30 DIAGNOSIS — F17210 Nicotine dependence, cigarettes, uncomplicated: Secondary | ICD-10-CM | POA: Diagnosis present

## 2021-04-30 DIAGNOSIS — F1910 Other psychoactive substance abuse, uncomplicated: Secondary | ICD-10-CM | POA: Diagnosis present

## 2021-04-30 DIAGNOSIS — F250 Schizoaffective disorder, bipolar type: Secondary | ICD-10-CM | POA: Diagnosis present

## 2021-04-30 LAB — LIPID PANEL
Cholesterol: 236 mg/dL — ABNORMAL HIGH (ref 0–200)
HDL: 63 mg/dL (ref >40)
LDL Cholesterol: 105 mg/dL — ABNORMAL HIGH (ref 0–99)
Total CHOL/HDL Ratio: 3.7 RATIO
Triglycerides: 339 mg/dL — ABNORMAL HIGH (ref <150)
VLDL: 68 mg/dL — ABNORMAL HIGH (ref 0–40)

## 2021-04-30 MED ORDER — ACETAMINOPHEN 325 MG PO TABS
650.0000 mg | ORAL_TABLET | Freq: Four times a day (QID) | ORAL | Status: DC | PRN
  Administered 2021-04-30 – 2021-05-04 (×7): 650 mg via ORAL
  Filled 2021-04-30 (×7): qty 2

## 2021-04-30 MED ORDER — HYDROXYZINE HCL 25 MG PO TABS
25.0000 mg | ORAL_TABLET | Freq: Once | ORAL | Status: AC
  Administered 2021-04-30: 25 mg via ORAL
  Filled 2021-04-30 (×2): qty 1

## 2021-04-30 MED ORDER — HYDROXYZINE HCL 25 MG PO TABS
25.0000 mg | ORAL_TABLET | Freq: Four times a day (QID) | ORAL | Status: AC | PRN
  Administered 2021-05-02: 25 mg via ORAL
  Filled 2021-04-30: qty 1

## 2021-04-30 MED ORDER — FOLIC ACID 1 MG PO TABS
1.0000 mg | ORAL_TABLET | Freq: Every day | ORAL | Status: DC
  Administered 2021-04-30 – 2021-05-04 (×5): 1 mg via ORAL
  Filled 2021-04-30 (×7): qty 1

## 2021-04-30 MED ORDER — ARIPIPRAZOLE ER 400 MG IM SRER
400.0000 mg | INTRAMUSCULAR | Status: DC
  Administered 2021-04-30: 400 mg via INTRAMUSCULAR

## 2021-04-30 MED ORDER — LORAZEPAM 1 MG PO TABS
1.0000 mg | ORAL_TABLET | Freq: Four times a day (QID) | ORAL | Status: AC
  Administered 2021-04-30 – 2021-05-01 (×4): 1 mg via ORAL
  Filled 2021-04-30 (×4): qty 1

## 2021-04-30 MED ORDER — LORAZEPAM 1 MG PO TABS
1.0000 mg | ORAL_TABLET | Freq: Two times a day (BID) | ORAL | Status: AC
  Administered 2021-05-02 – 2021-05-03 (×2): 1 mg via ORAL
  Filled 2021-04-30 (×2): qty 1

## 2021-04-30 MED ORDER — GABAPENTIN 100 MG PO CAPS
100.0000 mg | ORAL_CAPSULE | Freq: Three times a day (TID) | ORAL | Status: DC
  Administered 2021-04-30 – 2021-05-04 (×13): 100 mg via ORAL
  Filled 2021-04-30 (×4): qty 1
  Filled 2021-04-30: qty 21
  Filled 2021-04-30 (×3): qty 1
  Filled 2021-04-30 (×2): qty 21
  Filled 2021-04-30 (×9): qty 1

## 2021-04-30 MED ORDER — NICOTINE 21 MG/24HR TD PT24
21.0000 mg | MEDICATED_PATCH | Freq: Every day | TRANSDERMAL | Status: DC
  Administered 2021-04-30 – 2021-05-03 (×3): 21 mg via TRANSDERMAL
  Filled 2021-04-30 (×6): qty 1

## 2021-04-30 MED ORDER — NICOTINE 21 MG/24HR TD PT24: 21.0000 mg | MEDICATED_PATCH | Freq: Every day | TRANSDERMAL | Status: DC

## 2021-04-30 MED ORDER — HYDROXYZINE HCL 50 MG PO TABS
50.0000 mg | ORAL_TABLET | Freq: Every day | ORAL | Status: DC
  Filled 2021-04-30: qty 1

## 2021-04-30 MED ORDER — LOPERAMIDE HCL 2 MG PO CAPS
2.0000 mg | ORAL_CAPSULE | ORAL | Status: AC | PRN
  Administered 2021-04-30 (×3): 2 mg via ORAL
  Filled 2021-04-30 (×3): qty 1

## 2021-04-30 MED ORDER — ONDANSETRON 4 MG PO TBDP
4.0000 mg | ORAL_TABLET | Freq: Four times a day (QID) | ORAL | Status: AC | PRN
  Administered 2021-04-30: 4 mg via ORAL
  Filled 2021-04-30: qty 1

## 2021-04-30 MED ORDER — ADULT MULTIVITAMIN W/MINERALS CH
1.0000 | ORAL_TABLET | Freq: Every day | ORAL | Status: DC
  Administered 2021-05-01 – 2021-05-04 (×3): 1 via ORAL
  Filled 2021-04-30 (×7): qty 1

## 2021-04-30 MED ORDER — ARIPIPRAZOLE 15 MG PO TABS
15.0000 mg | ORAL_TABLET | Freq: Every day | ORAL | Status: DC
  Administered 2021-04-30 – 2021-05-04 (×5): 15 mg via ORAL
  Filled 2021-04-30 (×5): qty 1
  Filled 2021-04-30: qty 7
  Filled 2021-04-30 (×2): qty 1

## 2021-04-30 MED ORDER — LORAZEPAM 1 MG PO TABS
1.0000 mg | ORAL_TABLET | Freq: Three times a day (TID) | ORAL | Status: AC
  Administered 2021-05-01 – 2021-05-02 (×3): 1 mg via ORAL
  Filled 2021-04-30 (×3): qty 1

## 2021-04-30 MED ORDER — LORAZEPAM 1 MG PO TABS
1.0000 mg | ORAL_TABLET | Freq: Every day | ORAL | Status: AC
  Administered 2021-05-04: 1 mg via ORAL
  Filled 2021-04-30: qty 1

## 2021-04-30 MED ORDER — MAGNESIUM HYDROXIDE 400 MG/5ML PO SUSP: 30.0000 mL | Freq: Every day | ORAL | Status: DC | PRN

## 2021-04-30 MED ORDER — LORAZEPAM 1 MG PO TABS
1.0000 mg | ORAL_TABLET | Freq: Four times a day (QID) | ORAL | Status: DC | PRN
  Administered 2021-04-30: 1 mg via ORAL
  Filled 2021-04-30: qty 1

## 2021-04-30 MED ORDER — AMLODIPINE BESYLATE 10 MG PO TABS
10.0000 mg | ORAL_TABLET | Freq: Every day | ORAL | Status: DC
  Administered 2021-04-30 – 2021-05-04 (×5): 10 mg via ORAL
  Filled 2021-04-30 (×2): qty 1
  Filled 2021-04-30: qty 7
  Filled 2021-04-30 (×4): qty 1

## 2021-04-30 MED ORDER — ALUM & MAG HYDROXIDE-SIMETH 200-200-20 MG/5ML PO SUSP: 30.0000 mL | ORAL | Status: DC | PRN

## 2021-04-30 MED ORDER — TRAZODONE HCL 100 MG PO TABS
100.0000 mg | ORAL_TABLET | Freq: Every evening | ORAL | Status: DC | PRN
  Administered 2021-04-30 – 2021-05-03 (×4): 100 mg via ORAL
  Filled 2021-04-30 (×4): qty 1
  Filled 2021-04-30: qty 7

## 2021-04-30 MED ORDER — BUSPIRONE HCL 15 MG PO TABS
15.0000 mg | ORAL_TABLET | Freq: Three times a day (TID) | ORAL | Status: DC
  Administered 2021-04-30 – 2021-05-04 (×14): 15 mg via ORAL
  Filled 2021-04-30: qty 21
  Filled 2021-04-30 (×6): qty 1
  Filled 2021-04-30: qty 21
  Filled 2021-04-30: qty 1
  Filled 2021-04-30: qty 21
  Filled 2021-04-30 (×10): qty 1

## 2021-04-30 MED ORDER — TRAZODONE HCL 50 MG PO TABS: 50.0000 mg | ORAL_TABLET | Freq: Every evening | ORAL | Status: DC | PRN

## 2021-04-30 MED ORDER — THIAMINE HCL 100 MG PO TABS
100.0000 mg | ORAL_TABLET | Freq: Every day | ORAL | Status: DC
  Administered 2021-04-30 – 2021-05-04 (×5): 100 mg via ORAL
  Filled 2021-04-30 (×7): qty 1

## 2021-04-30 NOTE — Progress Notes (Signed)
Recreation Therapy Notes  Date:  6.24.22 Time: 0930 Location: [address] Dayroom   Group Topic: Stress Management   Goal Area(s) Addresses: Patient will identify positive stress management techniques. Patient will identify benefits of using stress management post d/c.   Intervention: Stress Management    Activity: Meditation.  LRT play a meditation for patients to calm and relax the body.     Education:  Stress Management, Discharge Planning.   Education Outcome: Acknowledges Education   Clinical Observations/Feedback:  LRT unable to do group due to fire drill and goals group running over time.     Caroll Rancher, LRT/CTRS         Caroll Rancher A 04/30/2021 10:54 AM

## 2021-04-30 NOTE — ED Notes (Signed)
Safe transport here for patient  

## 2021-04-30 NOTE — Tx Team (Signed)
Initial Treatment Plan 04/30/2021 3:27 AM [name] [id number]    PATIENT STRESSORS: Financial difficulties Health problems Marital or family conflict Medication change or noncompliance Substance abuse Traumatic event   PATIENT STRENGTHS: Active sense of humor Supportive family/friends   PATIENT IDENTIFIED PROBLEMS: Substance use  Anxiety  "Detox"  "Get rid of voices"               DISCHARGE CRITERIA:  Ability to meet basic life and health needs Improved stabilization in mood, thinking, and/or behavior Medical problems require only outpatient monitoring Motivation to continue treatment in a less acute level of care Reduction of life-threatening or endangering symptoms to within safe limits  PRELIMINARY DISCHARGE PLAN: Attend aftercare/continuing care group Attend PHP/IOP Attend 12-step recovery group Outpatient therapy Return to previous living arrangement  PATIENT/FAMILY INVOLVEMENT: This treatment plan has been presented to and reviewed with the patient, Lance Abbott, and/or family member.  The patient and family have been given the opportunity to ask questions and make suggestions.  Bethann Punches, RN 04/30/2021, 3:27 AM
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
## 2021-04-30 NOTE — Progress Notes (Signed)
Pt pleasant and cooperative this shift.  Pt received his Abilify Maintena this afternoon.  He is contracting for safety and has attended groups today.    04/30/21 0800  Psych Admission Type (Psych Patients Only)  Admission Status Voluntary  Psychosocial Assessment  Patient Complaints Anxiety;Sleep disturbance  Eye Contact Fair  Facial Expression Anxious  Affect Depressed;Anxious  Speech Logical/coherent  Interaction Assertive  Motor Activity Hand-wringing  Appearance/Hygiene Unremarkable;Disheveled  Behavior Characteristics Appropriate to situation;Cooperative  Mood Depressed;Anxious  Thought Process  Coherency WDL  Content WDL  Delusions None reported or observed  Perception WDL  Hallucination Auditory;Visual  Judgment Poor  Confusion None  Danger to Self  Current suicidal ideation? Passive  Self-Injurious Behavior No self-injurious ideation or behavior indicators observed or expressed   Agreement Not to Harm Self Yes  Description of Agreement Verbal  Danger to Others  Danger to Others None reported or observed      COVID-19 Daily Checkoff  Have you had a fever (temp > 37.80C/100F)  in the past 24 hours?  No  If you have had runny nose, nasal congestion, sneezing in the past 24 hours, has it worsened? No  COVID-19 EXPOSURE  Have you traveled outside the state in the past 14 days? No  Have you been in contact with someone with a confirmed diagnosis of COVID-19 or PUI in the past 14 days without wearing appropriate PPE? No  Have you been living in the same home as a person with confirmed diagnosis of COVID-19 or a PUI (household contact)? No  Have you been diagnosed with COVID-19? No
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
## 2021-04-30 NOTE — Tx Team (Signed)
Interdisciplinary Treatment and Diagnostic Plan Update  04/30/2021 Time of Session:  Emrick Hensch MRN: [id number]  Principal Diagnosis: Schizoaffective disorder, bipolar type [facility])  Secondary Diagnoses: Principal Problem:   Schizoaffective disorder, bipolar type ([place]) Active Problems:   Alcohol use disorder, severe, dependence ([place])   Polysubstance abuse ([place])   Current Medications:  Current Facility-Administered Medications  Medication Dose Route Frequency Provider Last Rate Last Admin   acetaminophen (TYLENOL) tablet 650 mg  650 mg Oral Q6H PRN Prescilla Sours, PA-C   650 mg at 04/30/21 0301   alum & mag hydroxide-simeth (MAALOX/MYLANTA) 200-200-20 MG/5ML suspension 30 mL  30 mL Oral Q4H PRN Margorie John W, PA-C       amLODipine (NORVASC) tablet 10 mg  10 mg Oral Daily Margorie John W, PA-C   10 mg at 04/30/21 0827   ARIPiprazole (ABILIFY) tablet 15 mg  15 mg Oral Daily Margorie John W, PA-C   15 mg at 04/30/21 0827   busPIRone (BUSPAR) tablet 15 mg  15 mg Oral TID Prescilla Sours, PA-C   15 mg at 69/67/89 3810   folic acid (FOLVITE) tablet 1 mg  1 mg Oral Daily Margorie John W, PA-C   1 mg at 04/30/21 0827   hydrOXYzine (ATARAX/VISTARIL) tablet 50 mg  50 mg Oral QHS Margorie John W, PA-C       loperamide (IMODIUM) capsule 2-4 mg  2-4 mg Oral PRN Prescilla Sours, PA-C   2 mg at 04/30/21 0836   LORazepam (ATIVAN) tablet 1 mg  1 mg Oral Q6H PRN Prescilla Sours, PA-C   1 mg at 04/30/21 0836   magnesium hydroxide (MILK OF MAGNESIA) suspension 30 mL  30 mL Oral Daily PRN Prescilla Sours, PA-C       multivitamin with minerals tablet 1 tablet  1 tablet Oral Daily Lovena Le, Cody W, PA-C       nicotine (NICODERM CQ - dosed in mg/24 hours) patch 21 mg  21 mg Transdermal Daily Margorie John W, PA-C   21 mg at 04/30/21 0823   ondansetron (ZOFRAN-ODT) disintegrating tablet 4 mg  4 mg Oral Q6H PRN Margorie John W, PA-C       thiamine tablet 100 mg  100 mg Oral Daily Margorie John W, PA-C   100 mg at 04/30/21 1751    traZODone (DESYREL) tablet 50 mg  50 mg Oral QHS PRN Prescilla Sours, PA-C       PTA Medications: Medications Prior to Admission  Medication Sig Dispense Refill Last Dose   amLODipine (NORVASC) 10 MG tablet Take 1 tablet (10 mg total) by mouth daily. For high blood pressure 30 tablet 0    ARIPiprazole (ABILIFY) 15 MG tablet Take 1 tablet (15 mg total) by mouth daily. For mood control 11 tablet 0    ARIPiprazole ER (ABILIFY MAINTENA) 400 MG SRER injection Inject 2 mLs (400 mg total) into the muscle every 28 (twenty-eight) days. (Due on 03-24-21): For mood control 1 each 0    busPIRone (BUSPAR) 15 MG tablet Take 1 tablet (15 mg total) by mouth 3 (three) times daily. For anxiety 90 tablet 0    folic acid (FOLVITE) 1 MG tablet Take 1 tablet (1 mg total) by mouth daily. For folate replacement 30 tablet 0    hydrOXYzine (ATARAX/VISTARIL) 50 MG tablet Take 1 tablet (50 mg total) by mouth at bedtime. For anxiety 75 tablet 0    Multiple Vitamin (MULTIVITAMIN WITH MINERALS) TABS tablet Take 1 tablet by mouth  daily.      nicotine (NICODERM CQ - DOSED IN MG/24 HOURS) 21 mg/24hr patch Place 1 patch (21 mg total) onto the skin daily. (May buy from over the counter): For smoking cessation 28 patch 0    thiamine 100 MG tablet Take 1 tablet (100 mg total) by mouth daily. For thiamine replacement 30 tablet 0    traZODone (DESYREL) 50 MG tablet Take 1 tablet (50 mg total) by mouth at bedtime as needed for sleep. 30 tablet 0     Patient Stressors: Financial difficulties Health problems Marital or family conflict Medication change or noncompliance Substance abuse Traumatic event  Patient Strengths: Active sense of humor Supportive family/friends  Treatment Modalities: Medication Management, Group therapy, Case management,  1 to 1 session with clinician, Psychoeducation, Recreational therapy.   Physician Treatment Plan for Primary Diagnosis: Schizoaffective disorder, bipolar type ([place]) Long Term Goal(s):  Improvement in symptoms so as ready for discharge   Short Term Goals: Ability to identify changes in lifestyle to reduce recurrence of condition will improve Ability to verbalize feelings will improve Ability to disclose and discuss suicidal ideas Ability to demonstrate self-control will improve Ability to identify and develop effective coping behaviors will improve Compliance with prescribed medications will improve Ability to identify triggers associated with substance abuse/mental health issues will improve  Medication Management: Evaluate patient's response, side effects, and tolerance of medication regimen.  Therapeutic Interventions: 1 to 1 sessions, Unit Group sessions and Medication administration.  Evaluation of Outcomes: Not Met  Physician Treatment Plan for Secondary Diagnosis: Principal Problem:   Schizoaffective disorder, bipolar type ([place]) Active Problems:   Alcohol use disorder, severe, dependence ([place])   Polysubstance abuse ([place])  Long Term Goal(s): Improvement in symptoms so as ready for discharge   Short Term Goals: Ability to identify changes in lifestyle to reduce recurrence of condition will improve Ability to verbalize feelings will improve Ability to disclose and discuss suicidal ideas Ability to demonstrate self-control will improve Ability to identify and develop effective coping behaviors will improve Compliance with prescribed medications will improve Ability to identify triggers associated with substance abuse/mental health issues will improve     Medication Management: Evaluate patient's response, side effects, and tolerance of medication regimen.  Therapeutic Interventions: 1 to 1 sessions, Unit Group sessions and Medication administration.  Evaluation of Outcomes: Not Met   RN Treatment Plan for Primary Diagnosis: Schizoaffective disorder, bipolar type ([place]) Long Term Goal(s): Knowledge of disease and therapeutic regimen to maintain health will  improve  Short Term Goals: Ability to participate in decision making will improve, Ability to identify and develop effective coping behaviors will improve, and Compliance with prescribed medications will improve  Medication Management: RN will administer medications as ordered by provider, will assess and evaluate patient's response and provide education to patient for prescribed medication. RN will report any adverse and/or side effects to prescribing provider.  Therapeutic Interventions: 1 on 1 counseling sessions, Psychoeducation, Medication administration, Evaluate responses to treatment, Monitor vital signs and CBGs as ordered, Perform/monitor CIWA, COWS, AIMS and Fall Risk screenings as ordered, Perform wound care treatments as ordered.  Evaluation of Outcomes: Not Met   LCSW Treatment Plan for Primary Diagnosis: Schizoaffective disorder, bipolar type ([place]) Long Term Goal(s): Safe transition to appropriate next level of care at discharge, Engage patient in therapeutic group addressing interpersonal concerns.  Short Term Goals: Engage patient in aftercare planning with referrals and resources, Increase ability to appropriately verbalize feelings, and Increase emotional regulation  Therapeutic Interventions: Assess for  all discharge needs, 1 to 1 time with Education officer, museum, Explore available resources and support systems, Assess for adequacy in community support network, Educate family and significant other(s) on suicide prevention, Complete Psychosocial Assessment, Interpersonal group therapy.  Evaluation of Outcomes: Not Met   Progress in Treatment: Attending groups: No. and As evidenced by:  was recently admitted Participating in groups: No. and As evidenced by:  was recently admitted Taking medication as prescribed: Yes. Toleration medication: Yes. Family/Significant other contact made: No, will contact:  will obtain consent Patient understands diagnosis: Yes. Discussing patient  identified problems/goals with staff: No. Medical problems stabilized or resolved: Yes. Denies suicidal/homicidal ideation: Yes. Issues/concerns per patient self-inventory: No. Other: None  New problem(s) identified: No, Describe:  None  New Short Term/Long Term Goal(s):medication stabilization, elimination of SI thoughts, development of comprehensive mental wellness plan.   Patient Goals:  Did Not Attend  Discharge Plan or Barriers: Patient recently admitted. CSW will continue to follow and assess for appropriate referrals and possible discharge planning.   Reason for Continuation of Hospitalization: Depression Medication stabilization Suicidal ideation  Estimated Length of Stay: 3-5 days  Attendees: Patient: 04/30/2021 10:53 AM  Physician:  04/30/2021 10:53 AM  Nursing:  04/30/2021 10:53 AM  RN Care Manager: 04/30/2021 10:53 AM  Social Worker: Toney Reil, Latanya Presser 04/30/2021 10:53 AM  Recreational Therapist:  04/30/2021 10:53 AM  Other:  04/30/2021 10:53 AM  Other:  04/30/2021 10:53 AM  Other: 04/30/2021 10:53 AM    Scribe for Treatment Team: Mliss Fritz, [place] 04/30/2021 10:53 AM

## 2021-04-30 NOTE — Progress Notes (Signed)
Adult Psychoeducational Group Note  Date:  04/30/2021 Time:  8:47 PM  Group Topic/Focus:  Wrap-Up Group:   The focus of this group is to help patients review their daily goal of treatment and discuss progress on daily workbooks.  Participation Level:  Did Not Attend   Additional Comments:  pt did not attend  Raylene Miyamoto 04/30/2021, 8:47 PM

## 2021-04-30 NOTE — H&P (Signed)
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 Processes:Coherent; Goal Directed  Duration of Psychotic Symptoms: Greater than six months  Past Diagnosis of Schizophrenia or Psychoactive disorder: Yes  Descriptions of Associations:Intact  Orientation:Full (Time, Place and Person)  Thought Content:Delusions  Hallucinations:Hallucinations: Auditory; Visual Description of Auditory Hallucinations: See HPI for details. Description of Visual Hallucinations: See HPI for details.  Ideas of Reference:Delusions  Suicidal Thoughts:Suicidal Thoughts: Yes, Active SI Active Intent and/or Plan: With Intent; With Plan; With Means to Carry Out; With Access to Means  Homicidal Thoughts:Homicidal Thoughts: No   Sensorium  Memory:Immediate Fair; Recent Fair; Remote Fair  Judgment:Fair  Insight:Fair   Executive Functions  Concentration:Fair  Attention Span:Fair  Recall:Fair  Fund of Knowledge:Fair  Language:Good   Psychomotor Activity  Psychomotor Activity:Psychomotor Activity: Normal   Assets  Assets:Communication Skills; Desire for Improvement; Housing; Leisure Time; Physical Health; Resilience; Social Support   Sleep  Sleep:Sleep: Poor    Physical Exam: Physical Exam Vitals reviewed.  Constitutional:      General: He is not in acute distress.    Appearance: He is not ill-appearing, toxic-appearing or diaphoretic.  HENT:     Head: Normocephalic and atraumatic.     Right Ear:  External ear normal.     Left Ear: External ear normal.     Nose: Nose normal.     Mouth/Throat:     Comments: Patient is missing multiple front teeth. Eyes:     General:        Right eye: No discharge.        Left eye: No discharge.     Conjunctiva/sclera: Conjunctivae normal.  Cardiovascular:     Rate and Rhythm: Normal rate.  Pulmonary:     Effort: Pulmonary effort is normal. No respiratory distress.  Musculoskeletal:        General: Normal range of motion.     Cervical back: Normal range of motion.  Neurological:     General: No focal deficit present.     Mental Status: He is alert and oriented to person, place, and time.  Psychiatric:        Attention and Perception: He perceives auditory and visual hallucinations.        Mood and Affect: Mood is depressed.        Speech: Speech normal.        Behavior: Behavior normal. Behavior is not agitated, slowed, aggressive, withdrawn, hyperactive or combative. Behavior is cooperative.        Thought Content: Thought content is delusional. Thought content is not paranoid. Thought content includes suicidal ideation. Thought content does not include homicidal ideation. Thought content includes suicidal plan.     Comments: Affect is non-mood-congruent, flat and restricted.   Review of Systems  Constitutional:  Positive for malaise/fatigue. Negative for chills, diaphoresis, fever and weight loss.  Positive for weight gain.   HENT:  Negative for congestion.   Respiratory:  Negative for cough and shortness of breath.   Cardiovascular:  Negative for chest pain and palpitations.  Gastrointestinal:  Negative for abdominal pain, constipation, diarrhea, nausea and vomiting.  Musculoskeletal:  Positive for back pain. Negative for joint pain and myalgias.  Neurological:  Positive for tremors and seizures. Negative for dizziness and headaches.  Psychiatric/Behavioral:  Positive for depression, hallucinations, substance abuse and suicidal  ideas. Negative for memory loss. The patient has insomnia. The patient is not nervous/anxious.   All other systems reviewed and are negative. Patient endorses history of alcohol withdrawal seizures, as well as history of the following alcohol withdrawal symptoms: Tremor, diaphoresis, "fever", nausea, vomiting, and diarrhea. Patient denies any of these withdrawal symptoms currently on exam.   Vitals: Blood pressure (!) 112/102, pulse 97, temperature 98.3 F (36.8 C), temperature source Oral, resp. rate 18, height  (1.651 m), weight 95.7 kg, SpO2 100 %. Body mass index is 35.11 kg/m.  Treatment Plan Summary: Admit patient to [facility] adult unit for inpatient psychiatric treatment for crisis stabilization and further evaluation and treatment of patient's current condition. Daily contact with patient to assess and evaluate symptoms and progress in treatment and Medication management and therapy Patient will be integrated into the milieu and engage in group therapy/group sessions as well as medication management.  Current medications ordered:  -Tylenol 650 mg p.o. every 6 hours as needed for mild pain  -Maalox/Mylanta 200-200-20 milligrams/5 mL suspension 30 mL p.o. every 4 hours as needed for indigestion  -Milk of Magnesia suspension 30 mL p.o. daily as needed for mild constipation  -Will restart the following home medications (medications that patient was discharged on from patient's previous 02/2021 [facility] admission):    -Amlodipine 10 mg p.o. daily for hypertension   -Abilify 15 mg p.o. daily for mood control   -BuSpar 15 mg p.o. 3 times daily for anxiety/MDD symptoms   -Folic acid tablet 1 mg p.o. daily for anemia from inadequate folic acid/folic acid supplementation   -Vistaril 50 mg p.o. daily at bedtime for anxiety/insomnia   -Nicotine patch 21 mg x 24 hours for nicotine cravings/addiction   -Trazodone 50 mg p.o. at bedtime as needed for sleep Per patient's 02/24/2021 [id number] discharge summary,  patient was supposed to begin Aripiprazole ER 400 mg Srer injection on 03/24/2021 and get this injection every 28 days.  Patient states he has not had this injection since being discharged from [facility] in April 2022.  Will defer to day shift treatment team to discuss LAI treatment with the patient upon daytime reassessment.  Per [facility], patient received Ativan 2 mg p.o. and thiamine 100 mg p.o. at 2155 on 04/29/2021 in the emergency department.  For alcohol withdrawal symptoms, will initiate CIWA protocol with the following:   -Imodium capsule 2 to 4 mg p.o. as needed for diarrhea/loose stools   -Ativan 1 mg p.o. every 6 hours as needed for CIWA greater than 10   -Multivitamin with minerals tablet: 1 tablet p.o. daily for vitamin and mineral supplementation   -Zofran ODT 4 mg p.o. every 6 hours as needed for nausea/vomiting   -Thiamine tablet 100 mg p.o. daily for thiamine supplementation  Observation Level/Precautions:  15 minute checks  Laboratory:   Labs/tests reviewed: -04/29/2021 PCR COVID: Negative -04/29/2021 UDS: Positive for cocaine and THC -04/29/2021 Tylenol level: Within normal limits -04/29/2021 salicylate level: Within normal limits -04/29/2021 ethanol level: Slightly elevated at 33 mg/dL (reference range  less than 10 mg/dL) -1/61/0960 CBC: Within normal limits -04/29/2021 CMP: Within normal limits -02/18/2021 hemoglobin A1c: 5.3%/within normal limits (reference range 4.8 to 5.6%), no hemoglobin A1c recheck needed at this time -02/18/2021 TSH: 1.764 uIU/mL/within normal limits: No TSH recheck needed at this time -02/21/2021 lipid panel: Total cholesterol elevated at 227 mg/dL, triglycerides elevated at 395 mg/dL, VLDL elevated at 79 mg/dL -Due to elevated lipid panel values noted above, lipid panel reordered on 04/30/2021, which shows elevated total cholesterol at 236 mg/dL, elevated triglycerides at 339 mg/dL, elevated VLDL at 68 mg/dL, and elevated LDL cholesterol at 105 mg/dL -4/54/0981 EKG: No  acute/concerning findings or signs of ischemia with ventricular rate of 99 bpm, PR interval 142 ms, QRS 74 ms, QT/QTC 350/449 ms -Repeat EKG ordered to be done on 04/30/2021 to check patient's QTC due to patient's history of taking antipsychotic medication.  Psychotherapy: Group therapy  Medications: See treatment plan summary above/MAR  Consultations: TBD (none at this time)  Discharge Concerns: SI/SI with plan, patient's ability to contract for safety, patient's access to medications, patient's medication compliance/ability to afford medications, AVH, psychosis, alcohol withdrawal symptoms  Estimated LOS: 3 to 5 days (patient's estimated LOS may change depending on patient's progress with treatment during this inpatient stay)  Other:     Physician Treatment Plan for Primary Diagnosis: Schizoaffective disorder, bipolar type (HCC) Long Term Goal(s): Improvement in symptoms so as ready for discharge  Short Term Goals: Ability to identify changes in lifestyle to reduce recurrence of condition will improve, Ability to verbalize feelings will improve, Ability to disclose and discuss suicidal ideas, Ability to demonstrate self-control will improve, Ability to identify and develop effective coping behaviors will improve, Compliance with prescribed medications will improve, and Ability to identify triggers associated with substance abuse/mental health issues will improve  Physician Treatment Plan for Secondary Diagnosis: Principal Problem:   Schizoaffective disorder, bipolar type (HCC) Active Problems:   Alcohol use disorder, severe, dependence (HCC)   Polysubstance abuse (HCC)  Schizoaffective disorder, bipolar type (HCC)  Long Term Goal(s): Improvement in symptoms so as ready for discharge  Short Term Goals: Ability to identify changes in lifestyle to reduce recurrence of condition will improve, Ability to verbalize feelings will improve, Ability to disclose and discuss suicidal ideas, Ability to  demonstrate self-control will improve, Ability to identify and develop effective coping behaviors will improve, Compliance with prescribed medications will improve, and Ability to identify triggers associated with substance abuse/mental health issues will improve  I certify that inpatient services furnished can reasonably be expected to improve the patient's condition.    Please see psychiatrist's suicide risk assessment (SRA) for further details regarding patient's treatment plan.  Jaclyn Shaggy, PA-C 6/24/20223:58 AM

## 2021-04-30 NOTE — [facility] Group Notes (Signed)
Type of Therapy and Topic:  Group Therapy:  Positive Affirmations   Participation Level:  Active  Description of Group: This group addressed positive affirmation toward self and others. Patients went around the room and identified two positive things about themselves and two positive things about a peer in the room. Patients reflected on how it felt to share something positive with others, to identify positive things about themselves, and to hear positive things from others. Patients were encouraged to have a daily reflection of positive characteristics or circumstances. Therapeutic Goals Patient will verbalize two of their positive qualities Patient will demonstrate empathy for others by stating two positive qualities about a peer in the group Patient will verbalize their feelings when voicing positive self affirmations and when voicing positive affirmations of others Patients will discuss the potential positive impact on their wellness/recovery of focusing on positive traits of self and others. Summary of Patient Progress: Trayvion attended the group but came in late.  He participated in the discussion with their peers. Jimmylee did not share anything about himself or a positive affirmation.     Therapeutic Modalities Cognitive Behavioral Therapy Motivational Interviewing

## 2021-04-30 NOTE — Progress Notes (Signed)
Lance Abbott is a 40 y.o. male voluntarily admitted for suicide ideation with a plan to cut wrist with a knife. Pt has a previous attempt by cutting. Pt stated hs been stressed out due to his mom being diagnosed with stage 4 cancer, and having had an urtication with his step mom. Pt stated he abuses alcohol cocaine and marijuana, pt stated his wife died of overdose on drugs,he  has been in jail as a juvenile for 6 years.  Pt has been cooperative with the and mission process, reports passive SI AVH, but denies HI and verbally contracted for safety. Consents signed, skin/belongings search completed and pt oriented to unit. Pt stable at this time. Pt given the opportunity to express concerns and ask questions. Pt given toiletries. Will continue to monitor.

## 2021-05-01 LAB — TSH: TSH: 1.268 u[IU]/mL (ref 0.350–4.500)

## 2021-05-01 MED ORDER — ATORVASTATIN CALCIUM 20 MG PO TABS
20.0000 mg | ORAL_TABLET | Freq: Every day | ORAL | Status: DC
  Administered 2021-05-01 – 2021-05-04 (×4): 20 mg via ORAL
  Filled 2021-05-01: qty 7
  Filled 2021-05-01 (×5): qty 1

## 2021-05-01 MED ORDER — LORAZEPAM 1 MG PO TABS
1.0000 mg | ORAL_TABLET | Freq: Once | ORAL | Status: AC
  Administered 2021-05-01: 1 mg via ORAL
  Filled 2021-05-01: qty 1
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
## 2021-05-01 NOTE — Progress Notes (Signed)
Patient did not attend group, although she was made aware of it.  

## 2021-05-01 NOTE — Progress Notes (Signed)
Patient states that he had a good talk with parents and family today. He also stated that he slept well last evening. His goal for tomorrow is to stay positive and try to better himself.

## 2021-05-01 NOTE — [facility] Group Notes (Signed)
LCSW Group Therapy Note  05/01/2021   10:00-11:00am   Type of Therapy and Topic:  Group Therapy: Anger Cues and Responses  Participation Level:  Active   Description of Group:   In this group, patients learned how to recognize the physical, cognitive, emotional, and behavioral responses they have to anger-provoking situations.  They identified a recent time they became angry and how they reacted.  They analyzed how their reaction was possibly beneficial and how it was possibly unhelpful.  The group discussed a variety of healthier coping skills that could help with such a situation in the future.  Focus was placed on how helpful it is to recognize the underlying emotions to our anger, because working on those can lead to a more permanent solution as well as our ability to focus on the important rather than the urgent.  Therapeutic Goals: Patients will remember their last incident of anger and how they felt emotionally and physically, what their thoughts were at the time, and how they behaved. Patients will identify how their behavior at that time worked for them, as well as how it worked against them. Patients will explore possible new behaviors to use in future anger situations. Patients will learn that anger itself is normal and cannot be eliminated, and that healthier reactions can assist with resolving conflict rather than worsening situations.  Summary of Patient Progress:  The patient was provided with the following information:  That anger is a natural part of human life.  That people can acquire effective coping skills and work toward having positive outcomes.  The patient now understands that there emotional and physical cues associated with anger and that these can be used as warning signs alert them to step-back, regroup and use a coping skill.  Patient was encouraged to work on managing anger more effectively.  Therapeutic Modalities:   Cognitive Behavioral Therapy  Geraldyn Shain D  Betzaida Cremeens   

## 2021-05-02 NOTE — [facility] Group Notes (Signed)
Patient did not attend morning goals group, although she was made aware of it.  

## 2021-05-02 NOTE — Progress Notes (Signed)
D- Patient alert and oriented. Patient affect/mood reported as depression 5 and anxiety 8. Denies SI, HI, AVH, and pain. Patient Goal: " try to be happy".   A- Scheduled medications administered to patient, per MD orders. Support and encouragement provided.  Routine safety checks conducted every 15 minutes.  Patient informed to notify staff with problems or concerns.  R- No adverse drug reactions noted. Patient contracts for safety at this time. Patient compliant with medications and treatment plan. Patient receptive, calm, and cooperative. Patient interacts well with others on the unit.  Patient remains safe at this time.             [place] NOVEL CORONAVIRUS (COVID-19) DAILY CHECK-OFF SYMPTOMS - answer yes or no to each - every day NO YES  Have you had a fever in the past 24 hours?  Fever (Temp > 37.80C / 100F) X    Have you had any of these symptoms in the past 24 hours? New Cough  Sore Throat   Shortness of Breath  Difficulty Breathing  Unexplained Body Aches   X    Have you had any one of these symptoms in the past 24 hours not related to allergies?   Runny Nose  Nasal Congestion  Sneezing   X    If you have had runny nose, nasal congestion, sneezing in the past 24 hours, has it worsened?   X    EXPOSURES - check yes or no X    Have you traveled outside the state in the past 14 days?   X    Have you been in contact with someone with a confirmed diagnosis of COVID-19 or PUI in the past 14 days without wearing appropriate PPE?   X    Have you been living in the same home as a person with confirmed diagnosis of COVID-19 or a PUI (household contact)?     X    Have you been diagnosed with COVID-19?     X                                                                                                                             What to do next: Answered NO to all: Answered YES to anything:    Proceed with unit schedule Follow the [facility] Inpatient Flowsheet.

## 2021-05-02 NOTE — Progress Notes (Signed)
   05/01/21 2100  Psych Admission Type (Psych Patients Only)  Admission Status Voluntary  Psychosocial Assessment  Patient Complaints None  Eye Contact Fair  Facial Expression Anxious  Affect Depressed;Anxious  Speech Logical/coherent  Interaction Assertive  Motor Activity Fidgety  Appearance/Hygiene Unremarkable  Behavior Characteristics Cooperative;Appropriate to situation  Mood Pleasant  Aggressive Behavior  Targets Self;Property;Family  Type of Behavior Striking out;Threatening  Effect No apparent injury  Thought Process  Coherency WDL  Content WDL  Delusions None reported or observed  Perception WDL  Hallucination Auditory;Visual  Judgment Poor  Confusion None  Danger to Self  Current suicidal ideation? Denies  Self-Injurious Behavior No self-injurious ideation or behavior indicators observed or expressed   Agreement Not to Harm Self Yes  Description of Agreement Verbal  Danger to Others  Danger to Others None reported or observed

## 2021-05-02 NOTE — [facility] Group Notes (Signed)
[facility] Group Notes:  (Nursing/MHT/Case Management/Adjunct)  Date:  05/02/2021  Time:  5:23 PM  Type of Therapy:  Relaxation therapy  Participation Level:  Did Not Attend  Participation Quality:    Affect:    Cognitive:    Insight:    Engagement in Group:    Modes of Intervention:  Activity  Summary of Progress/Problems:  Did not attend despite staff invitation.    Christina Waldrop V Virgie Kunda 05/02/2021, 5:23 PM 

## 2021-05-02 NOTE — [facility] Group Notes (Signed)
[facility] LCSW Group Therapy Note  Date/Time:  05/02/2021 9:00-10:00 or 10:00-11:00AM  Type of Therapy and Topic:  Group Therapy:  Healthy and Unhealthy Supports  Participation Level:  Minimal   Description of Group:  Patients in this group were introduced to the idea of adding a variety of healthy supports to address the various needs in their lives.Patients discussed what additional healthy supports could be helpful in their recovery and wellness after discharge in order to prevent future hospitalizations.   An emphasis was placed on using counselor, doctor, therapy groups, 12-step groups, and problem-specific support groups to expand supports.  They also worked as a group on developing a specific plan for several patients to deal with unhealthy supports through boundary-setting, psychoeducation with loved ones, and even termination of relationships.   Therapeutic Goals:   1)  discuss importance of adding supports to stay well once out of the hospital  2)  compare healthy versus unhealthy supports and identify some examples of each  3)  generate ideas and descriptions of healthy supports that can be added  4)  offer mutual support about how to address unhealthy supports  5)  encourage active participation in and adherence to discharge plan    Summary of Patient Progress:  The patient stated that current healthy supports in his life are  his family while current unhealthy supports include drug using associates .  The patient expressed a willingness to add long term residential treatment as support(s) to help in his  recovery journey.   Therapeutic Modalities:   Motivational Interviewing Brief Solution-Focused Therapy  Evorn Gong

## 2021-05-02 NOTE — [facility] Group Notes (Signed)
[facility] Group Notes:  (Nursing/MHT/Case Management/Adjunct)   Date:  05/02/2021  Time:  4:06 PM   Type of Therapy:  Nurse Education   Participation Level:  Did Not Attend   Participation Quality:               Affect:     Cognitive:     Insight:     Engagement in Group:     Modes of Intervention:     Summary of Progress/Problems:  Did not attend group despite staff invitation.   Lance Abbott 
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
## 2021-05-02 NOTE — Plan of Care (Signed)
  Problem: Education: Goal: Emotional status will improve Outcome: Progressing Goal: Mental status will improve Outcome: Progressing   

## 2021-05-03 DIAGNOSIS — F1410 Cocaine abuse, uncomplicated: Secondary | ICD-10-CM | POA: Diagnosis present

## 2021-05-03 LAB — HEMOGLOBIN A1C
Hgb A1c MFr Bld: 5 % (ref 4.8–5.6)
Mean Plasma Glucose: 97 mg/dL

## 2021-05-03 MED ORDER — NICOTINE 21 MG/24HR TD PT24
21.0000 mg | MEDICATED_PATCH | Freq: Every day | TRANSDERMAL | Status: DC
  Administered 2021-05-04: 21 mg via TRANSDERMAL
  Filled 2021-05-03 (×3): qty 1

## 2021-05-03 NOTE — [facility] Group Notes (Signed)
Type of Therapy and Topic:  Group Therapy - Healthy vs Unhealthy Coping Skills  Participation Level:  Active   Description of Group The focus of this group was to determine what unhealthy coping techniques typically are used by group members and what healthy coping techniques would be helpful in coping with various problems. Patients were guided in becoming aware of the differences between healthy and unhealthy coping techniques. Patients were asked to identify 2-3 healthy coping skills they would like to learn to use more effectively.  Therapeutic Goals Patients learned that coping is what human beings do all day long to deal with various situations in their lives Patients defined and discussed healthy vs unhealthy coping techniques Patients identified their preferred coping techniques and identified whether these were healthy or unhealthy Patients determined 2-3 healthy coping skills they would like to become more familiar with and use more often. Patients provided support and ideas to each other   Summary of Patient Progress:  During group, patient's discussed both healthy and unhealthy coping skills while also participating in outside activities with their peers. Patient proved open to input from peers and feedback from CSW. Patient demonstrated insight into the subject matter, was respectful of peers, and participated throughout the entire session.   Therapeutic Modalities Cognitive Behavioral Therapy Motivational Interviewing 

## 2021-05-03 NOTE — [facility] Counselor (Signed)
CSW spoke with Lance Abbott who states that he is interested in having a referral sent to [facility] and is open to attending [facility] at [facility] for outpatient substance use until [facility] has an available bed.  CSW will send these referrals and add them to his follow up.

## 2021-05-03 NOTE — [facility] Group Notes (Signed)
Occupational Therapy Group Note Date: 05/03/2021 Group Topic/Focus: Stress Management  Group Description: Group encouraged increased participation and engagement through discussion focused on topic of stress management. Patients engaged interactively to discuss components of stress including physical signs, emotional signs, negative management strategies, and positive management strategies. Each individual identified one new stress management strategy they would like to try moving forward.    Therapeutic Goals: Identify current stressors Identify healthy vs unhealthy stress management strategies/techniques Discuss and identify physical and emotional signs of stress Participation Level: Active   Participation Quality: Independent   Behavior: Calm, Cooperative, and Interactive   Speech/Thought Process: Focused   Affect/Mood: Full range   Insight: Moderate   Judgement: Moderate   Individualization: Lance Abbott was active in their participation of group discussion/activity. Pt identified current stressors "my house, family dynamics and becoming frustrated when I find a crack pipe while I clean up the living room." Appeared receptive to support and feedback from peers.  Modes of Intervention: Activity, Discussion, Education, and Support  Patient Response to Interventions:  Attentive, Engaged, and Receptive   Plan: Continue to engage patient in OT groups 2 - 3x/week.  05/03/2021  Donne Hazel, MOT, OTR/L

## 2021-05-03 NOTE — Progress Notes (Signed)
The focus of this group is to help patients review their daily goal of treatment and discuss progress on daily workbooks. Pt attended the evening group session and responded to all discussion prompts from the Writer. Pt shared that today was a good day on the unit, the highlight of which was playing basketball with his peers in the courtyard. "I actually won our game!"  Pt also shared that his goal for this week was to discharge home. Pt discussed the risks of not discharging straight to another facility, but vowed that he would do his best to remain sober until he was accepted into one to continue his treatment. Pt also shared other barriers to staying sober/well including medication access and outpatient treatment, which are things he is actively working on.  Pt rated his day an 8 out of 10 and his affect was appropriate.

## 2021-05-03 NOTE — [facility] Suicide Risk Assessment (Signed)
[facility] INPATIENT:  Family/Significant Other Suicide Prevention Education  Suicide Prevention Education:  Education Completed; Lance Abbott [phone] (Father)  has been identified by the patient as the family member/significant other with whom the patient will be residing, and identified as the person(s) who will aid the patient in the event of a mental health crisis (suicidal ideations/suicide attempt).  With written consent from the patient, the family member/significant other has been provided the following suicide prevention education, prior to the and/or following the discharge of the patient.  The suicide prevention education provided includes the following: Suicide risk factors Suicide prevention and interventions National Suicide Hotline telephone number [facility] assessment telephone number [facility] Emergency Assistance 911 [facility] and/or Residential Mobile Crisis Unit telephone number  Request made of family/significant other to: Remove weapons (e.g., guns, rifles, knives), all items previously/currently identified as safety concern.   Remove drugs/medications (over-the-counter, prescriptions, illicit drugs), all items previously/currently identified as a safety concern.  The family member/significant other verbalizes understanding of the suicide prevention education information provided.  The family member/significant other agrees to remove the items of safety concern listed above.  CSW spoke with Lance Abbott who states that his son has been experiencing bizarre behaviors and talking about things that did not happen or were not real.  Lance Abbott states that he would like his son to get help for his substance use issues by going to a treatment center or by getting outpatient services.  Lance Abbott states that there are no firearms or weapons in the home and that his son can come back to the home at discharge.  CSW completed SPE with Lance Abbott.   Lance Abbott  Lance Abbott 05/03/2021, 3:02 PM

## 2021-05-03 NOTE — Progress Notes (Signed)
Patient has been up in the dayroom interacting appropriately with peers. He was reminded about his ativan taper and was fine with taking vistaril. He attended group and was compliant with his medications. Support given and safety maintained with 15 min checks.

## 2021-05-03 NOTE — Progress Notes (Signed)
Pt did not attend goals group. 

## 2021-05-03 NOTE — Progress Notes (Signed)
Recreation Therapy Notes  Date:  6.27.22 Time: 0930  Location: [address] Hall Dayroom  Group Topic: Stress Management  Goal Area(s) Addresses:  Patient will identify positive stress management techniques. Patient will identify benefits of using stress management post d/c.  Intervention: Stress Management  Activity : Meditation.  LRT played a meditation that focused on being resilient when faced with adversity.     Education:  Stress Management, Discharge Planning.   Education Outcome: Acknowledges Education  Clinical Observations/Feedback: Pt did not attend group.    Caroll Rancher, LRT/CTRS         Caroll Rancher A 05/03/2021 12:44 PM
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
## 2021-05-03 NOTE — Progress Notes (Signed)
   05/03/21 1100  Psych Admission Type (Psych Patients Only)  Admission Status Voluntary  Psychosocial Assessment  Patient Complaints None  Eye Contact Fair  Facial Expression Anxious  Affect Depressed;Anxious  Speech Logical/coherent  Interaction Assertive  Motor Activity Fidgety  Appearance/Hygiene Unremarkable  Behavior Characteristics Cooperative  Mood Pleasant  Aggressive Behavior  Targets Self;Property;Family  Type of Behavior Striking out;Threatening  Effect No apparent injury  Thought Process  Coherency WDL  Content WDL  Delusions None reported or observed  Perception WDL  Hallucination Auditory;Visual  Judgment Poor  Confusion None  Danger to Self  Current suicidal ideation? Denies  Self-Injurious Behavior No self-injurious ideation or behavior indicators observed or expressed   Agreement Not to Harm Self Yes  Description of Agreement Verbal  Danger to Others  Danger to Others None reported or observed

## 2021-05-03 NOTE — BHH Group Notes (Addendum)
ADULT GRIEF GROUP NOTE:   Spiritual care group on grief and loss facilitated by chaplain Dyanne Carrel, [facility]   Group Goal:   Support / Education around grief and loss   Members engage in facilitated group support and psycho-social education.   Group Description:   Following introductions and group rules, group members engaged in facilitated group dialog and support around topic of loss, with particular support around experiences of loss in their lives. Group Identified types of loss (relationships / self / things) and identified patterns, circumstances, and changes that precipitate losses. Reflected on thoughts / feelings around loss, normalized grief responses, and recognized variety in grief experience. Group noted Worden's four tasks of grief in discussion.   Group drew on Adlerian / Rogerian, narrative, MI,   Patient Progress: Lance Abbott was engaged in conversation and shared about this loss of his wife by overdose.  He was an active participant and was an attentive and supportive listener.  Chaplain Dyanne Carrel, Bcc Pager, [phone] 1:18 PM

## 2021-05-04 MED ORDER — TRAZODONE HCL 100 MG PO TABS: 100.0000 mg | ORAL_TABLET | Freq: Every evening | ORAL | 0 refills | Status: DC | PRN

## 2021-05-04 MED ORDER — GABAPENTIN 100 MG PO CAPS: 100.0000 mg | ORAL_CAPSULE | Freq: Three times a day (TID) | ORAL | 0 refills | Status: DC

## 2021-05-04 MED ORDER — ATORVASTATIN CALCIUM 20 MG PO TABS: 20.0000 mg | ORAL_TABLET | Freq: Every day | ORAL | 0 refills | Status: DC

## 2021-05-04 MED ORDER — AMLODIPINE BESYLATE 10 MG PO TABS: 10.0000 mg | ORAL_TABLET | Freq: Every day | ORAL | 0 refills | Status: DC

## 2021-05-04 MED ORDER — ARIPIPRAZOLE ER 400 MG IM SRER: 400.0000 mg | INTRAMUSCULAR | 0 refills | Status: DC

## 2021-05-04 MED ORDER — BUSPIRONE HCL 15 MG PO TABS: 15.0000 mg | ORAL_TABLET | Freq: Three times a day (TID) | ORAL | 0 refills | Status: DC

## 2021-05-04 MED ORDER — ARIPIPRAZOLE 15 MG PO TABS: 15.0000 mg | ORAL_TABLET | Freq: Every day | ORAL | 0 refills | Status: DC

## 2021-05-04 NOTE — BHH Group Notes (Signed)
BHH Group Notes:  (Nursing/MHT/Case Management/Adjunct)  Date:  05/04/2021  Time:  10:36 AM  Type of Therapy:   Orientation group  Participation Level:  Active  Participation Quality:  Appropriate and Attentive  Affect:  Excited  Cognitive:  Alert and Appropriate  Insight:  Appropriate and Improving  Engagement in Group:  Engaged  Modes of Intervention:  Discussion and Education  Summary of Progress/Problems:  He reported that he slept great with medications.  His goal for today is "get out and work on my aftercare."  Levin Bacon 05/04/2021, 10:36 AM

## 2021-05-04 NOTE — BHH Suicide Risk Assessment (Signed)
[facility] Discharge Suicide Risk Assessment   Principal Problem: Schizoaffective disorder, bipolar type (HCC) Discharge Diagnoses: Principal Problem:   Schizoaffective disorder, bipolar type (HCC) Active Problems:   Alcohol use disorder, severe, dependence (HCC)   Cocaine abuse (HCC)   Total Time spent with patient: 15 minutes  Musculoskeletal: Strength & Muscle Tone: within normal limits Gait & Station: normal Patient leans: N/A  Psychiatric Specialty Exam  Presentation  General Appearance: Appropriate for Environment  Eye Contact:Fair  Speech:Normal Rate  Speech Volume:Normal  Handedness:Right   Mood and Affect  Mood:Euthymic  Duration of Depression Symptoms: Greater than two weeks  Affect:Flat   Thought Process  Thought Processes:Coherent  Descriptions of Associations:Intact  Orientation:Full (Time, Place and Person)  Thought Content:Logical  History of Schizophrenia/Schizoaffective disorder:Yes  Duration of Psychotic Symptoms:Greater than six months  Hallucinations:Hallucinations: None  Ideas of Reference:None  Suicidal Thoughts:Suicidal Thoughts: No  Homicidal Thoughts:Homicidal Thoughts: No   Sensorium  Memory:Immediate Fair; Recent Fair; Remote Fair  Judgment:Intact  Insight:Fair   Executive Functions  Concentration:Fair  Attention Span:Fair  Recall:Fair  Fund of Knowledge:Fair  Language:Fair   Psychomotor Activity  Psychomotor Activity:Psychomotor Activity: Decreased   Assets  Assets:Desire for Improvement; Resilience; Housing; Social Support   Sleep  Sleep:Sleep: Good Number of Hours of Sleep: 6.75   Physical Exam: Physical Exam Vitals and nursing note reviewed.  Constitutional:      Appearance: Normal appearance.  HENT:     Head: Normocephalic and atraumatic.  Pulmonary:     Effort: Pulmonary effort is normal.  Neurological:     General: No focal deficit present.     Mental Status: He is alert and oriented  to person, place, and time.   Review of Systems  All other systems reviewed and are negative. Blood pressure 132/89, pulse 93, temperature 98 F (36.7 C), temperature source Oral, resp. rate 18, height 5\' 5"  (1.651 m), weight 95.7 kg, SpO2 100 %. Body mass index is 35.11 kg/m.  Mental Status Per Nursing Assessment::   On Admission:  Suicidal ideation indicated by patient  Demographic Factors:  Male, Caucasian, Low socioeconomic status, and Unemployed  Loss Factors: NA  Historical Factors: Impulsivity  Risk Reduction Factors:   Living with another person, especially a relative and Positive social support  Continued Clinical Symptoms:  Schizophrenia:   Less than 20 years old  Cognitive Features That Contribute To Risk:  None    Suicide Risk:  Minimal: No identifiable suicidal ideation.  Patients presenting with no risk factors but with morbid ruminations; may be classified as minimal risk based on the severity of the depressive symptoms   Follow-up Information     Services, [facility]. Go on 05/12/2021.   Why: You have a hospital follow up appointment for therapy and medication management services on 05/12/21 at 9:00 am.  This appointment will be held in person.  Please inquire about substance abuse intensive outpatient services. Contact information: [address] [place] [phone]         [facility] Follow up.   Specialty: Addiction Medicine Why: Referral made Contact information: [address] [place] [address] [place] [phone]                 Plan Of Care/Follow-up recommendations:  Activity:  ad lib  [phone], MD 05/04/2021, 9:27 AM

## 2021-05-04 NOTE — Progress Notes (Signed)
  [facility] Adult Case Management Discharge Plan :  Will you be returning to the same living situation after discharge:  Yes,  Home  At discharge, do you have transportation home?: Yes,  Safe Transport  Do you have the ability to pay for your medications: Yes,  Family and community   Release of information consent forms completed and in the chart;  Patient's signature needed at discharge.  Patient to Follow up at:  Follow-up Information     [facility], [facility]. Go on 05/12/2021.   Why: You have a hospital follow up appointment for therapy and medication management services on 05/12/21 at 9:00 am.  This appointment will be held in person.  Please inquire about substance abuse intensive outpatient services. Contact information: [address] [place] [address] [phone]         [facility] Follow up.   Specialty: Addiction Medicine Why: Referral made Contact information: [address] [place] [address] [phone]                 Next level of care provider has access to [facility] Link:no  Safety Planning and Suicide Prevention discussed: Yes,  with patient and father   Have you used any form of tobacco in the last 30 days? (Cigarettes, Smokeless Tobacco, Cigars, and/or Pipes): Yes  Has patient been referred to the Quitline?: Patient refused referral  Patient has been referred for addiction treatment: Yes  Aram Beecham, LCSWA 05/04/2021, 9:44 AM

## 2021-05-04 NOTE — Progress Notes (Signed)
D: Pt A & O X 3. Denies SI, HI, AVH and pain at this time. D/C home as ordered. Picked up in front of facility by [employer]. A: D/C instructions reviewed with pt including prescriptions, medication samples and follow up appointment, compliance encouraged. All belongings from locker 26 given to pt at time of departure. Scheduled medications given with verbal education and effects monitored. Safety checks maintained without incident till time of d/c.  R: Pt receptive to care. Compliant with medications when offered. Denies adverse drug reactions when assessed. Verbalized understanding related to d/c instructions. Signed belonging sheet in agreement with items received from locker. Ambulatory with a steady gait. Appears to be in no physical distress at time of departure.
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
## 2021-05-25 ENCOUNTER — Emergency Department (HOSPITAL_COMMUNITY)
Admission: EM | Admit: 2021-05-25 | Discharge: 2021-05-26 | Disposition: A | Discharge status: Home / Self Care | Payer: Self-pay | Attending: Emergency Medicine | Admitting: Emergency Medicine

## 2021-05-25 ENCOUNTER — Other Ambulatory Visit: Payer: Self-pay

## 2021-05-25 ENCOUNTER — Encounter (HOSPITAL_COMMUNITY): Payer: Self-pay | Admitting: *Deleted

## 2021-05-25 DIAGNOSIS — F1910 Other psychoactive substance abuse, uncomplicated: Secondary | ICD-10-CM

## 2021-05-25 DIAGNOSIS — Y906 Blood alcohol level of 120-199 mg/100 ml: Secondary | ICD-10-CM | POA: Insufficient documentation

## 2021-05-25 DIAGNOSIS — R4689 Other symptoms and signs involving appearance and behavior: Secondary | ICD-10-CM | POA: Insufficient documentation

## 2021-05-25 DIAGNOSIS — F1994 Other psychoactive substance use, unspecified with psychoactive substance-induced mood disorder: Secondary | ICD-10-CM | POA: Diagnosis present

## 2021-05-25 DIAGNOSIS — D72829 Elevated white blood cell count, unspecified: Secondary | ICD-10-CM | POA: Insufficient documentation

## 2021-05-25 DIAGNOSIS — F1914 Other psychoactive substance abuse with psychoactive substance-induced mood disorder: Secondary | ICD-10-CM | POA: Insufficient documentation

## 2021-05-25 DIAGNOSIS — R000 Tachycardia, unspecified: Secondary | ICD-10-CM | POA: Insufficient documentation

## 2021-05-25 DIAGNOSIS — Z79899 Other long term (current) drug therapy: Secondary | ICD-10-CM | POA: Insufficient documentation

## 2021-05-25 DIAGNOSIS — F17210 Nicotine dependence, cigarettes, uncomplicated: Secondary | ICD-10-CM | POA: Insufficient documentation

## 2021-05-25 DIAGNOSIS — R456 Violent behavior: Secondary | ICD-10-CM | POA: Insufficient documentation

## 2021-05-25 DIAGNOSIS — K0889 Other specified disorders of teeth and supporting structures: Secondary | ICD-10-CM | POA: Insufficient documentation

## 2021-05-25 DIAGNOSIS — F250 Schizoaffective disorder, bipolar type: Secondary | ICD-10-CM | POA: Diagnosis present

## 2021-05-25 DIAGNOSIS — S61210A Laceration without foreign body of right index finger without damage to nail, initial encounter: Secondary | ICD-10-CM | POA: Insufficient documentation

## 2021-05-25 DIAGNOSIS — W228XXA Striking against or struck by other objects, initial encounter: Secondary | ICD-10-CM | POA: Insufficient documentation

## 2021-05-25 DIAGNOSIS — J45909 Unspecified asthma, uncomplicated: Secondary | ICD-10-CM | POA: Insufficient documentation

## 2021-05-25 DIAGNOSIS — Z23 Encounter for immunization: Secondary | ICD-10-CM | POA: Insufficient documentation

## 2021-05-25 DIAGNOSIS — F259 Schizoaffective disorder, unspecified: Secondary | ICD-10-CM

## 2021-05-25 DIAGNOSIS — I10 Essential (primary) hypertension: Secondary | ICD-10-CM | POA: Insufficient documentation

## 2021-05-25 LAB — COMPREHENSIVE METABOLIC PANEL
ALT: 50 U/L — ABNORMAL HIGH (ref 0–44)
AST: 32 U/L (ref 15–41)
Albumin: 5.1 g/dL — ABNORMAL HIGH (ref 3.5–5.0)
Alkaline Phosphatase: 79 U/L (ref 38–126)
Anion gap: 13 (ref 5–15)
BUN: 17 mg/dL (ref 6–20)
CO2: 22 mmol/L (ref 22–32)
Calcium: 9.2 mg/dL (ref 8.9–10.3)
Chloride: 101 mmol/L (ref 98–111)
Creatinine, Ser: 1.15 mg/dL (ref 0.61–1.24)
GFR, Estimated: >60 mL/min (ref >60)
Glucose, Bld: 92 mg/dL (ref 70–99)
Potassium: 3.6 mmol/L (ref 3.5–5.1)
Sodium: 136 mmol/L (ref 135–145)
Total Bilirubin: 0.5 mg/dL (ref 0.3–1.2)
Total Protein: 7.9 g/dL (ref 6.5–8.1)

## 2021-05-25 LAB — CBC
HCT: 46.4 % (ref 39.0–52.0)
Hemoglobin: 16 g/dL (ref 13.0–17.0)
MCH: 31.7 pg (ref 26.0–34.0)
MCHC: 34.5 g/dL (ref 30.0–36.0)
MCV: 92.1 fL (ref 80.0–100.0)
Platelets: 308 10*3/uL (ref 150–400)
RBC: 5.04 MIL/uL (ref 4.22–5.81)
RDW: 12.8 % (ref 11.5–15.5)
WBC: 13.9 10*3/uL — ABNORMAL HIGH (ref 4.0–10.5)
nRBC: 0 % (ref 0.0–0.2)

## 2021-05-25 LAB — RAPID URINE DRUG SCREEN, HOSP PERFORMED
Amphetamines: NOT DETECTED
Barbiturates: NOT DETECTED
Benzodiazepines: NOT DETECTED
Cocaine: POSITIVE — AB
Opiates: NOT DETECTED
Tetrahydrocannabinol: POSITIVE — AB

## 2021-05-25 LAB — ETHANOL: Alcohol, Ethyl (B): 143 mg/dL — ABNORMAL HIGH (ref <10)

## 2021-05-25 MED ORDER — TRAZODONE HCL 50 MG PO TABS
100.0000 mg | ORAL_TABLET | Freq: Every evening | ORAL | Status: DC | PRN
  Administered 2021-05-26: 100 mg via ORAL
  Filled 2021-05-25: qty 2

## 2021-05-25 MED ORDER — LORAZEPAM 1 MG PO TABS: 0.0000 mg | ORAL_TABLET | Freq: Two times a day (BID) | ORAL | Status: DC

## 2021-05-25 MED ORDER — AMOXICILLIN-POT CLAVULANATE 875-125 MG PO TABS: 1.0000 | ORAL_TABLET | Freq: Two times a day (BID) | ORAL | 0 refills | Status: DC

## 2021-05-25 MED ORDER — ARIPIPRAZOLE ER 400 MG IM SRER
400.0000 mg | INTRAMUSCULAR | Status: DC
  Filled 2021-05-25: qty 2

## 2021-05-25 MED ORDER — GABAPENTIN 100 MG PO CAPS
100.0000 mg | ORAL_CAPSULE | Freq: Three times a day (TID) | ORAL | Status: DC
  Administered 2021-05-26 (×2): 100 mg via ORAL
  Filled 2021-05-25 (×2): qty 1

## 2021-05-25 MED ORDER — TETANUS-DIPHTH-ACELL PERTUSSIS 5-2.5-18.5 LF-MCG/0.5 IM SUSY
0.5000 mL | PREFILLED_SYRINGE | Freq: Once | INTRAMUSCULAR | Status: AC
  Administered 2021-05-25: 0.5 mL via INTRAMUSCULAR
  Filled 2021-05-25: qty 0.5

## 2021-05-25 MED ORDER — LORAZEPAM 2 MG/ML IJ SOLN
0.0000 mg | Freq: Four times a day (QID) | INTRAMUSCULAR | Status: DC
Start: 2021-05-25 — End: 2021-05-26

## 2021-05-25 MED ORDER — LIDOCAINE HCL (PF) 1 % IJ SOLN
30.0000 mL | Freq: Once | INTRAMUSCULAR | Status: AC
  Administered 2021-05-25: 30 mL
  Filled 2021-05-25: qty 30

## 2021-05-25 MED ORDER — ARIPIPRAZOLE 5 MG PO TABS
15.0000 mg | ORAL_TABLET | Freq: Every day | ORAL | Status: DC
  Administered 2021-05-26: 15 mg via ORAL
  Filled 2021-05-25: qty 3

## 2021-05-25 MED ORDER — ETODOLAC 200 MG PO CAPS
200.0000 mg | ORAL_CAPSULE | Freq: Once | ORAL | Status: AC
  Administered 2021-05-25: 200 mg via ORAL
  Filled 2021-05-25: qty 1

## 2021-05-25 MED ORDER — ACETAMINOPHEN 325 MG PO TABS
650.0000 mg | ORAL_TABLET | Freq: Four times a day (QID) | ORAL | Status: DC | PRN
  Administered 2021-05-26 (×2): 650 mg via ORAL
  Filled 2021-05-25 (×2): qty 2

## 2021-05-25 MED ORDER — NICOTINE 14 MG/24HR TD PT24
14.0000 mg | MEDICATED_PATCH | Freq: Once | TRANSDERMAL | Status: DC
  Administered 2021-05-25: 14 mg via TRANSDERMAL
  Filled 2021-05-25: qty 1

## 2021-05-25 MED ORDER — AMOXICILLIN-POT CLAVULANATE 875-125 MG PO TABS
1.0000 | ORAL_TABLET | Freq: Once | ORAL | Status: AC
  Administered 2021-05-25: 1 via ORAL
  Filled 2021-05-25: qty 1

## 2021-05-25 MED ORDER — AMLODIPINE BESYLATE 5 MG PO TABS
10.0000 mg | ORAL_TABLET | Freq: Every day | ORAL | Status: DC
  Administered 2021-05-26: 10 mg via ORAL
  Filled 2021-05-25: qty 2

## 2021-05-25 MED ORDER — LORAZEPAM 2 MG/ML IJ SOLN: 0.0000 mg | Freq: Two times a day (BID) | INTRAMUSCULAR | Status: DC

## 2021-05-25 MED ORDER — ATORVASTATIN CALCIUM 10 MG PO TABS
20.0000 mg | ORAL_TABLET | Freq: Every day | ORAL | Status: DC
  Administered 2021-05-26: 20 mg via ORAL
  Filled 2021-05-25: qty 2

## 2021-05-25 MED ORDER — BUSPIRONE HCL 5 MG PO TABS
15.0000 mg | ORAL_TABLET | Freq: Three times a day (TID) | ORAL | Status: DC
  Administered 2021-05-26 (×2): 15 mg via ORAL
  Filled 2021-05-25 (×2): qty 3

## 2021-05-25 MED ORDER — THIAMINE HCL 100 MG PO TABS
100.0000 mg | ORAL_TABLET | Freq: Every day | ORAL | Status: DC
  Administered 2021-05-25 – 2021-05-26 (×2): 100 mg via ORAL
  Filled 2021-05-25 (×2): qty 1

## 2021-05-25 MED ORDER — LORAZEPAM 1 MG PO TABS
0.0000 mg | ORAL_TABLET | Freq: Four times a day (QID) | ORAL | Status: DC
Start: 2021-05-25 — End: 2021-05-26
  Administered 2021-05-25 – 2021-05-26 (×4): 1 mg via ORAL
  Filled 2021-05-25 (×4): qty 1

## 2021-05-25 MED ORDER — THIAMINE HCL 100 MG/ML IJ SOLN: 100.0000 mg | Freq: Every day | INTRAMUSCULAR | Status: DC

## 2021-05-25 MED ORDER — SODIUM CHLORIDE 0.9 % IV BOLUS
1000.0000 mL | Freq: Once | INTRAVENOUS | Status: AC
  Administered 2021-05-25: 1000 mL via INTRAVENOUS
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
## 2021-05-25 NOTE — ED Triage Notes (Signed)
Wanded in triage by security

## 2021-05-25 NOTE — ED Notes (Signed)
Pt. Refusing for this nurse to do anything until they get their pain medication.

## 2021-05-25 NOTE — ED Notes (Signed)
TTS IN PROGRESS 

## 2021-05-25 NOTE — ED Triage Notes (Signed)
Out of medication for the past week and has been drinking alcohol to compensate, also has dental pain and pain due to laceration right hand. States his nerves ae shot

## 2021-05-25 NOTE — Discharge Instructions (Addendum)
You will need to have your stitches removed in 7 to 10 days (7/26-7/29).  You were given Augmentin due to concern for dental infection.  Please take as directed.  You were given resources to follow-up with a dentist.  Please do so within the next 1 to 2 weeks.  Please follow up with your primary care provider within 5-7 days for re-evaluation of your symptoms. If you do not have a primary care provider, information for a healthcare clinic has been provided for you to make arrangements for follow up care. Please return to the emergency department for any new or worsening symptoms.
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
## 2021-05-25 NOTE — ED Notes (Signed)
Patient changed into maroon scrubs at this time 

## 2021-05-26 ENCOUNTER — Other Ambulatory Visit: Payer: Self-pay | Admitting: Family Medicine

## 2021-05-26 DIAGNOSIS — F1994 Other psychoactive substance use, unspecified with psychoactive substance-induced mood disorder: Secondary | ICD-10-CM

## 2021-05-26 MED ORDER — GABAPENTIN 100 MG PO CAPS: 100.0000 mg | ORAL_CAPSULE | Freq: Three times a day (TID) | ORAL | 0 refills | Status: DC

## 2021-05-26 MED ORDER — ARIPIPRAZOLE ER 400 MG IM SRER
400.0000 mg | INTRAMUSCULAR | Status: DC
  Administered 2021-05-26: 400 mg via INTRAMUSCULAR
  Filled 2021-05-26: qty 2

## 2021-05-26 MED ORDER — BUSPIRONE HCL 15 MG PO TABS: 15.0000 mg | ORAL_TABLET | Freq: Three times a day (TID) | ORAL | 0 refills | Status: DC

## 2021-05-26 MED ORDER — TRAZODONE HCL 100 MG PO TABS: 100.0000 mg | ORAL_TABLET | Freq: Every evening | ORAL | 0 refills | Status: DC | PRN

## 2021-05-26 MED ORDER — ATORVASTATIN CALCIUM 20 MG PO TABS: 20.0000 mg | ORAL_TABLET | Freq: Every day | ORAL | 0 refills | Status: DC

## 2021-05-26 MED ORDER — AMLODIPINE BESYLATE 10 MG PO TABS: 10.0000 mg | ORAL_TABLET | Freq: Every day | ORAL | 0 refills | Status: DC
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
## 2021-05-26 NOTE — ED Notes (Signed)
Pt resting in bed, denies SI/HI/AVH. Being treated for CIWA protocol. Pt is voluntary. Pt updated on POC. Will continue to monitor.

## 2021-05-26 NOTE — ED Provider Notes (Signed)
Emergency Medicine Observation Re-evaluation Note  Lance Abbott is a 41 y.o. male, seen on rounds today.  Pt initially presented to the ED for complaints of V70.1 Currently, the patient is resting comfortably.  10:30 AM  Physical Exam  BP (!) 139/96   Pulse 98   Temp 98.2 F (36.8 C) (Oral)   Resp 16   SpO2 97%  Physical Exam General: Nontoxic appearance Cardiac: Normal heart rate Lungs: Normal respiratory Psych: No apparent internal responsiveness  ED Course / MDM  EKG:   I have reviewed the labs performed to date as well as medications administered while in observation.  Recent changes in the last 24 hours include he has been by psychiatry.  Social work has been involved to help him get his medicines since he states he cannot get them.  Plan  Current plan is for complete psychiatric treatment with dose of long-acting Abilify then discharge.  Prescription for Augmentin sent to treat possible dental infection.  Lance Abbott is not under involuntary commitment.     Lance Bale, MD 05/26/21 [phone]
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
## 2021-06-22 ENCOUNTER — Other Ambulatory Visit: Payer: Self-pay | Admitting: Family Medicine

## 2021-07-19 ENCOUNTER — Ambulatory Visit (HOSPITAL_COMMUNITY)
Admission: EM | Admit: 2021-07-19 | Discharge: 2021-07-19 | Disposition: A | Discharge status: Home / Self Care | Payer: No Payment, Other | Attending: Family | Admitting: Family

## 2021-07-19 ENCOUNTER — Other Ambulatory Visit: Payer: Self-pay

## 2021-07-19 DIAGNOSIS — F1014 Alcohol abuse with alcohol-induced mood disorder: Secondary | ICD-10-CM | POA: Insufficient documentation

## 2021-07-19 DIAGNOSIS — Z5989 Other problems related to housing and economic circumstances: Secondary | ICD-10-CM | POA: Diagnosis not present

## 2021-07-19 DIAGNOSIS — Z733 Stress, not elsewhere classified: Secondary | ICD-10-CM | POA: Diagnosis not present

## 2021-07-19 DIAGNOSIS — F1994 Other psychoactive substance use, unspecified with psychoactive substance-induced mood disorder: Secondary | ICD-10-CM
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
## 2021-07-19 NOTE — ED Provider Notes (Signed)
Behavioral Health Urgent Care Medical Screening Exam  Patient Name: Lance Abbott MRN: [id number] Date of Evaluation: 07/19/21 Chief Complaint:  Suicidal, Depressed and detox Diagnosis:  Final diagnoses:  Substance induced mood disorder (HCC)    History of Present illness: Lance Abbott is a 40 y.o. male.  Presents to [facility] urgent care reporting passive suicidal ideations.  States " I need help with my alcohol addiction."  Stated that he has been drinking daily.  Patient was provided additional outpatient resources however states he was unable to follow-up with appointments due to " gas money and bed availability."  Patient reported [facility] had a COVID outbreak so he was not able to attend.  He reports auditory hallucinations, denied voice are command in nature. "Sometime they teel me to kill myself."  States that his symptoms is worse when drinking.  Authur reported " I just need a few days to get my thoughts together."  And is requesting to come inpatient.,  Reported last inpatient admission was 3 months prior.  This NP will continue to recommend patient to follow-up with [facility] recovery services and Addiction recovery associates. Patient reported " I do not have a substance abuse problem, I need my meds and mental health checked."  NP offered to restart medications.  Patient declined stating that he would follow-up with the local hospital.  Support, encouragement and reassurance was provided.   Psychiatric Specialty Exam  Presentation  General Appearance:Appropriate for Environment  Eye Contact:Good  Speech:Clear and Coherent  Speech Volume:Normal  Handedness:Right   Mood and Affect  Mood:Depressed  Affect:Congruent   Thought Process  Thought Processes:Coherent  Descriptions of Associations:Intact  Orientation:Full (Time, Place and Person)  Thought Content:Logical  Diagnosis of Schizophrenia or Schizoaffective disorder in past: Yes  Duration of Psychotic Symptoms:  Greater than six months  Hallucinations:None See HPI for details. See HPI for details.  Ideas of Reference:None  Suicidal Thoughts:Yes, Passive With Intent; With Plan; With Means to Carry Out; With Access to Means Without Intent  Homicidal Thoughts:No Without Intent; Without Plan   Sensorium  Memory:Immediate Good; Recent Good; Remote Good  Judgment:Fair  Insight:Fair   Executive Functions  Concentration:Fair  Attention Span:Fair  Recall:Good  Fund of Knowledge:Good  Language:Good   Psychomotor Activity  Psychomotor Activity:Normal   Assets  Assets:Desire for Improvement; Resilience; Social Support   Sleep  Sleep:Fair  Number of hours: 6.75   Nutritional Assessment (For OBS and FBC admissions only) Has the patient had a weight loss or gain of 10 pounds or more in the last 3 months?: No Has the patient had a decrease in food intake/or appetite?: No Does the patient have dental problems?: No Does the patient have eating habits or behaviors that may be indicators of an eating disorder including binging or inducing vomiting?: No Has the patient recently lost weight without trying?: 0 Has the patient been eating poorly because of a decreased appetite?: 0 Malnutrition Screening Tool Score: 0   Physical Exam: Physical Exam Vitals and nursing note reviewed.  HENT:     Head: Normocephalic.  Eyes:     Pupils: Pupils are equal, round, and reactive to light.  Cardiovascular:     Rate and Rhythm: Normal rate.     Pulses: Normal pulses.  Neurological:     Mental Status: He is oriented to person, place, and time.  Psychiatric:        Attention and Perception: Attention normal.        Mood and Affect: Mood normal.  Speech: Speech normal.        Behavior: Behavior normal. Behavior is cooperative.        Thought Content: Thought content includes suicidal ideation. Thought content does not include suicidal plan.        Cognition and Memory: Cognition  normal.        Judgment: Judgment normal.   Review of Systems  Constitutional: Negative.   Respiratory: Negative.    Cardiovascular: Negative.   Gastrointestinal: Negative.   Musculoskeletal: Negative.   Endo/Heme/Allergies: Negative.   Psychiatric/Behavioral:  Positive for depression, hallucinations, substance abuse and suicidal ideas. The patient has insomnia.   All other systems reviewed and are negative. Blood pressure (!) 137/106, pulse 90, temperature 98.6 F (37 C), temperature source Oral, resp. rate 16, SpO2 100 %. There is no height or weight on file to calculate BMI.  Musculoskeletal: Strength & Muscle Tone: within normal limits Gait & Station: normal Patient leans: N/A   [facility] MSE Discharge Disposition for Follow up and Recommendations: Based on my evaluation the patient does not appear to have an emergency medical condition and can be discharged with resources and follow up care in outpatient services for Partial Hospitalization Program and Substance Abuse Intensive Outpatient Program   Oneta Rack, NP 07/19/2021, 12:57 PM

## 2021-07-19 NOTE — ED Notes (Signed)
Pt discharged with  AVS.  AVS reviewed prior to discharge.  Pt alert, oriented, and ambulatory.  Safety maintained.  °

## 2021-07-19 NOTE — Discharge Instructions (Addendum)
Take all medications as prescribed. Keep all follow-up appointments as scheduled.  Do not consume alcohol or use illegal drugs while on prescription medications. Report any adverse effects from your medications to your primary care provider promptly.  In the event of recurrent symptoms or worsening symptoms, call 911, a crisis hotline, or go to the nearest emergency department for evaluation.   

## 2021-07-20 ENCOUNTER — Other Ambulatory Visit: Payer: Self-pay

## 2021-07-20 ENCOUNTER — Emergency Department (HOSPITAL_COMMUNITY)
Admission: EM | Admit: 2021-07-20 | Discharge: 2021-07-20 | Disposition: A | Discharge status: Home / Self Care | Payer: Self-pay | Attending: Emergency Medicine | Admitting: Emergency Medicine

## 2021-07-20 ENCOUNTER — Encounter (HOSPITAL_COMMUNITY): Payer: Self-pay

## 2021-07-20 DIAGNOSIS — Z79899 Other long term (current) drug therapy: Secondary | ICD-10-CM | POA: Insufficient documentation

## 2021-07-20 DIAGNOSIS — Z760 Encounter for issue of repeat prescription: Secondary | ICD-10-CM | POA: Insufficient documentation

## 2021-07-20 DIAGNOSIS — J45909 Unspecified asthma, uncomplicated: Secondary | ICD-10-CM | POA: Insufficient documentation

## 2021-07-20 DIAGNOSIS — F1010 Alcohol abuse, uncomplicated: Secondary | ICD-10-CM | POA: Insufficient documentation

## 2021-07-20 DIAGNOSIS — F17210 Nicotine dependence, cigarettes, uncomplicated: Secondary | ICD-10-CM | POA: Insufficient documentation

## 2021-07-20 LAB — COMPREHENSIVE METABOLIC PANEL
ALT: 38 U/L (ref 0–44)
AST: 35 U/L (ref 15–41)
Albumin: 5.3 g/dL — ABNORMAL HIGH (ref 3.5–5.0)
Alkaline Phosphatase: 84 U/L (ref 38–126)
Anion gap: 12 (ref 5–15)
BUN: 23 mg/dL — ABNORMAL HIGH (ref 6–20)
CO2: 24 mmol/L (ref 22–32)
Calcium: 9.8 mg/dL (ref 8.9–10.3)
Chloride: 99 mmol/L (ref 98–111)
Creatinine, Ser: 0.98 mg/dL (ref 0.61–1.24)
GFR, Estimated: >60 mL/min (ref >60)
Glucose, Bld: 101 mg/dL — ABNORMAL HIGH (ref 70–99)
Potassium: 3.8 mmol/L (ref 3.5–5.1)
Sodium: 135 mmol/L (ref 135–145)
Total Bilirubin: 0.6 mg/dL (ref 0.3–1.2)
Total Protein: 8.2 g/dL — ABNORMAL HIGH (ref 6.5–8.1)

## 2021-07-20 LAB — CBC
HCT: 47 % (ref 39.0–52.0)
Hemoglobin: 16.1 g/dL (ref 13.0–17.0)
MCH: 31.1 pg (ref 26.0–34.0)
MCHC: 34.3 g/dL (ref 30.0–36.0)
MCV: 90.9 fL (ref 80.0–100.0)
Platelets: 316 10*3/uL (ref 150–400)
RBC: 5.17 MIL/uL (ref 4.22–5.81)
RDW: 13.1 % (ref 11.5–15.5)
WBC: 16.5 10*3/uL — ABNORMAL HIGH (ref 4.0–10.5)
nRBC: 0 % (ref 0.0–0.2)

## 2021-07-20 LAB — RAPID URINE DRUG SCREEN, HOSP PERFORMED
Amphetamines: NOT DETECTED
Barbiturates: NOT DETECTED
Benzodiazepines: NOT DETECTED
Cocaine: NOT DETECTED
Opiates: NOT DETECTED
Tetrahydrocannabinol: POSITIVE — AB

## 2021-07-20 LAB — ETHANOL: Alcohol, Ethyl (B): <10 mg/dL (ref <10)

## 2021-07-20 MED ORDER — THIAMINE HCL 100 MG PO TABS
100.0000 mg | ORAL_TABLET | Freq: Once | ORAL | Status: AC
  Administered 2021-07-20: 100 mg via ORAL
  Filled 2021-07-20: qty 1

## 2021-07-20 MED ORDER — LORAZEPAM 1 MG PO TABS
1.0000 mg | ORAL_TABLET | Freq: Once | ORAL | Status: AC
  Administered 2021-07-20: 1 mg via ORAL
  Filled 2021-07-20: qty 1

## 2021-07-20 MED ORDER — ADULT MULTIVITAMIN W/MINERALS CH
1.0000 | ORAL_TABLET | Freq: Once | ORAL | Status: AC
  Administered 2021-07-20: 1 via ORAL
  Filled 2021-07-20: qty 1

## 2021-07-20 MED ORDER — FOLIC ACID 1 MG PO TABS
1.0000 mg | ORAL_TABLET | Freq: Once | ORAL | Status: AC
  Administered 2021-07-20: 1 mg via ORAL
  Filled 2021-07-20: qty 1

## 2021-07-20 MED ORDER — NICOTINE 21 MG/24HR TD PT24
21.0000 mg | MEDICATED_PATCH | Freq: Once | TRANSDERMAL | Status: DC
  Administered 2021-07-20: 21 mg via TRANSDERMAL
  Filled 2021-07-20: qty 1

## 2021-07-20 MED ORDER — BUSPIRONE HCL 15 MG PO TABS: 15.0000 mg | ORAL_TABLET | Freq: Three times a day (TID) | ORAL | 0 refills | Status: DC

## 2021-07-20 MED ORDER — CHLORDIAZEPOXIDE HCL 25 MG PO CAPS: ORAL_CAPSULE | ORAL | 0 refills | Status: DC

## 2021-07-20 MED ORDER — AMLODIPINE BESYLATE 10 MG PO TABS: 10.0000 mg | ORAL_TABLET | Freq: Every day | ORAL | 0 refills | Status: DC

## 2021-07-20 MED ORDER — ATORVASTATIN CALCIUM 20 MG PO TABS: 20.0000 mg | ORAL_TABLET | Freq: Every day | ORAL | 0 refills | Status: DC

## 2021-07-20 NOTE — ED Triage Notes (Signed)
Pt presents to ED with complaints of alcohol withdrawals. States his last drink was yesterday, normally drinks 2 40 oz beers and a pint of whiskey a day.

## 2021-07-20 NOTE — Discharge Instructions (Addendum)
Please contact [facility] to arrange a follow-up appointment.  Turn to the emergency department for any new or worsening symptoms.
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
## 2021-09-18 NOTE — Congregational Nurse Program (Signed)
  Dept: [phone]   Congregational Nurse Program Note  Date of Encounter: 08/25/2021  Past Medical History: Past Medical History:  Diagnosis Date   Anxiety    Asthma    Bipolar 1 disorder (HCC)    Chronic pain    Depression    Kidney failure    per pt report only   Panic attacks    Polysubstance abuse (HCC)    Respiratory failure (HCC)    "double respiratory failure" per pt report    Encounter Details:  CNP Questionnaire - 09/18/21 2224       Questionnaire   Do you give verbal consent to treat you today? Yes    Location Patient Information systems manager, [employer] or Organization    Patient Status Homeless    Insurance Uninsured ([employer]/Self-Pay)    Insurance Referral N/A    Medication N/A    Medical Provider No    Screening Referrals N/A    Medical Referral N/A    Medical Appointment Made N/A    Food Have Food Insecurities    Transportation Need transportation assistance    Housing/Utilities No permanent housing;Referred to homeless shelter, day center    Interpersonal Safety N/A    Intervention Advocate    ED Visit Averted N/A    Life-Saving Intervention Made N/A           Sfated he was homeless and trying to find a place to stay.told about [facility] but said he didn't want to go that far. He was going to see if  his friend would let him stay with him. Very anxious and talkative.  Did not want his vital signs taken. Jenene Slicker RN, [place], [phone]

## 2021-10-27 IMAGING — CT CT HEAD W/O CM
3 series · 15 of 47 positions shown, 18 images · non-contrast
Comparison: None.

CLINICAL DATA: Fall left-sided head injury

EXAM:
CT HEAD WITHOUT CONTRAST
TECHNIQUE: Contiguous axial images were obtained from the base of the skull
through the vertex without intravenous contrast.

[Series 2: head w o · axial · 0.51mm/px · z∈[-162,-27]mm · 9 of 33 slices shown, 12 images]
[im 3/33  brain]
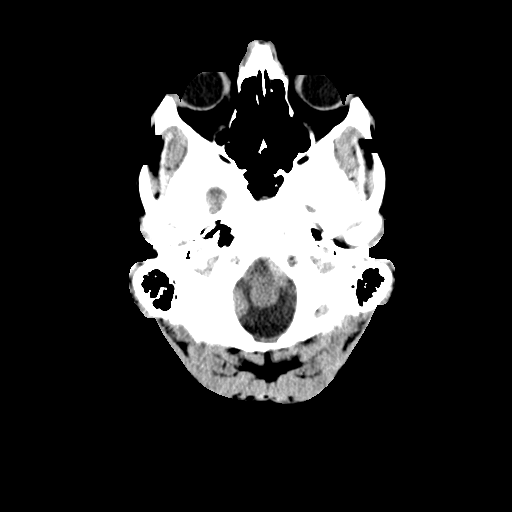
[im 3/33  bone]
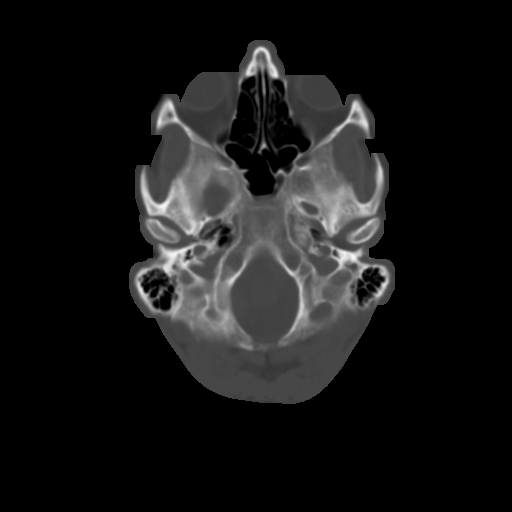
[im 6/33  brain]
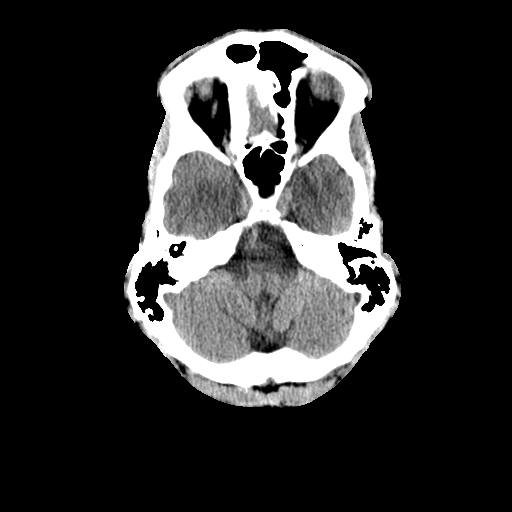
[im 9/33  brain]
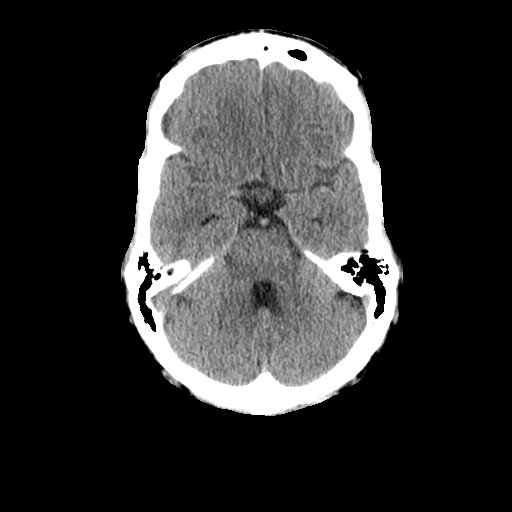
[im 13/33  brain]
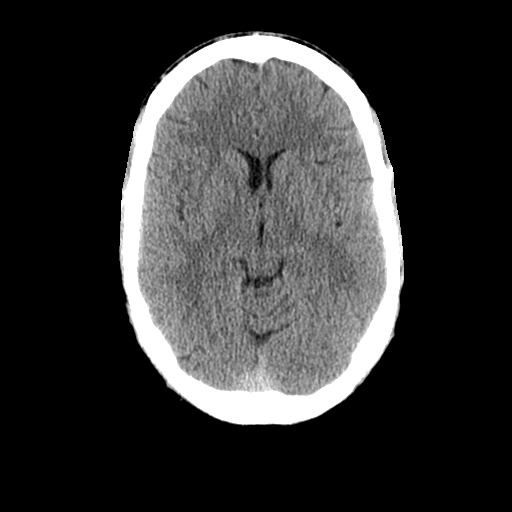
[im 17/33  brain]
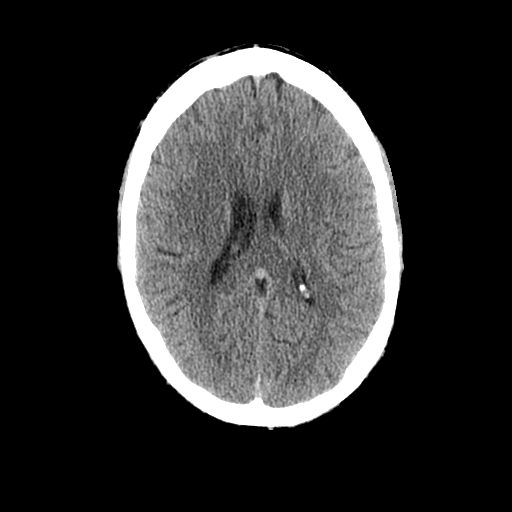
[im 17/33  bone]
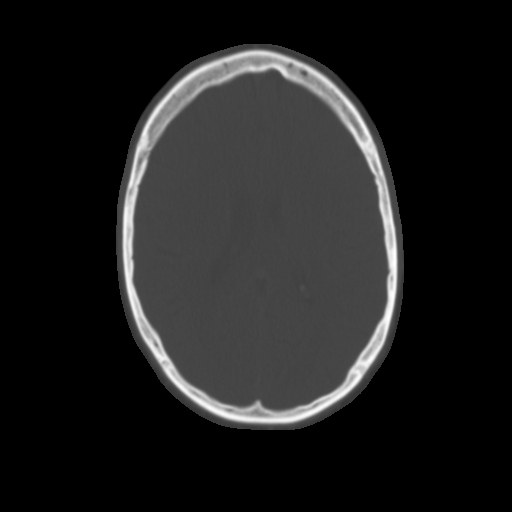
[im 20/33  brain]
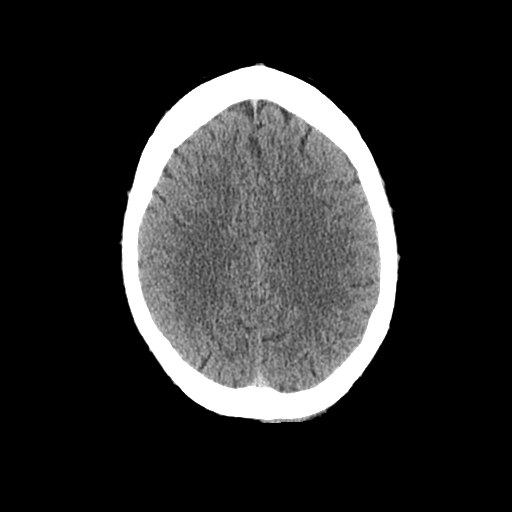
[im 24/33  brain]
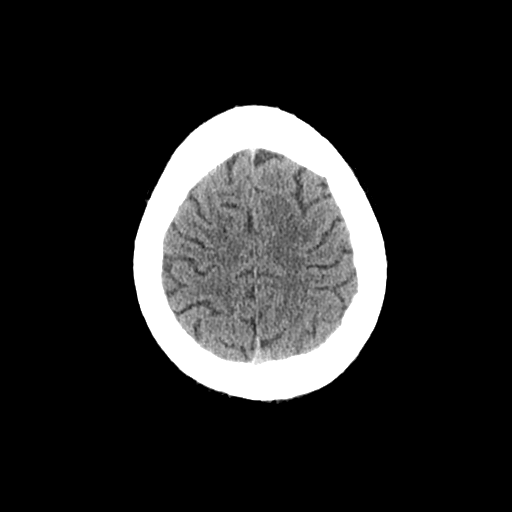
[im 27/33  brain]
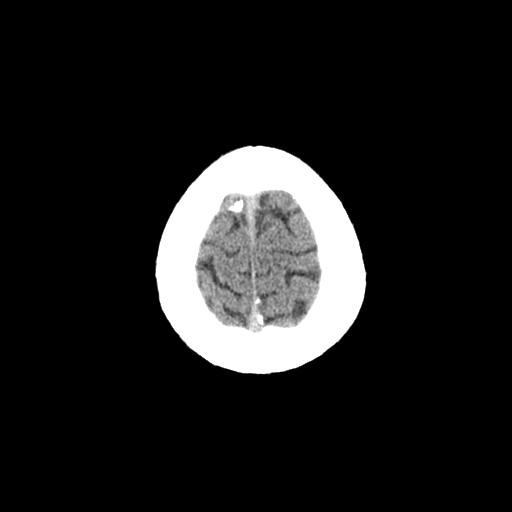
[im 30/33  brain]
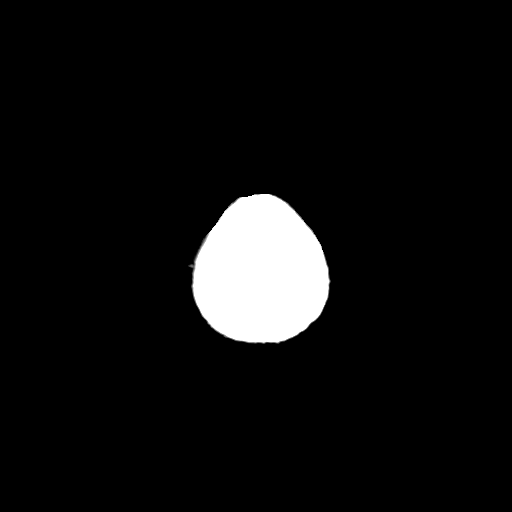
[im 30/33  bone]
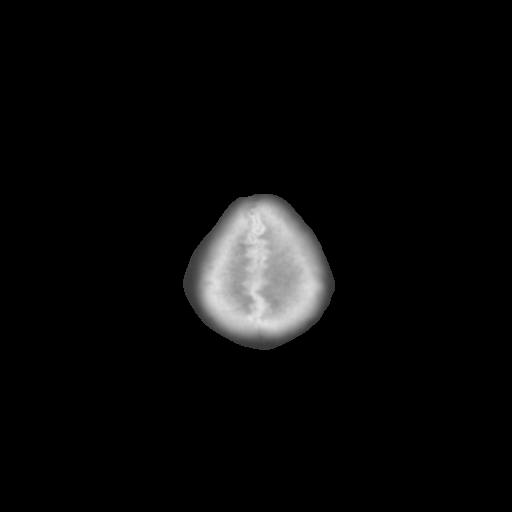

[Series 4: coronal soft · coronal · 0.33mm/px · 3 of 74 slices shown]
[im 25/74  brain]
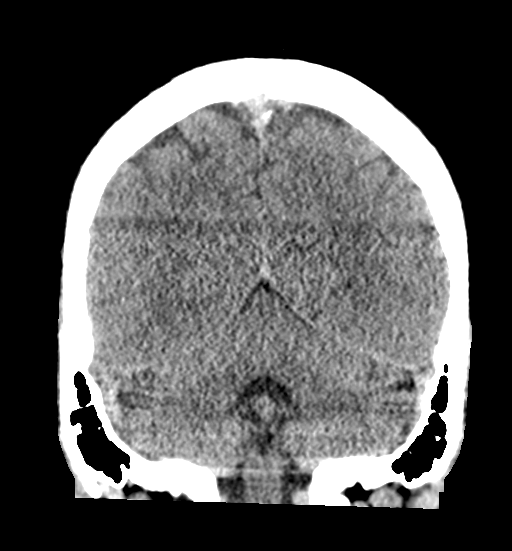
[im 33/74  brain]
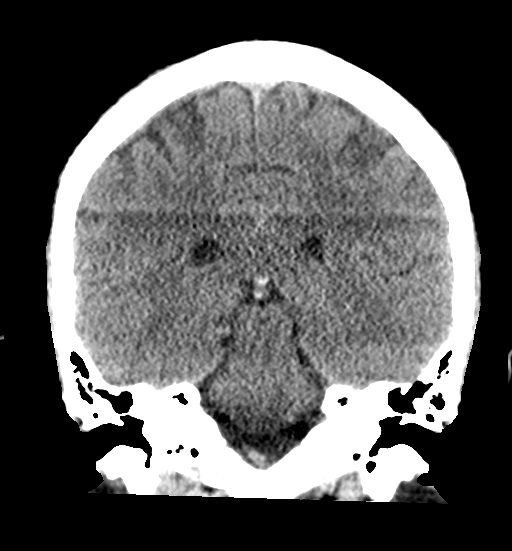
[im 41/74  brain]
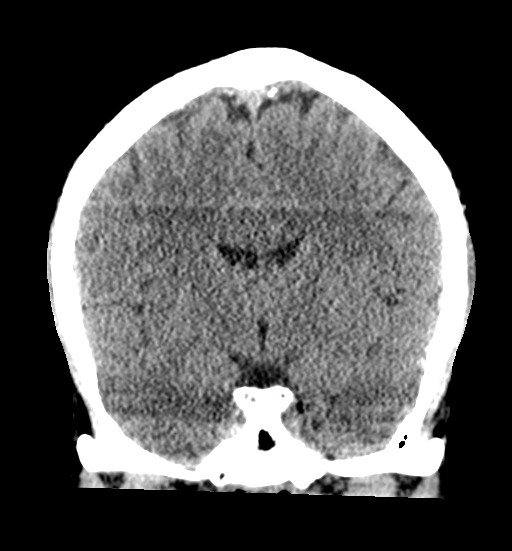

[Series 5: sagittal soft · sagittal · 0.36mm/px · 3 of 58 slices shown]
[im 20/58  brain]
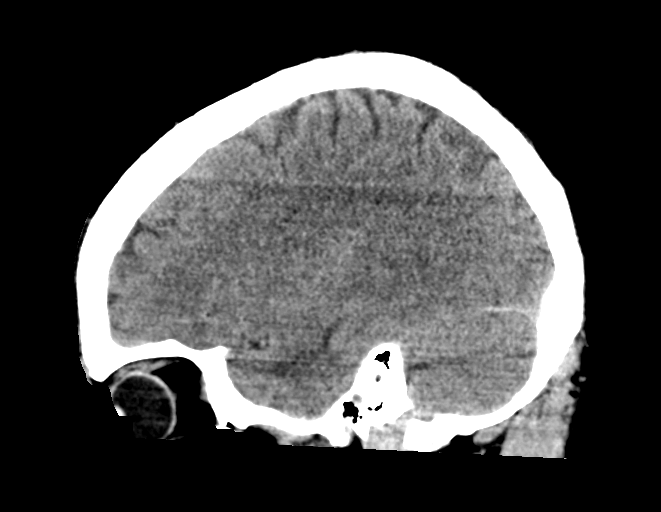
[im 29/58  brain]
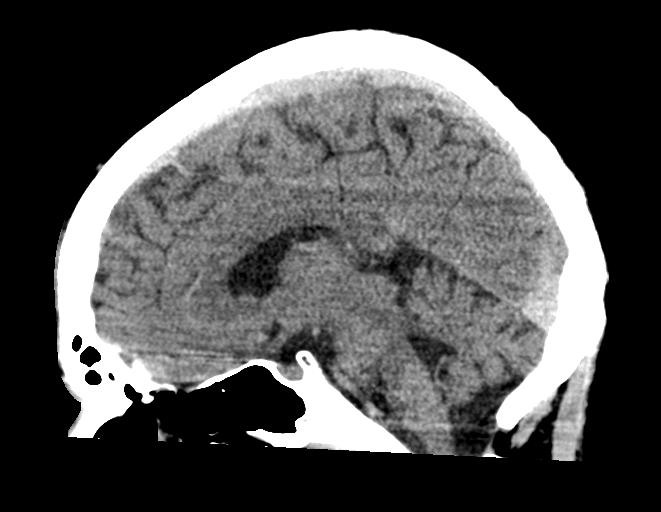
[im 39/58  brain]
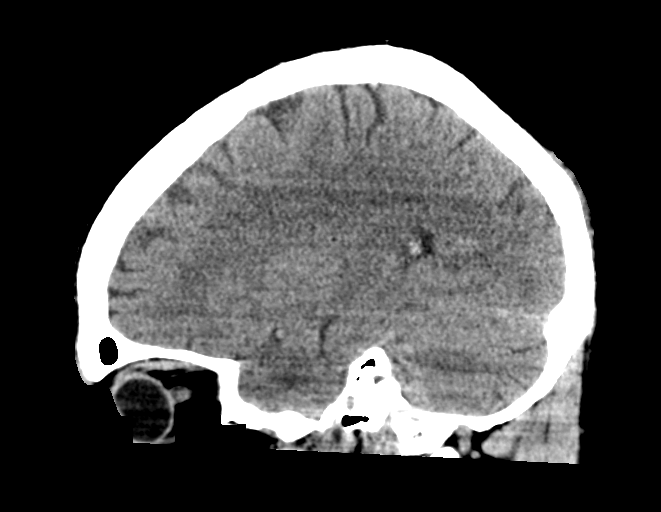

[15 of 47 positions shown; findings below may reference images not displayed]

FINDINGS: Brain: No evidence of acute territorial infarction, hemorrhage,
hydrocephalus,extra-axial collection or mass lesion/mass effect.
Normal gray-white differentiation. Ventricles are normal in size and
contour.

Vascular: No hyperdense vessel or unexpected calcification.

Skull: The skull is intact. No fracture or focal lesion identified.

Sinuses/Orbits: The visualized paranasal sinuses and mastoid air
cells are clear. The orbits and globes intact.

Other: None

Cervical spine:

Alignment: Physiologic

Skull base and vertebrae: Visualized skull base is intact. No
atlanto-occipital dissociation. The vertebral body heights are well
maintained. No fracture or pathologic osseous lesion seen.

Soft tissues and spinal canal: The visualized paraspinal soft
tissues are unremarkable. No prevertebral soft tissue swelling is
seen. The spinal canal is grossly unremarkable, no large epidural
collection or significant canal narrowing.

Disc levels:  No significant canal or neural foraminal narrowing.

Upper chest: The lung apices are clear. Thoracic inlet is within
normal limits.

Other: None
IMPRESSION: No acute intracranial abnormality.

No acute fracture or malalignment of the spine.

## 2022-01-02 ENCOUNTER — Other Ambulatory Visit (HOSPITAL_COMMUNITY)
Admission: EM | Admit: 2022-01-02 | Discharge: 2022-01-04 | Disposition: A | Discharge status: Home / Self Care | Payer: No Payment, Other | Attending: Psychiatry | Admitting: Psychiatry

## 2022-01-02 ENCOUNTER — Encounter (HOSPITAL_COMMUNITY): Payer: Self-pay | Admitting: Emergency Medicine

## 2022-01-02 ENCOUNTER — Ambulatory Visit (HOSPITAL_COMMUNITY)
Admission: RE | Admit: 2022-01-02 | Discharge: 2022-01-02 | Disposition: A | Discharge status: Home / Self Care | Payer: No Payment, Other | Attending: Psychiatry | Admitting: Psychiatry

## 2022-01-02 ENCOUNTER — Other Ambulatory Visit: Payer: Self-pay

## 2022-01-02 ENCOUNTER — Emergency Department (HOSPITAL_COMMUNITY)
Admission: EM | Admit: 2022-01-02 | Discharge: 2022-01-02 | Disposition: A | Discharge status: Other Acute Inpatient Hospital | Payer: Self-pay | Attending: Emergency Medicine | Admitting: Emergency Medicine

## 2022-01-02 DIAGNOSIS — F250 Schizoaffective disorder, bipolar type: Secondary | ICD-10-CM

## 2022-01-02 DIAGNOSIS — Y904 Blood alcohol level of 80-99 mg/100 ml: Secondary | ICD-10-CM | POA: Insufficient documentation

## 2022-01-02 DIAGNOSIS — F1090 Alcohol use, unspecified, uncomplicated: Secondary | ICD-10-CM | POA: Insufficient documentation

## 2022-01-02 DIAGNOSIS — F1490 Cocaine use, unspecified, uncomplicated: Secondary | ICD-10-CM | POA: Insufficient documentation

## 2022-01-02 DIAGNOSIS — F1010 Alcohol abuse, uncomplicated: Secondary | ICD-10-CM | POA: Insufficient documentation

## 2022-01-02 DIAGNOSIS — K0889 Other specified disorders of teeth and supporting structures: Secondary | ICD-10-CM | POA: Insufficient documentation

## 2022-01-02 DIAGNOSIS — R45851 Suicidal ideations: Secondary | ICD-10-CM | POA: Insufficient documentation

## 2022-01-02 DIAGNOSIS — R440 Auditory hallucinations: Secondary | ICD-10-CM | POA: Insufficient documentation

## 2022-01-02 DIAGNOSIS — Z20822 Contact with and (suspected) exposure to covid-19: Secondary | ICD-10-CM | POA: Insufficient documentation

## 2022-01-02 DIAGNOSIS — Z79899 Other long term (current) drug therapy: Secondary | ICD-10-CM | POA: Insufficient documentation

## 2022-01-02 DIAGNOSIS — Z765 Malingerer [conscious simulation]: Secondary | ICD-10-CM | POA: Insufficient documentation

## 2022-01-02 DIAGNOSIS — F319 Bipolar disorder, unspecified: Secondary | ICD-10-CM | POA: Insufficient documentation

## 2022-01-02 DIAGNOSIS — F1290 Cannabis use, unspecified, uncomplicated: Secondary | ICD-10-CM | POA: Insufficient documentation

## 2022-01-02 DIAGNOSIS — F333 Major depressive disorder, recurrent, severe with psychotic symptoms: Secondary | ICD-10-CM | POA: Diagnosis present

## 2022-01-02 DIAGNOSIS — K047 Periapical abscess without sinus: Secondary | ICD-10-CM | POA: Insufficient documentation

## 2022-01-02 LAB — COMPREHENSIVE METABOLIC PANEL
ALT: 38 U/L (ref 0–44)
AST: 36 U/L (ref 15–41)
Albumin: 4.7 g/dL (ref 3.5–5.0)
Alkaline Phosphatase: 85 U/L (ref 38–126)
Anion gap: 10 (ref 5–15)
BUN: 16 mg/dL (ref 6–20)
CO2: 23 mmol/L (ref 22–32)
Calcium: 9.1 mg/dL (ref 8.9–10.3)
Chloride: 103 mmol/L (ref 98–111)
Creatinine, Ser: 0.66 mg/dL (ref 0.61–1.24)
GFR, Estimated: >60 mL/min (ref >60)
Glucose, Bld: 89 mg/dL (ref 70–99)
Potassium: 4.3 mmol/L (ref 3.5–5.1)
Sodium: 136 mmol/L (ref 135–145)
Total Bilirubin: 0.7 mg/dL (ref 0.3–1.2)
Total Protein: 7.8 g/dL (ref 6.5–8.1)

## 2022-01-02 LAB — ETHANOL: Alcohol, Ethyl (B): 82 mg/dL — ABNORMAL HIGH (ref <10)

## 2022-01-02 LAB — RESP PANEL BY RT-PCR (FLU A&B, COVID) ARPGX2
Influenza A by PCR: NEGATIVE
Influenza B by PCR: NEGATIVE
SARS Coronavirus 2 by RT PCR: NEGATIVE

## 2022-01-02 MED ORDER — THIAMINE HCL 100 MG/ML IJ SOLN: 100.0000 mg | Freq: Every day | INTRAMUSCULAR | Status: DC

## 2022-01-02 MED ORDER — LORAZEPAM 1 MG PO TABS
1.0000 mg | ORAL_TABLET | ORAL | Status: DC | PRN
  Administered 2022-01-02 (×2): 2 mg via ORAL
  Filled 2022-01-02 (×2): qty 2

## 2022-01-02 MED ORDER — ADULT MULTIVITAMIN W/MINERALS CH
1.0000 | ORAL_TABLET | Freq: Every day | ORAL | Status: DC
  Administered 2022-01-03: 1 via ORAL
  Filled 2022-01-02: qty 1

## 2022-01-02 MED ORDER — NICOTINE 21 MG/24HR TD PT24
21.0000 mg | MEDICATED_PATCH | Freq: Every day | TRANSDERMAL | Status: DC
  Administered 2022-01-02: 21 mg via TRANSDERMAL
  Filled 2022-01-02: qty 1

## 2022-01-02 MED ORDER — ARIPIPRAZOLE 15 MG PO TABS
15.0000 mg | ORAL_TABLET | Freq: Every day | ORAL | Status: DC
  Administered 2022-01-03 – 2022-01-04 (×2): 15 mg via ORAL
  Filled 2022-01-02: qty 1
  Filled 2022-01-02: qty 7
  Filled 2022-01-02: qty 1

## 2022-01-02 MED ORDER — LOPERAMIDE HCL 2 MG PO CAPS
2.0000 mg | ORAL_CAPSULE | ORAL | Status: DC | PRN
  Administered 2022-01-03: 4 mg via ORAL
  Filled 2022-01-02: qty 2

## 2022-01-02 MED ORDER — AMLODIPINE BESYLATE 10 MG PO TABS
10.0000 mg | ORAL_TABLET | Freq: Every day | ORAL | Status: DC
  Administered 2022-01-03 – 2022-01-04 (×2): 10 mg via ORAL
  Filled 2022-01-02: qty 1
  Filled 2022-01-02: qty 7
  Filled 2022-01-02: qty 1

## 2022-01-02 MED ORDER — BUPROPION HCL ER (XL) 150 MG PO TB24
150.0000 mg | ORAL_TABLET | Freq: Every day | ORAL | Status: DC
  Administered 2022-01-03: 150 mg via ORAL
  Filled 2022-01-02: qty 1

## 2022-01-02 MED ORDER — THIAMINE HCL 100 MG PO TABS
100.0000 mg | ORAL_TABLET | Freq: Every day | ORAL | Status: DC
  Administered 2022-01-02: 100 mg via ORAL
  Filled 2022-01-02: qty 1

## 2022-01-02 MED ORDER — ARIPIPRAZOLE 5 MG PO TABS
15.0000 mg | ORAL_TABLET | Freq: Every day | ORAL | Status: DC
  Administered 2022-01-02: 15 mg via ORAL
  Filled 2022-01-02: qty 1

## 2022-01-02 MED ORDER — TRAZODONE HCL 100 MG PO TABS: 100.0000 mg | ORAL_TABLET | Freq: Every evening | ORAL | Status: DC | PRN

## 2022-01-02 MED ORDER — ATORVASTATIN CALCIUM 10 MG PO TABS: 20.0000 mg | ORAL_TABLET | Freq: Every day | ORAL | Status: DC

## 2022-01-02 MED ORDER — ATORVASTATIN CALCIUM 10 MG PO TABS
20.0000 mg | ORAL_TABLET | Freq: Every day | ORAL | Status: DC
  Administered 2022-01-03 – 2022-01-04 (×2): 20 mg via ORAL
  Filled 2022-01-02 (×2): qty 2

## 2022-01-02 MED ORDER — QUETIAPINE FUMARATE 400 MG PO TABS
400.0000 mg | ORAL_TABLET | Freq: Every day | ORAL | Status: DC
  Administered 2022-01-02 – 2022-01-03 (×2): 400 mg via ORAL
  Filled 2022-01-02: qty 7
  Filled 2022-01-02 (×2): qty 2

## 2022-01-02 MED ORDER — ALUM & MAG HYDROXIDE-SIMETH 200-200-20 MG/5ML PO SUSP: 30.0000 mL | ORAL | Status: DC | PRN

## 2022-01-02 MED ORDER — ONDANSETRON 4 MG PO TBDP
4.0000 mg | ORAL_TABLET | Freq: Four times a day (QID) | ORAL | Status: DC | PRN
  Filled 2022-01-02: qty 1

## 2022-01-02 MED ORDER — BUSPIRONE HCL 15 MG PO TABS
15.0000 mg | ORAL_TABLET | Freq: Three times a day (TID) | ORAL | Status: DC
  Administered 2022-01-02 – 2022-01-03 (×2): 15 mg via ORAL
  Filled 2022-01-02 (×2): qty 1

## 2022-01-02 MED ORDER — FOLIC ACID 1 MG PO TABS
1.0000 mg | ORAL_TABLET | Freq: Every day | ORAL | Status: DC
  Administered 2022-01-02: 1 mg via ORAL
  Filled 2022-01-02: qty 1

## 2022-01-02 MED ORDER — ACETAMINOPHEN 325 MG PO TABS
650.0000 mg | ORAL_TABLET | Freq: Four times a day (QID) | ORAL | Status: DC | PRN
  Administered 2022-01-03 (×2): 650 mg via ORAL
  Filled 2022-01-02 (×2): qty 2

## 2022-01-02 MED ORDER — MAGNESIUM HYDROXIDE 400 MG/5ML PO SUSP: 30.0000 mL | Freq: Every day | ORAL | Status: DC | PRN

## 2022-01-02 MED ORDER — THIAMINE HCL 100 MG PO TABS
100.0000 mg | ORAL_TABLET | Freq: Every day | ORAL | Status: DC
  Administered 2022-01-03: 100 mg via ORAL
  Filled 2022-01-02: qty 1

## 2022-01-02 MED ORDER — LORAZEPAM 2 MG/ML IJ SOLN: 1.0000 mg | INTRAMUSCULAR | Status: DC | PRN

## 2022-01-02 MED ORDER — AMOXICILLIN 500 MG PO CAPS: 500.0000 mg | ORAL_CAPSULE | Freq: Three times a day (TID) | ORAL | 0 refills | Status: DC

## 2022-01-02 MED ORDER — QUETIAPINE FUMARATE 200 MG PO TABS
200.0000 mg | ORAL_TABLET | Freq: Every morning | ORAL | Status: DC
  Administered 2022-01-03: 200 mg via ORAL
  Filled 2022-01-02: qty 1

## 2022-01-02 MED ORDER — AMOXICILLIN 500 MG PO CAPS
500.0000 mg | ORAL_CAPSULE | Freq: Once | ORAL | Status: AC
  Administered 2022-01-02: 500 mg via ORAL
  Filled 2022-01-02: qty 1

## 2022-01-02 MED ORDER — AMLODIPINE BESYLATE 5 MG PO TABS
10.0000 mg | ORAL_TABLET | Freq: Every day | ORAL | Status: DC
Start: 2022-01-02 — End: 2022-01-02

## 2022-01-02 MED ORDER — LORAZEPAM 1 MG PO TABS
1.0000 mg | ORAL_TABLET | Freq: Four times a day (QID) | ORAL | Status: DC | PRN
  Administered 2022-01-02: 1 mg via ORAL
  Filled 2022-01-02: qty 1

## 2022-01-02 MED ORDER — HYDROXYZINE HCL 25 MG PO TABS
25.0000 mg | ORAL_TABLET | Freq: Four times a day (QID) | ORAL | Status: DC | PRN
  Administered 2022-01-03 (×2): 25 mg via ORAL
  Filled 2022-01-02 (×3): qty 1

## 2022-01-02 MED ORDER — ADULT MULTIVITAMIN W/MINERALS CH
1.0000 | ORAL_TABLET | Freq: Every day | ORAL | Status: DC
  Administered 2022-01-02: 1 via ORAL
  Filled 2022-01-02: qty 1

## 2022-01-02 NOTE — ED Triage Notes (Signed)
Patient states sent from [facility] for medical clearance. Reports SI, hearing voices, and increased anxiety. Also c/o L upper dental pain and back pain.

## 2022-01-02 NOTE — BH Assessment (Signed)
Per Theda Belfast, AC at [facility], Pt has been accepted to [facility]. Notified Kerrie Pleasure, RN of acceptance.
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
## 2022-01-02 NOTE — ED Notes (Signed)
Pt A&O x 4, presents as transfer from [facility] with complaint of suicidal ideations, plan to cut throat. AVH noted.  Pt anxious and cooperative at present.  Monitoring for safety.

## 2022-01-02 NOTE — BH Assessment (Addendum)
@  1450, requested Amber, RN and Tonette Bihari, RN, to place the TTS machine in patient's room to complete his initial TTS assessment.   @1552 , clarified with patient's nurses that he does not need a TTS assessment at this time. Per chart review: The TTS clinician evaluated patient who was a walk in at [facility],  gave clinical report to Agustina Caroli, NP, who determined that Pt meets inpatient criteria.  Pt to be sent to [facility] for medical clearance.

## 2022-01-02 NOTE — ED Provider Notes (Signed)
[facility] DEPT Provider Note   CSN: [id number] Arrival date & time: 01/02/22  1316     History  Chief Complaint  Patient presents with   Medical Clearance    Lance Abbott is a 41 y.o. male.  HPI Patient presents on transfer from our behavioral health facility with staff concern for alcohol withdrawal.  Patient recalls that he has a history of alcohol abuse, and has recently been feeling suicidal, hearing voices.  He notes the death of his mother last year, ongoing cancer therapy for his father, and struggles with alcohol abuse.  He does have dental pain, but otherwise no physical complaints, no fever, no vomiting.    Home Medications Prior to Admission medications   Medication Sig Start Date End Date Taking? Authorizing Provider  amLODipine (NORVASC) 10 MG tablet Take 1 tablet (10 mg total) by mouth daily. 05/26/21   Chalmers Guest, NP  amLODipine (NORVASC) 10 MG tablet Take 1 tablet (10 mg total) by mouth daily. 07/20/21   Triplett, Tammy, PA-C  amoxicillin-clavulanate (AUGMENTIN) 875-125 MG tablet Take 1 tablet by mouth every 12 (twelve) hours. Patient not taking: No sig reported 05/25/21   Couture, Cortni S, PA-C  ARIPiprazole (ABILIFY) 15 MG tablet Take 1 tablet (15 mg total) by mouth daily. 05/05/21   Sherlon Handing, NP  ARIPiprazole ER (ABILIFY MAINTENA) 400 MG SRER injection Inject 2 mLs (400 mg total) into the muscle every 28 (twenty-eight) days. Ava received the injection on 04/30/2021. Next injection due July 22,2022 Patient not taking: Reported on 07/20/2021 05/28/21   Sherlon Handing, NP  atorvastatin (LIPITOR) 20 MG tablet Take 1 tablet (20 mg total) by mouth daily. 05/26/21   Chalmers Guest, NP  atorvastatin (LIPITOR) 20 MG tablet Take 1 tablet (20 mg total) by mouth daily. 07/20/21   Triplett, Tammy, PA-C  busPIRone (BUSPAR) 15 MG tablet Take 1 tablet (15 mg total) by mouth 3 (three) times daily. 05/26/21   Chalmers Guest, NP  busPIRone  (BUSPAR) 15 MG tablet Take 1 tablet (15 mg total) by mouth 3 (three) times daily. 07/20/21   Triplett, Tammy, PA-C  chlordiazePOXIDE (LIBRIUM) 25 MG capsule 50mg  PO TID x 1D, then 25-50mg  PO BID X 1D, then 25-50mg  PO QD X 1D 07/20/21   Triplett, Tammy, PA-C  gabapentin (NEURONTIN) 100 MG capsule Take 1 capsule (100 mg total) by mouth 3 (three) times daily. Patient not taking: No sig reported 05/26/21   Chalmers Guest, NP  QUEtiapine (SEROQUEL) 50 MG tablet Take 50 mg by mouth at bedtime.    [provider]  traZODone (DESYREL) 100 MG tablet Take 1 tablet (100 mg total) by mouth at bedtime as needed for sleep. Patient not taking: No sig reported 05/26/21   Chalmers Guest, NP      Allergies    Cogentin [benztropine], Zyprexa [olanzapine], Celexa [citalopram hydrobromide], Depakote [valproic acid], Effexor [venlafaxine], Geodon [ziprasidone hcl], Haldol [haloperidol], Hydrochlorothiazide, Lasix [furosemide], Lexapro [escitalopram], Lisinopril, Lithium, Thorazine [chlorpromazine], Topamax [topiramate], and Tramadol    Review of Systems   Review of Systems  Constitutional:        Per HPI, otherwise negative  HENT:         Per HPI, otherwise negative  Respiratory:         Per HPI, otherwise negative  Cardiovascular:        Per HPI, otherwise negative  Gastrointestinal:  Negative for vomiting.  Endocrine:       Negative aside  from HPI  Genitourinary:        Neg aside from HPI   Musculoskeletal:        Per HPI, otherwise negative  Skin: Negative.   Neurological:  Negative for syncope.  Psychiatric/Behavioral:  Positive for hallucinations and suicidal ideas.    Physical Exam Updated Vital Signs BP (!) 132/92    Pulse 100    Temp (!) 97.4 F (36.3 C)    Resp 18    SpO2 98%  Physical Exam Vitals and nursing note reviewed.  Constitutional:      General: He is not in acute distress.    Appearance: He is well-developed.  HENT:     Head: Normocephalic and atraumatic.      Mouth/Throat:   Eyes:     Conjunctiva/sclera: Conjunctivae normal.  Cardiovascular:     Rate and Rhythm: Normal rate and regular rhythm.  Pulmonary:     Effort: Pulmonary effort is normal. No respiratory distress.     Breath sounds: No stridor.  Abdominal:     General: There is no distension.  Skin:    General: Skin is warm and dry.  Neurological:     Mental Status: He is alert and oriented to person, place, and time.  Psychiatric:        Thought Content: Thought content includes suicidal ideation.        Cognition and Memory: He does not exhibit impaired recent memory or impaired remote memory.    ED Results / Procedures / Treatments   Labs (all labs ordered are listed, but only abnormal results are displayed) Labs Reviewed  RESP PANEL BY RT-PCR (FLU A&B, COVID) ARPGX2  COMPREHENSIVE METABOLIC PANEL  ETHANOL  CBC WITH DIFFERENTIAL/PLATELET    EKG None  Radiology No results found.  Procedures Procedures    Medications Ordered in ED Medications  amoxicillin (AMOXIL) capsule 500 mg (has no administration in time range)  nicotine (NICODERM CQ - dosed in mg/24 hours) patch 21 mg (has no administration in time range)    ED Course/ Medical Decision Making/ A&P                           Medical Decision Making Adult male with dental infection, but no evidence for bacteremia, sepsis, no airway compromise presents with suicidal ideation, hallucination from our behavioral health facility for assistance with medical stabilization.  Patient's physical exam otherwise reassuring aside from above, he received Ativan for anxiolysis, nicotine patch for tobacco dependency, and CIWA protocol was initiated with history of alcohol abuse.  Patient's labs reviewed, unremarkable, he was medically cleared for return to our behavioral health treatment pathway  Amount and/or Complexity of Data Reviewed Independent Historian:     Details: Notes from behavioral health Labs: ordered.  Decision-making details documented in ED Course.  Risk OTC drugs. Prescription drug management. Decision regarding hospitalization.  Critical Care Total time providing critical care: < 30 minutes  Patient's history of psychiatric disease and polysubstance abuse contributes as a social determinant of care Final Clinical Impression(s) / ED Diagnoses Final diagnoses:  Suicidal ideation  Dental infection    Rx / DC Orders ED Discharge Orders          Ordered    amoxicillin (AMOXIL) 500 MG capsule  3 times daily        01/02/22 1422              Carmin Muskrat, MD 01/02/22 1540

## 2022-01-02 NOTE — H&P (Signed)
Behavioral Health Medical Screening Exam  Lance Abbott is a 41 y.o. male with hx of mental illness & alcohol/cocaine use disorder. Patient came to the [facility] as a walk-in seeking mood stabilization/possible alcohol detoxification treatments. Lance Abbott is known in this [facility] from his previous admissions for mood stabilization treatments. He presents today complaining of worsening suicidal ideations with plan to overdose on cocaine. Reports increased alcohol consumption (wine, beer & liquor). Says he drank last 2 days ago. He says his worsening symptoms & increased alcohol consumption were triggered by the recent death of his mother last 2021-10-07, his father was recently diagnosis with throat cancer & patient's girlfriend being murdered in [place], [place] on 11-11-21. He says he recently was hospitalized in a psychiatric hospital in [place], [place]. Says he returned back to [facility], a month ago. Patient presents disheveled, sweaty & sluggish. He is endorsing auditory hallucinations telling him to cut out his vocal cord. Reports has not been on his mental health medications in 2 weeks. Patient at this times meets criteria for inpatient hospitalization. He will be transferred to the [facility] ED for medical evaluation & clearance. History of suicide attempts (multiple).  Total Time spent with patient: 30 minutes  Psychiatric Specialty Exam: Physical Exam Vitals reviewed.  HENT:     Head: Normocephalic.     Nose: Nose normal.     Mouth/Throat:     Pharynx: Oropharynx is clear.  Eyes:     Pupils: Pupils are equal, round, and reactive to light.  Cardiovascular:     Rate and Rhythm: Normal rate.     Pulses: Normal pulses.  Pulmonary:     Effort: Pulmonary effort is normal.  Genitourinary:    Comments: Deferred Musculoskeletal:        General: Normal range of motion.     Cervical back: Normal range of motion.  Skin:    General: Skin is warm.  Neurological:     General: No focal deficit present.      Mental Status: He is alert and oriented to person, place, and time.   Review of Systems  Constitutional:  Positive for chills and diaphoresis. Negative for fever.  HENT:  Negative for congestion, rhinorrhea, sneezing and sore throat.   Respiratory:  Negative for cough, choking, chest tightness, shortness of breath and wheezing.   Cardiovascular:  Negative for chest pain and palpitations.  Gastrointestinal:  Negative for diarrhea, nausea and vomiting.  Musculoskeletal:  Negative for arthralgias and myalgias.  Allergic/Immunologic:       Allergies:  Cogentin  Celexa  Depakote Effexor  Geodon  Haldol  Hydrochlorothiazide  Lasix  Lexapro  Lisinopril  Lithium   Thorazine  Topamax        Blood pressure 133/75, pulse 100, temperature 98.6 F (37 C), temperature source Oral, resp. rate 18, SpO2 100 %.There is no height or weight on file to calculate BMI. General Appearance: Disheveled  Eye Contact:  Fair  Speech:  Clear and Coherent and Normal Rate  Volume:  Normal  Mood:  Anxious and Depressed  Affect:  Congruent and Depressed  Thought Process:  Coherent and Goal Directed  Orientation:  Full (Time, Place, and Person)  Thought Content:  Rumination  Suicidal Thoughts:  Yes.  with intent/plan  Homicidal Thoughts:   Denies  Memory:  Immediate;   Fair Recent;   Fair Remote;   Fair  Judgement:  Impaired  Insight:  Shallow  Psychomotor Activity:  Decreased  Concentration: Concentration: Fair and Attention Span: Fair  Recall:  [place]: Good  Akathisia:  Negative  Handed:  Right  AIMS (if indicated):     Assets:  Communication Skills Desire for Improvement Sleep:    Musculoskeletal: Strength & Muscle Tone: within normal limits Gait & Station: normal Patient leans: N/A  Blood pressure 133/75, pulse 100, temperature 98.6 F (37 C), temperature source Oral, resp. rate 18, SpO2 100 %.  Recommendations: Inpatient  hospitalization for mood stabilization treatments & possible detoxification treatments. Based on my evaluation the patient appears to have an emergency medical condition for which I recommend the patient be transferred to the emergency department for further evaluation.  Lindell Spar, NP,pmhnp, fnp-bc. 01/02/2022, 1:01 PM
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
## 2022-01-03 ENCOUNTER — Encounter (HOSPITAL_COMMUNITY): Payer: Self-pay | Admitting: Urology

## 2022-01-03 LAB — POCT URINE DRUG SCREEN - MANUAL ENTRY (I-SCREEN)
POC Amphetamine UR: NOT DETECTED
POC Amphetamine UR: NOT DETECTED
POC Buprenorphine (BUP): NOT DETECTED
POC Buprenorphine (BUP): NOT DETECTED
POC Cocaine UR: POSITIVE — AB
POC Cocaine UR: NOT DETECTED
POC Marijuana UR: POSITIVE — AB
POC Marijuana UR: POSITIVE — AB
POC Methadone UR: NOT DETECTED
POC Methadone UR: NOT DETECTED
POC Methamphetamine UR: NOT DETECTED
POC Methamphetamine UR: NOT DETECTED
POC Morphine: NOT DETECTED
POC Morphine: NOT DETECTED
POC Oxazepam (BZO): POSITIVE — AB
POC Oxazepam (BZO): POSITIVE — AB
POC Oxycodone UR: NOT DETECTED
POC Oxycodone UR: NOT DETECTED
POC Secobarbital (BAR): NOT DETECTED
POC Secobarbital (BAR): NOT DETECTED

## 2022-01-03 MED ORDER — IBUPROFEN 600 MG PO TABS
600.0000 mg | ORAL_TABLET | ORAL | Status: DC | PRN
  Administered 2022-01-04: 600 mg via ORAL
  Filled 2022-01-03: qty 1

## 2022-01-03 MED ORDER — GABAPENTIN 300 MG PO CAPS
300.0000 mg | ORAL_CAPSULE | Freq: Three times a day (TID) | ORAL | Status: DC
  Administered 2022-01-03 – 2022-01-04 (×3): 300 mg via ORAL
  Filled 2022-01-03 (×2): qty 1
  Filled 2022-01-03 (×2): qty 21
  Filled 2022-01-03: qty 1

## 2022-01-03 MED ORDER — BUPROPION HCL ER (XL) 300 MG PO TB24
300.0000 mg | ORAL_TABLET | Freq: Every day | ORAL | Status: DC
  Administered 2022-01-04: 300 mg via ORAL
  Filled 2022-01-03: qty 1
  Filled 2022-01-03: qty 7

## 2022-01-03 MED ORDER — BUSPIRONE HCL 15 MG PO TABS
15.0000 mg | ORAL_TABLET | ORAL | Status: DC
  Administered 2022-01-03 – 2022-01-04 (×2): 15 mg via ORAL
  Filled 2022-01-03: qty 21
  Filled 2022-01-03 (×2): qty 1

## 2022-01-03 MED ORDER — NICOTINE 21 MG/24HR TD PT24
21.0000 mg | MEDICATED_PATCH | Freq: Every day | TRANSDERMAL | Status: DC
  Administered 2022-01-03 – 2022-01-04 (×2): 21 mg via TRANSDERMAL
  Filled 2022-01-03 (×2): qty 1

## 2022-01-03 MED ORDER — BUPROPION HCL ER (XL) 150 MG PO TB24
150.0000 mg | ORAL_TABLET | Freq: Once | ORAL | Status: AC
  Administered 2022-01-03: 150 mg via ORAL
  Filled 2022-01-03: qty 1

## 2022-01-03 NOTE — ED Notes (Signed)
Pt displaying med seeking behaviors for Ativan. Pt states, "Don't I get it every 4 hours? Everywhere I go, they just check my hands for tremors, see if I have my nose running and things like that. Pt CIWA scored 7. Informed pt that he have received medications this am, Buspar for anxiety and Seroquel for his mood (agitation). Pt asked 2 previous times for Ativan but didn't meet criteria for medication. Pt returned to his room, after eating a snack in dayroom in no acute distress. Will continue to monitor for safety.

## 2022-01-03 NOTE — ED Notes (Addendum)
Was attempting to admin Pt PRN Vistaril. As This nurse was opening the med room window Pt began to yell out, "You can stick it up your fucking ass bitch" with his hands up in the air. This nurse returned the Vistaril to the pyxis. Other staff members overheard Pt yelling. Security in attendance. Pt then began to talk to Dr. Jerrel Ivory requesting Ativan.

## 2022-01-03 NOTE — ED Notes (Signed)
Pt requested Nicotine patch while receiving morning meds. Informed provider via secure chat of request.

## 2022-01-03 NOTE — ED Notes (Signed)
Pt came to nurses station and complained to nurse that his teeth is aching and requested for tylenol. Pt stated the pain level is 8 on an scale of 0-10.

## 2022-01-03 NOTE — ED Provider Notes (Signed)
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 and atraumatic.  Eyes:     Extraocular Movements: Extraocular movements intact.  Pulmonary:     Effort: Pulmonary effort is normal.  Neurological:     General: No focal deficit present.     Mental Status: He is alert and oriented to person, place, and time.  Psychiatric:        Attention and Perception: Attention and perception  normal.        Speech: Speech normal.        Behavior: Behavior normal. Behavior is cooperative.        Thought Content: Thought content normal.   Review of Systems  Constitutional:  Negative for chills and fever.  HENT:  Negative for hearing loss.   Eyes:  Negative for discharge and redness.  Respiratory:  Negative for cough.   Cardiovascular:  Negative for chest pain.  Gastrointestinal:  Negative for abdominal pain.  Musculoskeletal:  Positive  for back pain and myalgias.       Reports chronic back pain  Neurological:  Negative for headaches.  Psychiatric/Behavioral:  Positive for depression, hallucinations, substance abuse and suicidal ideas. The patient is nervous/anxious.        Passive sI  Blood pressure (!) 119/91, pulse 96, temperature 97.9 F (36.6 C), temperature source Oral, resp. rate 18, SpO2 98 %. There is no height or weight on file to calculate BMI.  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 Patient was transferred to [facility] for medical clearance and then to the [facility] the same day after being medically cleared.  UDS+BZD, cocaine, and marijuana. Etoh 82.  Patient seen and chart reviewed. Most recent 29 of 7. Patient noted to be irritable and agitated upon arrival to the [facility] with concerns for medication seeking per documentation.  On interview today patient reports ongoing passive SI, depression, as well as auditory hallucinations.  Patient agreeable to the following medication regimen adjustments-increasing Wellbutrin to 300 mg daily, starting gabapentin 300 mg  3 times daily, stopping seroquel 200 mg daily; patient requests adjustment of BuSpar 15 mg 3 times daily timing and to discontinue multivitamin and thiamine and to potentially restart abilify LAI.  Schizoaffective d/o bipolar type  R/o SIMD/SIPD -Continue Abilify 15mg  daily --consider initiating abilify maintena 400 mg IM LAI  -continue Trazodone 100mg  po qhs PRN insomnia -Continue Buspar 15mg  tid for anxiety -continue seroquel 400 mg qhs nightly--discontinue seroquel 200 mg daily as patient is on multiple antipsychotics. Will discuss further tapering seroquel during admission as per chart review patient has sx well controlled with one antipsychotic in the past   Polysubstance abuse -etoh 82; UDS+bzd, thc, cocaine -patient would benefit from rehabl;however, declines at this time  Alcohol use d/o -etoh 82 -patient denies h/o seizures, DTs -CIWA protocol -most recent CIWA score 7--if patient begins to have elevated CIWA scores can consider scheduled taper if indicated -discontinued thiamine and multivitamin per request -gabapentin 300 mg TID for etoh use, cravings, withdrawal sx  Dispo: ongoing. SW assisting.   Ival Bible, MD 01/03/2022 12:09 PM

## 2022-01-03 NOTE — Clinical Social Work Psych Note (Signed)
LCSW Initial Note  LCSW met with Donnelle for introduction and to begin discussions regarding treatment and potential discharge planning.   Won presented with a blunt affect, congruent mood. Juvon continued to endorse suicidal ideation, however he did not have a plan. He denied having any HI, VH or AH at this time.   Braedyn shared that he came to the [facility], seeking assistance for mood stabilization and substance abuse treatment. During his initial assessment, Algie endorsed suicidal ideations with a plan to cut his throat.   Ashaad shared that his mother passed away back in 09/28/21 from cancer, his father was recently diagnosed with cancer, and his girlfriend was murdered on November 11, 2021. Kayan reports experiencing worsening depressive symptoms, which he has been "self medicating" with alcohol consumption.   Brien endorsed drinking (4) 40oz bottles of beer, in addition to 1 bottle of wine on a daily basis. He also reports having a history of cocaine use, Sascha reports his last use was back in 2021-09-28, when he attempted suicide by cocaine overdose.   Emilian shared that he currently lives alone in a three bedroom trailer in [place], [place]. He reports his father is his only support, he denied having any additional supports.   Amaree reports his main goal while on the [facility] is to have his mood "stabilized" and to "Get back on my meds". Jamale denied any residential or outpatient substance abuse treatment services at this time. LCSW will continue to assess to determine appropriate follow up resources/services.   LCSW will continue to follow.   Radonna Ricker, MSW, LCSW Clinical Education officer, museum ([place]

## 2022-01-03 NOTE — ED Notes (Signed)
Pt was sleeping and explained that it was time for group and he stated that he needed some rest, Wellness Monday handout was given.

## 2022-01-03 NOTE — ED Notes (Signed)
Pt became irate calling nurse at med window out of her name. Pt demanding for Ativan. Pt stated, "Just give me my Ativan you fucking bitch." Renae, LPN offfered pt Vistaril since he didn't meet the parameters (CIWA) for Ativan. Pt told nurse to "stick that pill up her fucking ass". Pt was belligerent. Security came to unit after hearing the commotion. Dr. Vicente Serene present. Pt asked Dr. Vicente Serene if he would give him his Ativan. Dr. Vicente Serene proceeded to educate pt on indication for Ativan and denied pt's request. Pt requested to discharge. Pt stated, "I'm not suicidal, I don't want to kill myself, I'm not hearing any voices. Can I leave?" Dr. Vicente Serene left to consult with Dr. Bronwen Betters about pt's behavior and his request. Pt went to room for time out. Awaiting orders.

## 2022-01-03 NOTE — ED Notes (Signed)
Pt is in the bed sleeping. Respirations are even and unlabored. No acute distress noted. Will continue to monitor for safety. 

## 2022-01-03 NOTE — ED Notes (Signed)
Pt had a sneak. ?
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
## 2022-01-03 NOTE — ED Notes (Addendum)
Patient denies [place].  Patient is cooperative and interacts well with staff. Respiratory is even and unlabored. No distress noted. Patient awake and alert sitting in the dinning room at present. Will continue to monitor for safety.

## 2022-01-03 NOTE — ED Notes (Signed)
Breakfast provided.

## 2022-01-03 NOTE — ED Notes (Signed)
Pt sleeping at present, no distress noted.  Monitoring for safety. 

## 2022-01-03 NOTE — ED Notes (Signed)
Pt ate two meals for dinner.

## 2022-01-03 NOTE — Clinical Social Work Psych Note (Signed)
Crisis Management   Monday - Crisis Management (Identifying Triggers/Coping Skills)  Date: 01/03/22  Type of Therapy/Therapeutic Modalities: Group, CBT, Motivational Interviewing, Supportive, Psych-Educational  Participation Level: Active  Objectives: To assist patients in identifying triggers and how their effects contribute to their mental health and overall well-being. Facilitators will guide discussions around identifying coping mechanisms that are unique and effective for each group member.   Therapeutic Goals: Patient will identify triggers and the emotions associated with them to evaluate their    behavioral responses.  Patient will participate in group discussion discussing why they believe these triggers cause certain responses.  Patient will explore healthy coping mechanisms.  Patient participate in group discussion sharing how they plan to implement these    coping strategies in their day-to-day life and overall road to recovery.   Summary of Patients Progress:  Lance Abbott participated in the Crisis Management workshop. He reviewed and completed worksheets that address the objectives and therapeutic goals listed above. Lance Abbott was able to identify specific triggers and positive coping skills to manage those triggers. Lance Abbott also reviewed materials on how to set and maintain boundaries.

## 2022-01-03 NOTE — ED Notes (Signed)
Received pt from [facility], irritable upon awakening. Informed pt that he was going to be moved to [facility]. Escorted pt to locker room to retrieve belongings to carry on the unit and perform a skin assessment. Upon entering room, pt was asked to remove his shirt. Pt immediately threw shirt to floor. RN asked pt to hand shirt to [facility] for search, pt immediately picked up shirt, turned to wall and placed hand in pocket of shirt, removed object from his shirt pocket and placed in his mouth and swallowed. When asked what it was he placed in his mouth, he responded, "candy". Security called. Pt dropped a cut straw (approx. 2") from his hand. A thorough skin search was performed. NP notified of event and provider came to assess pt. After approx. 4 min, provider returned to search room and informed staff after speaking with her director, Dr. Lucianne Muss, pt could be transported to [facility] for continued treatment. Pt smirked. Pt was escorted to unit to complete RN assessment. Pt continue to endorse SI no plan, endorse AVH. When asked what are the voices telling him, pt states, "that I would be better off dead". Pt asked staff about his cellphones (2) in locker. Pt states, "If they aren't in there, I'm giong to kill someone, literally". Pt was redirected by staff. Pt stated, "I'm serious". MD notified of pt behaviors upon entering unit. Pt escorted to his room and asked what time was medication pass. Informed pt that medications were scheduled for 1000. Pt laid on bed to rest and await breakfast. Safety precautions initiated.

## 2022-01-03 NOTE — ED Notes (Signed)
No care by this nurse was initiated.  Pt was transferred to [facility] at start of shift.

## 2022-01-03 NOTE — ED Notes (Signed)
Pt talking with RN at nurses station. Pt states, "I'm gonna do whatever I have to do this time. I want off this ETOH. I'm here voluntary and I don't want to get upset and leave. The only thing that will make me angry is if I need my Ativan and y'all hold it from me. That Ativan help me a lot". Provided education on use, indication, SE of Ativan and reassured pt that staff is here to help pt. Verbalized understanding. Pt went to take shower. No acute distress noted. Safety maintained.

## 2022-01-04 MED ORDER — BUSPIRONE HCL 15 MG PO TABS: 15.0000 mg | ORAL_TABLET | Freq: Three times a day (TID) | ORAL | 0 refills | Status: DC

## 2022-01-04 MED ORDER — NICOTINE 21 MG/24HR TD PT24: 21.0000 mg | MEDICATED_PATCH | Freq: Every day | TRANSDERMAL | 0 refills | Status: DC

## 2022-01-04 MED ORDER — ATORVASTATIN CALCIUM 20 MG PO TABS: 20.0000 mg | ORAL_TABLET | Freq: Every day | ORAL | 0 refills | Status: DC

## 2022-01-04 MED ORDER — AMLODIPINE BESYLATE 10 MG PO TABS: 10.0000 mg | ORAL_TABLET | Freq: Every day | ORAL | 0 refills | Status: DC

## 2022-01-04 MED ORDER — BUPROPION HCL ER (XL) 300 MG PO TB24: 300.0000 mg | ORAL_TABLET | Freq: Every day | ORAL | 0 refills | Status: DC

## 2022-01-04 MED ORDER — QUETIAPINE FUMARATE 400 MG PO TABS: 400.0000 mg | ORAL_TABLET | Freq: Every day | ORAL | 0 refills | Status: DC

## 2022-01-04 MED ORDER — TRAZODONE HCL 100 MG PO TABS: 100.0000 mg | ORAL_TABLET | Freq: Every evening | ORAL | 0 refills | Status: DC | PRN

## 2022-01-04 MED ORDER — ARIPIPRAZOLE 15 MG PO TABS: 15.0000 mg | ORAL_TABLET | Freq: Every day | ORAL | 0 refills | Status: DC

## 2022-01-04 MED ORDER — GABAPENTIN 300 MG PO CAPS
300.0000 mg | ORAL_CAPSULE | Freq: Three times a day (TID) | ORAL | 0 refills | Status: DC
Start: 2022-01-04 — End: 2022-02-02

## 2022-01-04 NOTE — Progress Notes (Signed)
Pt is awake, alert and oriented. Pt complained of tooth pain and requested Ibuprofen. No signs of acute distress noted. PRN and scheduled meds administered with no incident. Pt denies current SI/HI/AVH. Staff will monitor for pt's safety.

## 2022-01-04 NOTE — ED Notes (Signed)
Pt came to nurse and complained that he is feeling nauseated.

## 2022-01-04 NOTE — Progress Notes (Addendum)
Extensive environmental checks was done in pt's room. Pt was resistant to room check after a while. No contraband found at this time. Pt refused to pull out his pants pocket. Pt stated "you will have to hold me down for that one." MD notified in person. Will continue to monitor.

## 2022-01-04 NOTE — ED Notes (Signed)
Pt stated he is not feeling nauseated anymore  and did not want the Zofran.

## 2022-01-04 NOTE — ED Notes (Signed)
Pt is in the bed sleeping. Respirations are even and unlabored. No acute distress noted. Will continue to monitor for safety. 

## 2022-01-04 NOTE — ED Notes (Signed)
RN went into pt room and observed him taking a medication out of his mouth and place it under his sheet. When RN entered room and questioned pt about the medication, pt immediately placed his hand under the sheet and threw the med in his mouth. RN proceeded to MD office to inform about cheeking behaviors. Pt stated, "I told the nurse I didn't want to take some of the meds and she forced me to take them". RN informed pt that he always can refuse a med but it appeared to be a capsule observed by RN and the only capsule pt took this am was Gabapentin. Pt request to speak with provider so he can discharge. MD notified. Will continue to monitor for safety.

## 2022-01-04 NOTE — Discharge Instructions (Signed)

## 2022-01-04 NOTE — ED Notes (Signed)
Pt sleeping in no acute distress. RR even and unlabored. Safety maintained. 

## 2022-01-04 NOTE — ED Notes (Signed)
Pt discharged to self care. Verbalized understanding of all discharge instructions reviewed on AVS. Received 7-day supply of medications from pharmacy and RX's. Received all belongings intact from locker #29. Pt escorted to lobby via staff. Safety maintained.
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
## 2022-01-04 NOTE — ED Notes (Signed)
RN performing safety check in pt's room. Pt compliant with search

## 2022-01-12 NOTE — ED Notes (Signed)
Pt called [facility] ED requesting to have prescription sent to a pharmacy where there was no co pay. This RN attempted to return patients call with no answer. Pt was seen at [facility] for SI, sent to [facility] ED on 01/03/2022 for med clearance. Per chart, he was dc'd home from [facility] with a 7 day supply and several refills of his prescribed meds.

## 2022-01-28 ENCOUNTER — Emergency Department (HOSPITAL_COMMUNITY): Payer: Self-pay

## 2022-01-28 ENCOUNTER — Other Ambulatory Visit: Payer: Self-pay

## 2022-01-28 ENCOUNTER — Encounter (HOSPITAL_COMMUNITY): Payer: Self-pay | Admitting: Emergency Medicine

## 2022-01-28 ENCOUNTER — Inpatient Hospital Stay (HOSPITAL_COMMUNITY)
Admission: EM | Admit: 2022-01-28 | Discharge: 2022-02-02 | DRG: 683 | Disposition: A | Discharge status: Home / Self Care | Payer: Self-pay | Attending: Family Medicine | Admitting: Family Medicine

## 2022-01-28 DIAGNOSIS — Z6828 Body mass index (BMI) 28.0-28.9, adult: Secondary | ICD-10-CM

## 2022-01-28 DIAGNOSIS — R778 Other specified abnormalities of plasma proteins: Secondary | ICD-10-CM

## 2022-01-28 DIAGNOSIS — G8929 Other chronic pain: Secondary | ICD-10-CM | POA: Diagnosis present

## 2022-01-28 DIAGNOSIS — D649 Anemia, unspecified: Secondary | ICD-10-CM | POA: Diagnosis not present

## 2022-01-28 DIAGNOSIS — E7800 Pure hypercholesterolemia, unspecified: Secondary | ICD-10-CM | POA: Diagnosis present

## 2022-01-28 DIAGNOSIS — F1910 Other psychoactive substance abuse, uncomplicated: Secondary | ICD-10-CM | POA: Diagnosis present

## 2022-01-28 DIAGNOSIS — F23 Brief psychotic disorder: Secondary | ICD-10-CM

## 2022-01-28 DIAGNOSIS — F29 Unspecified psychosis not due to a substance or known physiological condition: Secondary | ICD-10-CM | POA: Diagnosis present

## 2022-01-28 DIAGNOSIS — I10 Essential (primary) hypertension: Secondary | ICD-10-CM | POA: Diagnosis present

## 2022-01-28 DIAGNOSIS — F17210 Nicotine dependence, cigarettes, uncomplicated: Secondary | ICD-10-CM | POA: Diagnosis present

## 2022-01-28 DIAGNOSIS — E663 Overweight: Secondary | ICD-10-CM | POA: Diagnosis present

## 2022-01-28 DIAGNOSIS — R000 Tachycardia, unspecified: Secondary | ICD-10-CM | POA: Diagnosis present

## 2022-01-28 DIAGNOSIS — E876 Hypokalemia: Secondary | ICD-10-CM | POA: Diagnosis not present

## 2022-01-28 DIAGNOSIS — Z781 Physical restraint status: Secondary | ICD-10-CM

## 2022-01-28 DIAGNOSIS — F22 Delusional disorders: Principal | ICD-10-CM

## 2022-01-28 DIAGNOSIS — F302 Manic episode, severe with psychotic symptoms: Secondary | ICD-10-CM | POA: Diagnosis present

## 2022-01-28 DIAGNOSIS — I248 Other forms of acute ischemic heart disease: Secondary | ICD-10-CM | POA: Diagnosis present

## 2022-01-28 DIAGNOSIS — Z20822 Contact with and (suspected) exposure to covid-19: Secondary | ICD-10-CM | POA: Diagnosis present

## 2022-01-28 DIAGNOSIS — Z888 Allergy status to other drugs, medicaments and biological substances status: Secondary | ICD-10-CM

## 2022-01-28 DIAGNOSIS — F419 Anxiety disorder, unspecified: Secondary | ICD-10-CM | POA: Diagnosis present

## 2022-01-28 DIAGNOSIS — J45909 Unspecified asthma, uncomplicated: Secondary | ICD-10-CM | POA: Diagnosis present

## 2022-01-28 DIAGNOSIS — F1011 Alcohol abuse, in remission: Secondary | ICD-10-CM | POA: Diagnosis present

## 2022-01-28 DIAGNOSIS — R45851 Suicidal ideations: Secondary | ICD-10-CM | POA: Diagnosis present

## 2022-01-28 DIAGNOSIS — N179 Acute kidney failure, unspecified: Principal | ICD-10-CM | POA: Diagnosis present

## 2022-01-28 DIAGNOSIS — Z79899 Other long term (current) drug therapy: Secondary | ICD-10-CM

## 2022-01-28 DIAGNOSIS — D72829 Elevated white blood cell count, unspecified: Secondary | ICD-10-CM

## 2022-01-28 DIAGNOSIS — F1021 Alcohol dependence, in remission: Secondary | ICD-10-CM

## 2022-01-28 LAB — CBC WITH DIFFERENTIAL/PLATELET
Abs Immature Granulocytes: 0.1 10*3/uL — ABNORMAL HIGH (ref 0.00–0.07)
Basophils Absolute: 0.1 10*3/uL (ref 0.0–0.1)
Basophils Relative: 0 %
Eosinophils Absolute: 0 10*3/uL (ref 0.0–0.5)
Eosinophils Relative: 0 %
HCT: 42.3 % (ref 39.0–52.0)
Hemoglobin: 14.8 g/dL (ref 13.0–17.0)
Immature Granulocytes: 1 %
Lymphocytes Relative: 4 %
Lymphs Abs: 0.8 10*3/uL (ref 0.7–4.0)
MCH: 30.5 pg (ref 26.0–34.0)
MCHC: 35 g/dL (ref 30.0–36.0)
MCV: 87.2 fL (ref 80.0–100.0)
Monocytes Absolute: 1.4 10*3/uL — ABNORMAL HIGH (ref 0.1–1.0)
Monocytes Relative: 7 %
Neutro Abs: 17 10*3/uL — ABNORMAL HIGH (ref 1.7–7.7)
Neutrophils Relative %: 88 %
Platelets: 298 10*3/uL (ref 150–400)
RBC: 4.85 MIL/uL (ref 4.22–5.81)
RDW: 13.1 % (ref 11.5–15.5)
WBC: 19.3 10*3/uL — ABNORMAL HIGH (ref 4.0–10.5)
nRBC: 0 % (ref 0.0–0.2)

## 2022-01-28 LAB — COMPREHENSIVE METABOLIC PANEL
ALT: 27 U/L (ref 0–44)
AST: 39 U/L (ref 15–41)
Albumin: 5.3 g/dL — ABNORMAL HIGH (ref 3.5–5.0)
Alkaline Phosphatase: 80 U/L (ref 38–126)
Anion gap: 14 (ref 5–15)
BUN: 22 mg/dL — ABNORMAL HIGH (ref 6–20)
CO2: 22 mmol/L (ref 22–32)
Calcium: 10.2 mg/dL (ref 8.9–10.3)
Chloride: 105 mmol/L (ref 98–111)
Creatinine, Ser: 2.1 mg/dL — ABNORMAL HIGH (ref 0.61–1.24)
GFR, Estimated: 40 mL/min — ABNORMAL LOW (ref >60)
Glucose, Bld: 92 mg/dL (ref 70–99)
Potassium: 3.2 mmol/L — ABNORMAL LOW (ref 3.5–5.1)
Sodium: 141 mmol/L (ref 135–145)
Total Bilirubin: 1.1 mg/dL (ref 0.3–1.2)
Total Protein: 8.1 g/dL (ref 6.5–8.1)

## 2022-01-28 LAB — RESP PANEL BY RT-PCR (FLU A&B, COVID) ARPGX2
Influenza A by PCR: NEGATIVE
Influenza B by PCR: NEGATIVE
SARS Coronavirus 2 by RT PCR: NEGATIVE

## 2022-01-28 LAB — TROPONIN I (HIGH SENSITIVITY)
Troponin I (High Sensitivity): 101 ng/L (ref <18)
Troponin I (High Sensitivity): 94 ng/L — ABNORMAL HIGH (ref <18)

## 2022-01-28 LAB — SALICYLATE LEVEL: Salicylate Lvl: <7 mg/dL — ABNORMAL LOW (ref 7.0–30.0)

## 2022-01-28 LAB — ETHANOL: Alcohol, Ethyl (B): <10 mg/dL (ref <10)

## 2022-01-28 MED ORDER — TRAZODONE HCL 50 MG PO TABS
100.0000 mg | ORAL_TABLET | Freq: Every evening | ORAL | Status: DC | PRN
  Administered 2022-01-28: 100 mg via ORAL
  Filled 2022-01-28: qty 2

## 2022-01-28 MED ORDER — ENOXAPARIN SODIUM 40 MG/0.4ML IJ SOSY
40.0000 mg | PREFILLED_SYRINGE | INTRAMUSCULAR | Status: DC
  Administered 2022-01-29 – 2022-01-31 (×3): 40 mg via SUBCUTANEOUS
  Filled 2022-01-28 (×4): qty 0.4

## 2022-01-28 MED ORDER — ADULT MULTIVITAMIN W/MINERALS CH
1.0000 | ORAL_TABLET | Freq: Every day | ORAL | Status: DC
  Administered 2022-01-29 – 2022-01-31 (×3): 1 via ORAL
  Filled 2022-01-28 (×5): qty 1

## 2022-01-28 MED ORDER — OXYCODONE HCL 5 MG PO TABS
5.0000 mg | ORAL_TABLET | ORAL | Status: DC | PRN
  Administered 2022-01-29 – 2022-02-02 (×11): 5 mg via ORAL
  Filled 2022-01-28 (×11): qty 1

## 2022-01-28 MED ORDER — FOLIC ACID 1 MG PO TABS
1.0000 mg | ORAL_TABLET | Freq: Every day | ORAL | Status: DC
  Administered 2022-01-29 – 2022-02-01 (×4): 1 mg via ORAL
  Filled 2022-01-28 (×4): qty 1

## 2022-01-28 MED ORDER — SODIUM CHLORIDE 0.9 % IV BOLUS
1000.0000 mL | Freq: Once | INTRAVENOUS | Status: AC
  Administered 2022-01-28: 1000 mL via INTRAVENOUS

## 2022-01-28 MED ORDER — ZIPRASIDONE MESYLATE 20 MG IM SOLR: 20.0000 mg | Freq: Once | INTRAMUSCULAR | Status: AC

## 2022-01-28 MED ORDER — GABAPENTIN 300 MG PO CAPS
300.0000 mg | ORAL_CAPSULE | Freq: Three times a day (TID) | ORAL | Status: DC
  Administered 2022-01-28 – 2022-01-31 (×9): 300 mg via ORAL
  Filled 2022-01-28 (×9): qty 1

## 2022-01-28 MED ORDER — CYCLOBENZAPRINE HCL 10 MG PO TABS
10.0000 mg | ORAL_TABLET | Freq: Once | ORAL | Status: AC
  Administered 2022-01-28: 10 mg via ORAL
  Filled 2022-01-28: qty 1

## 2022-01-28 MED ORDER — ATORVASTATIN CALCIUM 20 MG PO TABS
20.0000 mg | ORAL_TABLET | Freq: Every day | ORAL | Status: DC
  Administered 2022-01-29 – 2022-02-01 (×4): 20 mg via ORAL
  Filled 2022-01-28 (×2): qty 1
  Filled 2022-01-28: qty 2
  Filled 2022-01-28: qty 1

## 2022-01-28 MED ORDER — STERILE WATER FOR INJECTION IJ SOLN
INTRAMUSCULAR | Status: AC
  Administered 2022-01-28: 1.2 mL
  Filled 2022-01-28: qty 10

## 2022-01-28 MED ORDER — LORAZEPAM 2 MG/ML IJ SOLN
1.0000 mg | Freq: Once | INTRAMUSCULAR | Status: AC
  Administered 2022-01-28: 1 mg via INTRAVENOUS

## 2022-01-28 MED ORDER — LORAZEPAM 2 MG/ML IJ SOLN
1.0000 mg | INTRAMUSCULAR | Status: AC | PRN
  Administered 2022-01-28: 3 mg via INTRAVENOUS
  Administered 2022-01-29 – 2022-01-30 (×4): 2 mg via INTRAVENOUS
  Administered 2022-01-30 (×2): 3 mg via INTRAVENOUS
  Administered 2022-01-31 (×3): 4 mg via INTRAVENOUS
  Filled 2022-01-28 (×2): qty 2
  Filled 2022-01-28 (×2): qty 1
  Filled 2022-01-28 (×5): qty 2
  Filled 2022-01-28 (×2): qty 1

## 2022-01-28 MED ORDER — ZIPRASIDONE MESYLATE 20 MG IM SOLR
20.0000 mg | Freq: Once | INTRAMUSCULAR | Status: DC
Start: 2022-01-28 — End: 2022-01-28

## 2022-01-28 MED ORDER — NICOTINE 21 MG/24HR TD PT24
21.0000 mg | MEDICATED_PATCH | Freq: Every day | TRANSDERMAL | Status: DC
  Administered 2022-01-29 – 2022-02-01 (×4): 21 mg via TRANSDERMAL
  Filled 2022-01-28 (×4): qty 1

## 2022-01-28 MED ORDER — THIAMINE HCL 100 MG PO TABS
100.0000 mg | ORAL_TABLET | Freq: Every day | ORAL | Status: DC
  Administered 2022-01-29 – 2022-02-01 (×4): 100 mg via ORAL
  Filled 2022-01-28 (×4): qty 1

## 2022-01-28 MED ORDER — LABETALOL HCL 5 MG/ML IV SOLN: 20.0000 mg | INTRAVENOUS | Status: DC | PRN

## 2022-01-28 MED ORDER — LORAZEPAM 2 MG/ML IJ SOLN
1.0000 mg | Freq: Once | INTRAMUSCULAR | Status: DC
  Filled 2022-01-28: qty 1

## 2022-01-28 MED ORDER — ZIPRASIDONE MESYLATE 20 MG IM SOLR
INTRAMUSCULAR | Status: AC
  Administered 2022-01-28: 20 mg via INTRAMUSCULAR
  Filled 2022-01-28: qty 20

## 2022-01-28 MED ORDER — MORPHINE SULFATE (PF) 4 MG/ML IV SOLN
4.0000 mg | Freq: Once | INTRAVENOUS | Status: AC
  Administered 2022-01-28: 4 mg via INTRAVENOUS
  Filled 2022-01-28: qty 1

## 2022-01-28 MED ORDER — GABAPENTIN 300 MG PO CAPS
300.0000 mg | ORAL_CAPSULE | Freq: Once | ORAL | Status: AC
  Administered 2022-01-28: 300 mg via ORAL
  Filled 2022-01-28: qty 1

## 2022-01-28 MED ORDER — ONDANSETRON HCL 4 MG/2ML IJ SOLN: 4.0000 mg | Freq: Four times a day (QID) | INTRAMUSCULAR | Status: DC | PRN

## 2022-01-28 MED ORDER — POTASSIUM CHLORIDE CRYS ER 20 MEQ PO TBCR
40.0000 meq | EXTENDED_RELEASE_TABLET | Freq: Two times a day (BID) | ORAL | Status: DC
  Administered 2022-01-28 – 2022-02-01 (×9): 40 meq via ORAL
  Filled 2022-01-28 (×8): qty 2

## 2022-01-28 MED ORDER — THIAMINE HCL 100 MG/ML IJ SOLN: 100.0000 mg | Freq: Every day | INTRAMUSCULAR | Status: DC

## 2022-01-28 MED ORDER — MORPHINE SULFATE (PF) 2 MG/ML IV SOLN
2.0000 mg | Freq: Once | INTRAVENOUS | Status: AC
  Administered 2022-01-28: 2 mg via INTRAVENOUS
  Filled 2022-01-28: qty 1

## 2022-01-28 MED ORDER — LORAZEPAM 2 MG/ML IJ SOLN
INTRAMUSCULAR | Status: AC
  Administered 2022-01-28: 2 mg via INTRAMUSCULAR
  Filled 2022-01-28: qty 1

## 2022-01-28 MED ORDER — QUETIAPINE FUMARATE 100 MG PO TABS
400.0000 mg | ORAL_TABLET | Freq: Every day | ORAL | Status: DC
  Administered 2022-01-28 – 2022-02-01 (×5): 400 mg via ORAL
  Filled 2022-01-28 (×5): qty 4

## 2022-01-28 MED ORDER — ONDANSETRON HCL 4 MG PO TABS
4.0000 mg | ORAL_TABLET | Freq: Four times a day (QID) | ORAL | Status: DC | PRN
  Filled 2022-01-28: qty 1

## 2022-01-28 MED ORDER — BUSPIRONE HCL 5 MG PO TABS
15.0000 mg | ORAL_TABLET | Freq: Three times a day (TID) | ORAL | Status: DC
  Administered 2022-01-28 – 2022-01-31 (×9): 15 mg via ORAL
  Filled 2022-01-28 (×9): qty 3

## 2022-01-28 MED ORDER — LACTATED RINGERS IV SOLN: INTRAVENOUS | Status: DC

## 2022-01-28 MED ORDER — LORAZEPAM 1 MG PO TABS
1.0000 mg | ORAL_TABLET | ORAL | Status: AC | PRN
  Administered 2022-01-29: 3 mg via ORAL
  Administered 2022-01-31: 2 mg via ORAL
  Filled 2022-01-28: qty 2
  Filled 2022-01-28: qty 3
  Filled 2022-01-28: qty 2

## 2022-01-28 MED ORDER — AMLODIPINE BESYLATE 5 MG PO TABS
10.0000 mg | ORAL_TABLET | Freq: Every day | ORAL | Status: DC
  Administered 2022-01-30 – 2022-02-01 (×3): 10 mg via ORAL
  Filled 2022-01-28 (×4): qty 2

## 2022-01-28 MED ORDER — LORAZEPAM 2 MG/ML IJ SOLN
1.0000 mg | Freq: Once | INTRAMUSCULAR | Status: AC
  Administered 2022-01-28: 1 mg via INTRAMUSCULAR
  Filled 2022-01-28: qty 1

## 2022-01-28 NOTE — Progress Notes (Signed)
ON-CALL CARDIOLOGY ?01/28/22 ? ?Patient's name: Lance Abbott.   ?MRN: [id number].    ?DOB: 10/16/1981 ?Primary care provider: Pcp, No. ?Primary cardiologist: NA ? ?Interaction regarding this patient's care today: ?I was called by Berle Mull, PA-C in the emergency room department to discuss this patient's plan of care from a cardiology standpoint. ? ?Appearance is a 41 year old Caucasian gentleman who presents to the ED via EMS due to polysubstance abuse.  Lance Abbott tells me that he used cocaine yesterday and based on electronic medical records he also ingested more than the recommended amount of Adderall and consumes marijuana regularly. ? ?High sensitive troponins are slightly elevated as noted below. ? ?EKG 01/28/2022 at 1243 notes sinus tachycardia, normal axis, prolonged QT interval, without underlying injury pattern. ?EKG 01/28/2022 at 1216 uninterpretable due to multiple baseline EKG artifact. ? ?The question regarding his care was if the high sensitive troponins are concerning for ACS. ? ?LABORATORY DATA: ? ?  Latest Ref Rng & Units 01/28/2022  ?  1:47 PM 07/20/2021  ? 10:15 AM 05/25/2021  ?  5:43 PM  ?CBC  ?WBC 4.0 - 10.5 K/uL 19.3   16.5   13.9    ?Hemoglobin 13.0 - 17.0 g/dL 97.0   26.3   78.5    ?Hematocrit 39.0 - 52.0 % 42.3   47.0   46.4    ?Platelets 150 - 400 K/uL 298   316   308    ? ? ? ?  Latest Ref Rng & Units 01/28/2022  ?  1:47 PM 01/02/2022  ?  1:27 PM 07/20/2021  ? 10:15 AM  ?CMP  ?Glucose 70 - 99 mg/dL 92   89   885    ?BUN 6 - 20 mg/dL 22   16   23     ?Creatinine 0.61 - 1.24 mg/dL   0.27   7.41    ?Sodium 135 - 145 mmol/L 141   136   135    ?Potassium 3.5 - 5.1 mmol/L 3.2   4.3   3.8    ?Chloride 98 - 111 mmol/L 105   103   99    ?CO2 22 - 32 mmol/L 22   23   24     ?Calcium 8.9 - 10.3 mg/dL 2.87   9.1   9.8    ?Total Protein 6.5 - 8.1 g/dL 8.1   7.8   8.2    ?Total Bilirubin 0.3 - 1.2 mg/dL 1.1   0.7   0.6    ?Alkaline Phos 38 - 126 U/L 80   85   84    ?AST 15 - 41 U/L 39   36   35     ?ALT 0 - 44 U/L 27   38   38    ? ? ?Lipid Panel  ?   ?Component Value Date/Time  ? CHOL 236 (H) 04/29/2021 2200  ? TRIG 339 (H) 04/29/2021 2200  ? HDL 63 04/29/2021 2200  ? CHOLHDL 3.7 04/29/2021 2200  ? VLDL 68 (H) 04/29/2021 2200  ? LDLCALC 105 (H) 04/29/2021 2200  ? LDLDIRECT 126.2 (H) 02/18/2021 0031  ? ? ?No components found for: NTPROBNP ?No results for input(s): PROBNP in the last 8760 hours. ?Recent Labs  ?  02/18/21 ?0031 05/01/21 ?1826  ?TSH 1.764 1.268  ? ? ?BMP ?Recent Labs  ?  07/20/21 ?1015 01/02/22 ?1327 01/28/22 ?1347  ?NA 135 136 141  ?K 3.8 4.3 3.2*  ?CL 99 103 105  ?CO2 24 23  22  ?GLUCOSE 101* 89 92  ?BUN 23* 16 22*  ?CREATININE 0.98 0.66 2.10*  ?CALCIUM 9.8 9.1 10.2  ?GFRNONAA >60 >60 40*  ? ? ?HEMOGLOBIN A1C ?Lab Results  ?Component Value Date  ? HGBA1C 5.0 05/01/2021  ? MPG 97 05/01/2021  ? ? ?Cardiac Panel (last 3 results) ?Recent Labs  ?  01/28/22 ?1347 01/28/22 ?1641  ?TROPONINIHS 101* 94*  ? ? ?CHOLESTEROL ?Recent Labs  ?  02/18/21 ?0031 02/21/21 ?7915 04/29/21 ?2200  ?CHOL 264* 227* 236*  ? ? ?Hepatic Function Panel ?Recent Labs  ?  07/20/21 ?1015 01/02/22 ?1327 01/28/22 ?1347  ?PROT 8.2* 7.8 8.1  ?ALBUMIN 5.3* 4.7 5.3*  ?AST 35 36 39  ?ALT 38 38 27  ?ALKPHOS 84 85 80  ?BILITOT 0.6 0.7 1.1  ? ? ? ?Impression: ?Elevated high sensitive troponin likely secondary to supply demand ischemia. ?Polysubstance abuse. ?AKI. ?Hypercholesterolemia ? ?Recommendations: ?Based on the information provided by [facility] Abbott and review of electronic medical records it appears that the his high sensitive troponin is likely due to supply demand ischemia given the polysubstance abuse, and tachycardia.  At this point objective findings are not suggestive of acute coronary syndrome but given the use of cocaine it should still be kept on the differential. ? ?Patient will be kept at [facility] and if additional cardiovascular services requested please consult inpatient cardiology for  further assistance. ? ?In the interim if any other questions or concerns arise please do not hesitate to reach out. ? ?No charge (for documentation purposes). ? ?Tessa Lerner, DO, FACC ? ?Pager: [phone] ?Office: [phone] ? ?

## 2022-01-28 NOTE — ED Triage Notes (Signed)
Pt arrives via [facility] for c/o chest pain. EMS states they picked him up at a gas station. On arrival EMS states he admitted to taking 10 adderall and smoking marijuana today. Pt would not allow ems to get full set of vitals and would not allow ekg. Pt very paranoid and feels as if he is "having a heart attack.  ?

## 2022-01-28 NOTE — ED Notes (Signed)
Pt more calm and cooperative at this time. Cool compress provided d/t pt being warm and diaphoretic.  ?

## 2022-01-28 NOTE — ED Provider Notes (Signed)
?[facility] ?Provider Note ? ? ?CSN: [id number] ?Arrival date & time: 01/28/22  4098 ? ?  ? ?History ? ?Chief Complaint  ?Patient presents with  ? Ingestion  ? ? ?Lance Abbott is a 41 y.o. male. ? ?HPI ? ?Patient with medical history including panic attacks, depression,Bipolar, schizophrenia, polysubstance dependency presents via police due to chest pain.  Originally patient presents emerged part with chest pain and came via EMS he was uncooperative during initial exam and has lab work is being ordered he eloped.  IVC paperwork was taken on the patient,  patient brought back by police who found him running through the neighborhood naked.  History is limited as patient is a poor historian but states that he is having no complaints at this time, he is not endorsing any chest pain shortness of breath pleuritic chest pain.  He states he feels very anxious because he took cocaine last night which is why he is anxious.  He is not endorsing any suicidal homicidal ideations denies any hallucinations delusions denies any recent alcohol use. ? ? ?I have reviewed patient's chart been seen multiple times for similar presentation most recently was last month due to suicidal ideations, this plan was to overdose on cocaine patient was restarted back on his Abilify and trazodone BuSpar gabapentin Wellbutrin, patient was later seen history cleared and discharged home. ?Home Medications ?Prior to Admission medications   ?Medication Sig Start Date End Date Taking? Authorizing Provider  ?amLODipine (NORVASC) 10 MG tablet Take 1 tablet (10 mg total) by mouth daily. 01/04/22   Estella Husk, MD  ?amoxicillin (AMOXIL) 500 MG capsule Take 1 capsule (500 mg total) by mouth 3 (three) times daily. 01/02/22   Gerhard Munch, MD  ?ARIPiprazole (ABILIFY) 15 MG tablet Take 1 tablet (15 mg total) by mouth daily. 01/04/22   Estella Husk, MD  ?atorvastatin (LIPITOR) 20 MG tablet Take 1 tablet (20 mg total) by mouth  daily. 01/04/22   Estella Husk, MD  ?buPROPion (WELLBUTRIN XL) 300 MG 24 hr tablet Take 1 tablet (300 mg total) by mouth daily. 01/05/22   Estella Husk, MD  ?busPIRone (BUSPAR) 15 MG tablet Take 1 tablet (15 mg total) by mouth 3 (three) times daily. 01/04/22   Estella Husk, MD  ?gabapentin (NEURONTIN) 300 MG capsule Take 1 capsule (300 mg total) by mouth 3 (three) times daily. 01/04/22   Estella Husk, MD  ?nicotine (NICODERM CQ - DOSED IN MG/24 HOURS) 21 mg/24hr patch Place 1 patch (21 mg total) onto the skin daily. 01/05/22   Estella Husk, MD  ?QUEtiapine (SEROQUEL) 400 MG tablet Take 1 tablet (400 mg total) by mouth at bedtime. 01/04/22   Estella Husk, MD  ?traZODone (DESYREL) 100 MG tablet Take 1 tablet (100 mg total) by mouth at bedtime as needed for sleep. 01/04/22   Estella Husk, MD  ?   ? ?Allergies    ?Cogentin [benztropine], Zyprexa [olanzapine], Celexa [citalopram hydrobromide], Depakote [valproic acid], Effexor [venlafaxine], Geodon [ziprasidone hcl], Haldol [haloperidol], Hydrochlorothiazide, Lasix [furosemide], Lexapro [escitalopram], Lisinopril, Lithium, Thorazine [chlorpromazine], Topamax [topiramate], and Tramadol   ? ?Review of Systems   ?Review of Systems  ?Constitutional:  Negative for chills and fever.  ?Respiratory:  Negative for shortness of breath.   ?Cardiovascular:  Negative for chest pain.  ?Gastrointestinal:  Negative for abdominal pain.  ?Neurological:  Negative for headaches.  ?Psychiatric/Behavioral:  The patient is nervous/anxious.   ? ?Physical Exam ?Updated Vital Signs ?BP Marland Kitchen)  148/123   Pulse (!) 133   Temp 98.3 ?F (36.8 ?C) (Oral)   Resp (!) 26   Ht 5\' 10"  (1.778 m)   Wt 94.3 kg   SpO2 99%   BMI 29.83 kg/m?  ?Physical Exam ?Vitals and nursing note reviewed.  ?Constitutional:   ?   General: He is not in acute distress. ?   Appearance: He is not ill-appearing.  ?HENT:  ?   Head: Normocephalic and atraumatic.  ?   Comments: No  deformity of the head present no raccoon eyes or battle sign present. ?   Nose: No congestion.  ?   Mouth/Throat:  ?   Mouth: Mucous membranes are moist.  ?   Pharynx: Oropharynx is clear.  ?   Comments: No trismus no torticollis no oral trauma present ?Eyes:  ?   Conjunctiva/sclera: Conjunctivae normal.  ?Cardiovascular:  ?   Rate and Rhythm: Regular rhythm. Tachycardia present.  ?   Pulses: Normal pulses.  ?   Heart sounds: No murmur heard. ?  No friction rub. No gallop.  ?Pulmonary:  ?   Effort: No respiratory distress.  ?   Breath sounds: No wheezing, rhonchi or rales.  ?Abdominal:  ?   Palpations: Abdomen is soft.  ?   Tenderness: There is no abdominal tenderness. There is no right CVA tenderness or left CVA tenderness.  ?Musculoskeletal:  ?   Comments: Patient is in soft restraints but is moving all 4 extremities out difficulty.  ?Skin: ?   General: Skin is warm and dry.  ?   Comments: Has noted abrasion on patient's back and chest  ?Neurological:  ?   Mental Status: He is alert.  ?Psychiatric:     ?   Mood and Affect: Mood normal.  ?   Comments: Patient is alert and orient x4 responding appropriately, does not appear to respond to internal stimuli, denies any hallucinations or delusions.  ? ? ?ED Results / Procedures / Treatments   ?Labs ?(all labs ordered are listed, but only abnormal results are displayed) ?Labs Reviewed  ?COMPREHENSIVE METABOLIC PANEL - Abnormal; Notable for the following components:  ?    Result Value  ? Potassium 3.2 (*)   ? BUN 22 (*)   ? Creatinine, Ser 2.10 (*)   ? Albumin 5.3 (*)   ? GFR, Estimated 40 (*)   ? All other components within normal limits  ?CBC WITH DIFFERENTIAL/PLATELET - Abnormal; Notable for the following components:  ? WBC 19.3 (*)   ? Neutro Abs 17.0 (*)   ? Monocytes Absolute 1.4 (*)   ? Abs Immature Granulocytes 0.10 (*)   ? All other components within normal limits  ?SALICYLATE LEVEL - Abnormal; Notable for the following components:  ? Salicylate Lvl <7.0 (*)   ?  All other components within normal limits  ?TROPONIN I (HIGH SENSITIVITY) - Abnormal; Notable for the following components:  ? Troponin I (High Sensitivity) 101 (*)   ? All other components within normal limits  ?TROPONIN I (HIGH SENSITIVITY) - Abnormal; Notable for the following components:  ? Troponin I (High Sensitivity) 94 (*)   ? All other components within normal limits  ?RESP PANEL BY RT-PCR (FLU A&B, COVID) ARPGX2  ?ETHANOL  ?RAPID URINE DRUG SCREEN, HOSP PERFORMED  ?URINALYSIS, ROUTINE W REFLEX MICROSCOPIC  ? ? ?EKG ?EKG Interpretation ? ?Date/Time:  Friday January 28 2022 12:43:02 EDT ?Ventricular Rate:  141 ?PR Interval:  115 ?QRS Duration: 86 ?QT Interval:  367 ?QTC Calculation:  563 ?R Axis:   65 ?Text Interpretation: Sinus tachycardia Probable left atrial enlargement Borderline repolarization abnormality Minimal ST elevation, lateral leads Prolonged QT interval Baseline wander in lead(s) V6 Partial missing lead(s): V6 Confirmed by Vanetta Mulders [phone]) on 01/28/2022 12:53:48 PM ? ?Radiology ?DG Chest Portable 1 View ? ?Result Date: 01/28/2022 ?CLINICAL DATA:  41 year old male with chest pain. EXAM: PORTABLE CHEST - 1 VIEW COMPARISON:  [id number] FINDINGS: The mediastinal contours are within normal limits. No cardiomegaly. Mild bibasilar subsegmental atelectasis. No focal consolidations. No pleural effusion or pneumothorax. No acute osseous abnormality. IMPRESSION: Mild bibasilar subsegmental atelectasis. Electronically Signed   By: Marliss Coots M.D.   On: 01/28/2022 15:03  ? ?DG Finger Little Left ? ?Result Date: 01/28/2022 ?CLINICAL DATA:  Left fifth finger pain. EXAM: LEFT LITTLE FINGER 2+V COMPARISON:  Left wrist radiographs 01/10/2019 FINDINGS: Normal bone mineralization. Joint spaces are preserved. No acute fracture is seen. No dislocation. IMPRESSION: No acute fracture. Electronically Signed   By: Neita Garnet M.D.   On: 01/28/2022 17:37  ? ?DG Foot Complete Right ? ?Result Date: 01/28/2022 ?CLINICAL  DATA:  Pain in right foot great toe and middle toe. EXAM: RIGHT FOOT COMPLETE - 3+ VIEW COMPARISON:  None. FINDINGS: Normal bone mineralization. Joint spaces are preserved. No acute fracture is seen. No dislocation

## 2022-01-28 NOTE — ED Notes (Signed)
Pt more anxious at this time. Rocking back and forth in bed and making incomprehensible singing noises. States he is "speaking in tongue". New orders for ativan IM  ?

## 2022-01-28 NOTE — ED Notes (Addendum)
Pt thrashing around and uncooperative with care. Ripping off bp cuff with teeth, spitting at police officers. Still able to pull cords off despite bilateral hand cuffs. [employer] placed shackles at this time d/t pt rocking back and forth hitting side rails and kicking. Pt at risk for hurting self. Pt stating he doesn't want care or help and wants to die.  [employer] at bedside along with nurse and nurse tech attempting to help calm pt. PA notified that pt may need further non-violent restraint if he continues to roll torso back and forth. Will continue to monitor for need to ensure pt safety and update PA as needed.  ?
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
## 2022-01-28 NOTE — ED Notes (Signed)
Pt returned by [employer] at this time. Pt returned to room 17. Handcuffs in place per [employer]  ?

## 2022-01-28 NOTE — ED Notes (Addendum)
During triage pt became paranoid and kept saying he just needed to use bathroom. Pt began walking out of room down hallway. Police and security at bedside. State they cannot touch him unless pt is IVC'ed, which pt is not at this time. EMS states that pt ran away from them originally when they were getting him off the truck to come inside, but Paramedic was able to talk him into coming back inside. EDP notified. RN attempted to walk with pt and verbally de-escalate, but unsuccessful.  ?Pt Did admit to taking pills on purpose to end life  ?

## 2022-01-28 NOTE — ED Notes (Signed)
All restraints removed at this time.

## 2022-01-28 NOTE — ED Notes (Signed)
Pt continues pulling off monitoring leads and putting EKG leads in mouth and chewing them, pt is not redirectable or cooperative at this time. Dr Blinda Leatherwood made aware- new orders received.  ?

## 2022-01-28 NOTE — ED Notes (Signed)
Pt becoming increasingly more uncooperative- Pt standing on stretcher, not responding when this NT asked him to cooperate. When this NT went in the room to try to redirect found a piece of the bed in the pocket of the pt's pants. Also parts of the bed in the blanket as well as on the floor. Reported occurrences to Haematologist.  ?

## 2022-01-28 NOTE — ED Notes (Signed)
Ace bandages applied to bilateral wrists and ankles to prevent abrasions d/t pt continuous kicking and pulling at cuffs/shackle. Is not being violent toward ed staff at this time. [employer] at bedside.  ?

## 2022-01-28 NOTE — ED Notes (Addendum)
Shackles removed. ?

## 2022-01-28 NOTE — ED Notes (Signed)
Left forensic wrist restraint removed by [employer]. Ace bandage removed as well. Pt noted with multiple areas of redness/abraision to medial aspect of left wrist. Cool compress given. Pt calm and cooperative at this time. Explained that if pt can show that he is going to remain calm and refrain from acts that put him at risk of hurting self or interfere with care that his last forensic restraint can be removed. Pt states his back is hurting at this time as well from falling earlier with [employer]. Superficial abraisions noted on front and left side of torso as well as on back. ?

## 2022-01-28 NOTE — ED Notes (Signed)
PA notified that pts HR remains elevated. Reports that cardiologist will be coming for consult  ?

## 2022-01-28 NOTE — ED Provider Notes (Cosign Needed)
Patient with history of paranoid schizophrenia presents  presents via EMS due to paranoia voluntarily, apparently patient took 10 pills of his Adderall and smoked marijuana.  When I went in to assess the patient he was found standing in his room with his shirt off, he would not answer any questions nor would he allow me to touch him.  I had left the room to start to place orders when I was notified that patient had ran off.  I have filled out IVC paperwork and will await police to bring him back. ?  ?Marcello Fennel, PA-C ?01/28/22 [id number] ? ?

## 2022-01-28 NOTE — ED Notes (Addendum)
This nurse attempted to redirect pt- informed pt that if he could not cooperate, we would have to put him in restraints for his safety and treatment which includes monitoring of pt. Pt verbalized understanding.  ?

## 2022-01-29 ENCOUNTER — Observation Stay (HOSPITAL_BASED_OUTPATIENT_CLINIC_OR_DEPARTMENT_OTHER): Payer: Self-pay

## 2022-01-29 DIAGNOSIS — F1910 Other psychoactive substance abuse, uncomplicated: Secondary | ICD-10-CM

## 2022-01-29 DIAGNOSIS — D72829 Elevated white blood cell count, unspecified: Secondary | ICD-10-CM

## 2022-01-29 DIAGNOSIS — R778 Other specified abnormalities of plasma proteins: Secondary | ICD-10-CM

## 2022-01-29 DIAGNOSIS — I10 Essential (primary) hypertension: Secondary | ICD-10-CM

## 2022-01-29 DIAGNOSIS — F1011 Alcohol abuse, in remission: Secondary | ICD-10-CM

## 2022-01-29 DIAGNOSIS — F302 Manic episode, severe with psychotic symptoms: Secondary | ICD-10-CM

## 2022-01-29 DIAGNOSIS — I248 Other forms of acute ischemic heart disease: Secondary | ICD-10-CM

## 2022-01-29 DIAGNOSIS — F1021 Alcohol dependence, in remission: Secondary | ICD-10-CM

## 2022-01-29 DIAGNOSIS — N179 Acute kidney failure, unspecified: Secondary | ICD-10-CM

## 2022-01-29 LAB — BASIC METABOLIC PANEL
Anion gap: 7 (ref 5–15)
BUN: 16 mg/dL (ref 6–20)
CO2: 24 mmol/L (ref 22–32)
Calcium: 8.7 mg/dL — ABNORMAL LOW (ref 8.9–10.3)
Chloride: 108 mmol/L (ref 98–111)
Creatinine, Ser: 1.05 mg/dL (ref 0.61–1.24)
GFR, Estimated: >60 mL/min (ref >60)
Glucose, Bld: 83 mg/dL (ref 70–99)
Potassium: 3.2 mmol/L — ABNORMAL LOW (ref 3.5–5.1)
Sodium: 139 mmol/L (ref 135–145)

## 2022-01-29 LAB — CBC
HCT: 36.9 % — ABNORMAL LOW (ref 39.0–52.0)
Hemoglobin: 12.6 g/dL — ABNORMAL LOW (ref 13.0–17.0)
MCH: 30.8 pg (ref 26.0–34.0)
MCHC: 34.1 g/dL (ref 30.0–36.0)
MCV: 90.2 fL (ref 80.0–100.0)
Platelets: 241 10*3/uL (ref 150–400)
RBC: 4.09 MIL/uL — ABNORMAL LOW (ref 4.22–5.81)
RDW: 13.4 % (ref 11.5–15.5)
WBC: 11.8 10*3/uL — ABNORMAL HIGH (ref 4.0–10.5)
nRBC: 0 % (ref 0.0–0.2)

## 2022-01-29 LAB — ECHOCARDIOGRAM COMPLETE
Area-P 1/2: 5.31 cm2
Height: 70 in
S' Lateral: 3.1 cm
Weight: 3326.3 oz

## 2022-01-29 LAB — URINALYSIS, ROUTINE W REFLEX MICROSCOPIC
Bilirubin Urine: NEGATIVE
Glucose, UA: NEGATIVE mg/dL
Hgb urine dipstick: NEGATIVE
Ketones, ur: NEGATIVE mg/dL
Leukocytes,Ua: NEGATIVE
Nitrite: NEGATIVE
Protein, ur: 30 mg/dL — AB
Specific Gravity, Urine: 1.025 (ref 1.005–1.030)
pH: 5 (ref 5.0–8.0)

## 2022-01-29 LAB — RAPID URINE DRUG SCREEN, HOSP PERFORMED
Amphetamines: POSITIVE — AB
Barbiturates: NOT DETECTED
Benzodiazepines: POSITIVE — AB
Cocaine: NOT DETECTED
Opiates: POSITIVE — AB
Tetrahydrocannabinol: POSITIVE — AB

## 2022-01-29 LAB — HIV ANTIBODY (ROUTINE TESTING W REFLEX): HIV Screen 4th Generation wRfx: NONREACTIVE

## 2022-01-29 MED ORDER — STERILE WATER FOR INJECTION IJ SOLN
INTRAMUSCULAR | Status: AC
  Filled 2022-01-29: qty 10

## 2022-01-29 MED ORDER — ZIPRASIDONE MESYLATE 20 MG IM SOLR
20.0000 mg | Freq: Once | INTRAMUSCULAR | Status: AC
  Administered 2022-01-29: 20 mg via INTRAMUSCULAR
  Filled 2022-01-29: qty 20

## 2022-01-29 MED ORDER — CYCLOBENZAPRINE HCL 10 MG PO TABS: 10.0000 mg | ORAL_TABLET | Freq: Three times a day (TID) | ORAL | Status: DC | PRN

## 2022-01-29 MED ORDER — SODIUM CHLORIDE 0.9 % IV BOLUS
1000.0000 mL | Freq: Once | INTRAVENOUS | Status: AC
  Administered 2022-01-29: 1000 mL via INTRAVENOUS

## 2022-01-29 NOTE — Progress Notes (Signed)
  Echocardiogram 2D Echocardiogram has been performed.  Atiba Kimberlin M     

## 2022-01-29 NOTE — ED Notes (Signed)
This nurse and Tim, RN, [facility] talked with Dr Adrian Blackwater who reviewed pts chart, labs, and disposition- dispo changed from ICU stepdown to med-surg, pt placement notified and instructed by Tim to assign pt bed due to length of time pt has been waiting on bed. Bed is now assigned on 3rd floor. Pt is sleeping at this time. Sitter will go upstairs with pt.  ?

## 2022-01-29 NOTE — ED Notes (Signed)
Pt seen with toothbrush with sharp end on it (makeshift shank), stated he was trying to clean his teeth and nails with object. Nurse removed and disposed of object. ?

## 2022-01-29 NOTE — ED Notes (Signed)
Pharmacy called to ensure LR and NS are compatible. Will initiate NS bolus at this time. ?

## 2022-01-29 NOTE — ED Notes (Signed)
Provider at bedside

## 2022-01-29 NOTE — Progress Notes (Signed)
?PROGRESS NOTE ? ? ? ?Lance Abbott  TWS:[id number] DOB: 02-02-1981 DOA: 01/28/2022 ?PCP: Pcp, No  ? ?Brief Narrative:  ?HPI per Dr. Candelaria Celeste on 01/28/22 ? Lance Abbott is a 41 y.o. male with medical history significant of hypertension, bipolar 1 disorder, chronic pain, history of polysubstance abuse including alcohol, cocaine, amphetamines, marijuana.  Patient initially brought to the hospital by EMS for chest pain.  They picked him up at a gas station.  He admits to taking Adderall and smoking marijuana and taking cocaine yesterday.  Patient became extremely paranoid during triage and began to leave the hospital.  At 1 point, the patient mentioned taking these drugs to end his life.  As the patient was felt he was in danger to his self the EDP involuntarily committed the patient and the patient was retrieved by [facility] police.  The patient states that he is extremely anxious and feels like his chest pain is due to his anxiousness.  At one point, the patient was chest pain-free.  On his return, the patient received Geodon, Ativan, and morphine.  The patient has been much calmer since that time. ?  ?The patient was initially tachycardic to the 140s.  Work-up shows a creatinine of 2.2.  His troponin was of 100 and a repeat of 94.  Cardiology was consulted who feels that this is not ACS but likely demand ischemia due to his extreme tachycardia and polysubstance abuse. ? ?**Interim History ?Patient has been intermittently agitated and confused and he was IVC last night.  He continues to have a tachycardia in the setting of his amphetamine use but echocardiogram has been normal.  He currently has no signs and symptoms of infection.  He has significant psychiatric issues and was found to be making weapons 1 with a toothbrush and 1 with part of the bed.  We will discontinue his telemetry monitoring given concern for the patient's safety.  Currently awaiting TTS evaluation given that he is improved from medical  standpoint given his AKI being improved significantly and WBC trending down.  ? ? ?Assessment and Plan: ?No notes have been filed under this hospital service. ?Service: Hospitalist ? ?AKI ?-Observation ?-IV fluids overnight and given a bolus ?-Patient's BUNs/creatinine went from 22/2.10 is now 16/1.05 ?-Avoid further nephrotoxic medications, contrast dyes, hypotension and renally dose medications ?-Repeat CMP in a.m. ? ?Hypokalemia ?-Replete ?-Potassium was 3.2 ?-Continue to monitor and replete as necessary ? ?Polysubstance abuse including cocaine use, amphetamine use, alcohol use ?-CIWA protocol ?-Echo for tachycardia and elevated troponin was relatively unremarkable ?-Will give labetalol for tachycardia related to amphetamine and cocaine use (studies have shown that it is safe for patient with amphetamine and cocaine use due to mixed beta/alpha blocker profile).  ?-UDS was positive for amphetamines, benzodiazepines, opiates and THC ? ?Leukocytosis ?-No evidence of acute infection and urinalysis is negative ?-There are studies showing leukocytosis after amphetamine and cocaine use. ?-Repeat CBC in the morning ? ?Bipolar disorder with suicidal ideation and schizophrenia ?-Had increased and ingestion of more than is recommended Adderall and marijuana along with cocaine and alcohol but no cocaine was found on UDS ?-IVC ?-Consulted psych when medically cleared ?-Sitter at bedside ?-We will need likely psychiatric inpatient hospitalization given that he continues to be a little manic and psychotic and given safety concerns for staff given that he is finding things to try and make weapons with ? ?Sinus Tachycardia ?-In setting of methamphetamine use ?-Continue with IV fluids for now ? ?Hypertension ?-Continue Amlodipine now that patient  is no longer Hypotensive ?-Continue to Monitor BP per Protocol ?-Last BP reading is now 132/92 ?  ?Normocytic anemia ?-Patient's hemoglobin/hematocrit went from 14.8/42.3 and is now  12.6/36.9 ?-Likely dilutional drop from IV fluid resuscitation ?-Patient to have outpatient anemia panel ?-Continue monitor and trend and continue monitor for signs and symptoms of bleeding ?-Repeat CBC in a.m. ? ?DVT prophylaxis: enoxaparin (LOVENOX) injection 40 mg Start: 01/29/22 1000 ? ?  Code Status: Full Code ?Family Communication: No family present at bedside  ? ?Disposition Plan:  ?Level of care: Stepdown ?Status is: Observation ?The patient will require care spanning > 2 midnights and should be moved to inpatient because: Remains IVC'd and needs Psych evaluation ?  ? ?Consultants:  ?Psychiatry; TTS ? ?Procedures:  ?ECHOCARDIOGRAM ?IMPRESSIONS  ? ? ? 1. Left ventricular ejection fraction, by estimation, is 60 to 65%. The  ?left ventricle has normal function. The left ventricle has no regional  ?wall motion abnormalities. There is mild left ventricular hypertrophy.  ?Left ventricular diastolic parameters  ?were normal.  ? 2. Right ventricular systolic function is normal. The right ventricular  ?size is normal. Tricuspid regurgitation signal is inadequate for assessing  ?PA pressure.  ? 3. The mitral valve is normal in structure. No evidence of mitral valve  ?regurgitation. No evidence of mitral stenosis.  ? 4. The aortic valve is tricuspid. Aortic valve regurgitation is not  ?visualized. No aortic stenosis is present.  ? 5. The inferior vena cava is normal in size with greater than 50%  ?respiratory variability, suggesting right atrial pressure of 3 mmHg.  ? ?FINDINGS  ? Left Ventricle: Left ventricular ejection fraction, by estimation, is 60  ?to 65%. The left ventricle has normal function. The left ventricle has no  ?regional wall motion abnormalities. The left ventricular internal cavity  ?size was normal in size. There is  ? mild left ventricular hypertrophy. Left ventricular diastolic parameters  ?were normal.  ? ?Right Ventricle: The right ventricular size is normal. No increase in  ?right ventricular  wall thickness. Right ventricular systolic function is  ?normal. Tricuspid regurgitation signal is inadequate for assessing PA  ?pressure.  ? ?Left Atrium: Left atrial size was normal in size.  ? ?Right Atrium: Right atrial size was normal in size.  ? ?Pericardium: There is no evidence of pericardial effusion.  ? ?Mitral Valve: The mitral valve is normal in structure. No evidence of  ?mitral valve regurgitation. No evidence of mitral valve stenosis.  ? ?Tricuspid Valve: The tricuspid valve is normal in structure. Tricuspid  ?valve regurgitation is trivial.  ? ?Aortic Valve: The aortic valve is tricuspid. Aortic valve regurgitation is  ?not visualized. No aortic stenosis is present.  ? ?Pulmonic Valve: The pulmonic valve was not well visualized. Pulmonic valve  ?regurgitation is not visualized.  ? ?Aorta: The aortic root is normal in size and structure.  ? ?Venous: The inferior vena cava is normal in size with greater than 50%  ?respiratory variability, suggesting right atrial pressure of 3 mmHg.  ? ?IAS/Shunts: The interatrial septum was not well visualized.  ? ?   ?LEFT VENTRICLE  ?PLAX 2D  ?LVIDd:         4.20 cm Diastology  ?LVIDs:         3.10 cm LV e' medial:    10.10 cm/s  ?LV PW:         1.00 cm LV E/e' medial:  9.5  ?LV IVS:        1.20 cm LV e'  lateral:   14.00 cm/s  ?                       LV E/e' lateral: 6.9  ?   ? ?RIGHT VENTRICLE  ?TAPSE (M-mode): 1.7 cm  ? ?LEFT ATRIUM             Index        RIGHT ATRIUM           Index  ?LA diam:        3.70 cm 1.74 cm/m?   RA Area:     11.90 cm?  ?LA Vol (A2C):   40.9 ml 19.27 ml/m?  RA Volume:   25.30 ml  11.92 ml/m?  ?LA Vol (A4C):   43.0 ml 20.26 ml/m?  ?LA Biplane Vol: 42.3 ml 19.93 ml/m?  ? AORTIC VALVE  ?LVOT Vmax:   125.00 cm/s  ?LVOT Vmean:  81.200 cm/s  ?LVOT VTI:    0.185 m  ? ?MITRAL VALVE  ?MV Area (PHT): 5.31 cm?    SHUNTS  ?MV Decel Time: 143 msec    Systemic VTI: 0.18 m  ?MV E velocity: 96.00 cm/s  ?MV A velocity: 83.60 cm/s  ?MV E/A ratio:  1.15   ? ?Antimicrobials:  ?Anti-infectives (From admission, onward)  ? ? None  ? ?  ?  ? ?Subjective: ?Seen and examined at bedside and he was complaining of some pain from the abrasions on his back.  States that he was ta

## 2022-01-29 NOTE — ED Notes (Signed)
Pt broke a piece off the bed and wrapped aluminium foil around it. This was taken from pt and security called to check pts body. [employer] at bedside now. ?

## 2022-01-29 NOTE — Hospital Course (Addendum)
HPI per Dr. Loma Boston on 01/28/22 ? Lance Abbott is a 41 y.o. male with medical history significant of hypertension, bipolar 1 disorder, chronic pain, history of polysubstance abuse including alcohol, cocaine, amphetamines, marijuana.  Patient initially brought to the hospital by EMS for chest pain.  They picked him up at a gas station.  He admits to taking Adderall and smoking marijuana and taking cocaine yesterday.  Patient became extremely paranoid during triage and began to leave the hospital.  At 1 point, the patient mentioned taking these drugs to end his life.  As the patient was felt he was in danger to his self the EDP involuntarily committed the patient and the patient was retrieved by [facility] police.  The patient states that he is extremely anxious and feels like his chest pain is due to his anxiousness.  At one point, the patient was chest pain-free.  On his return, the patient received Geodon, Ativan, and morphine.  The patient has been much calmer since that time. ?  ?The patient was initially tachycardic to the 140s.  Work-up shows a creatinine of 2.2.  His troponin was of 100 and a repeat of 94.  Cardiology was consulted who feels that this is not ACS but likely demand ischemia due to his extreme tachycardia and polysubstance abuse. ? ?**Interim History ?Patient has been intermittently agitated and confused and he was IVC last night.  He continues to have a tachycardia in the setting of his amphetamine use but echocardiogram has been normal.  He currently has no signs and symptoms of infection.  He has significant psychiatric issues and was found to be making weapons 1 with a toothbrush and 1 with part of the bed.  We will discontinue his telemetry monitoring given concern for the patient's safety.  Currently awaiting TTS evaluation given that he is improved from medical standpoint given his AKI being improved significantly and WBC trending down. ? ?From a Medical standpoint his labs are  improved significantly but patient is now continuing to see things and hallucinating.  TTS was called and they feel that he is not a danger to himself or anybody else.  Given that he continues to have hallucinations we will continue to treat him for withdrawals however it is less likely related to alcohol and more related to his methamphetamine usage.  Psychiatry has made some recommendation changes which have been implemented for mood stabilization.  They are recommending changing BuSpar to BID rather than TID, Abilify 15 mg po TID; D/C Wellbutrin; D/C Trazodone; Change Neurontin to 600 mg po BID yesterday.  ? ?Today psychiatry has changed the medications again I have now discontinued Abilify and started the patient on Seroquel 100 mg p.o. daily and 400 nightly and after further reevaluation they feel that he would need inpatient psychiatric hospitalization.  From a medical standpoint patient has been medically stable to be discharged to inpatient psychiatric care given that all his medical issues have been taking care of. ? ? ?

## 2022-01-29 NOTE — Progress Notes (Addendum)
Patient no longer meets criteria for stepdown. Order placed for med surg.  ?Needs rpt labs in AM. If normal WBC, will probably be medically cleared. ? ?Levie Heritage, DO ?01/29/2022 ?7:27 PM ? ?

## 2022-01-29 NOTE — Progress Notes (Addendum)
Patient seen and examined this AM and from a medical standpoint he is improving his AKI has significantly improved with IV fluid hydration.  WBC is trending down likely reactive in the setting of his cocaine and amphetamine use.  No current signs or symptoms of infection.  Patient's echocardiogram is still pending to be read. Patient continues to be tachycardic in the setting of his amphetamines and will allow time for this to improved.  His hemoglobin/hematocrit did drop but this is likely a dilutional drop in the setting of his IV for resuscitation.  Patient is significantly agitated and has made 2 different weapons from this.  Patient from a medical standpoint can go to a psychiatric unit given his significant psychiatric issues that are superseding his medical issues pending that he has no abnormalities on his echocardiogram. ? ?**Addendum 15:30: Cardiology read the ECHO and showed "Left ventricular ejection fraction, by estimation, is 60 to 65%. The left ventricle has normal function. The left ventricle has no regional  wall motion  abnormalities. There is mild left ventricular hypertrophy. Left ventricular diastolic parameters  ?were normal. Right ventricular systolic function is normal. The right ventricular size is normal. Tricuspid regurgitation signal is inadequate for assessing PA pressure. The mitral valve is normal in structure. No evidence of mitral valve regurgitation. No evidence of mitral stenosis. The aortic valve is tricuspid. Aortic valve regurgitation is not visualized. No aortic stenosis is present. The inferior vena cava is normal in size with greater than 50% respiratory variability, suggesting right atrial pressure of 3 mmHg." ? ?-Given normal ECHO his High Sensitivity Troponin is due to supply demand ischemia given polysbustance abuse and tachycardia and not really suggestive of ACS. Patient's Potassium has been replete and will need close Monitoring given his Bipolar Disorder,  Schizophrenia.  ?

## 2022-01-29 NOTE — ED Notes (Signed)
Patient is resting comfortably. 

## 2022-01-29 NOTE — ED Notes (Signed)
This nurse was notified by Ahmed Prima and Herbert Seta, [facility] that bed 303 for pt has been taken back due to staffing issues in the morning on the 3rd floor. Primary nurse, Sue Lush made aware. Will continue to hold pt in the ED- pt made aware of this.  ?

## 2022-01-29 NOTE — ED Notes (Signed)
Pt's IV tubing was tangled and setting off pump alarm which agitated the pt. IV tubing was adjusted, site was redressed with new tegaderm and tape for pt comfort. Pt given juice and chips, calm and cooperative at this time.  ?

## 2022-01-29 NOTE — ED Notes (Signed)
Pt cooperative during medication administration. ?

## 2022-01-29 NOTE — ED Notes (Signed)
Pt sleeping. Restraints removed. Zero noted skin breakdown.  ?

## 2022-01-29 NOTE — ED Notes (Signed)
Pt sleeping at this time. Holding non-critical medications until he awakens to prevent aggravating pt.  ?

## 2022-01-30 DIAGNOSIS — F200 Paranoid schizophrenia: Secondary | ICD-10-CM

## 2022-01-30 LAB — CBC WITH DIFFERENTIAL/PLATELET
Abs Immature Granulocytes: 0.03 10*3/uL (ref 0.00–0.07)
Basophils Absolute: 0.1 10*3/uL (ref 0.0–0.1)
Basophils Relative: 1 %
Eosinophils Absolute: 0.1 10*3/uL (ref 0.0–0.5)
Eosinophils Relative: 1 %
HCT: 38.2 % — ABNORMAL LOW (ref 39.0–52.0)
Hemoglobin: 12.4 g/dL — ABNORMAL LOW (ref 13.0–17.0)
Immature Granulocytes: 0 %
Lymphocytes Relative: 15 %
Lymphs Abs: 1.6 10*3/uL (ref 0.7–4.0)
MCH: 30 pg (ref 26.0–34.0)
MCHC: 32.5 g/dL (ref 30.0–36.0)
MCV: 92.3 fL (ref 80.0–100.0)
Monocytes Absolute: 0.9 10*3/uL (ref 0.1–1.0)
Monocytes Relative: 9 %
Neutro Abs: 7.8 10*3/uL — ABNORMAL HIGH (ref 1.7–7.7)
Neutrophils Relative %: 74 %
Platelets: 239 10*3/uL (ref 150–400)
RBC: 4.14 MIL/uL — ABNORMAL LOW (ref 4.22–5.81)
RDW: 13.4 % (ref 11.5–15.5)
WBC: 10.4 10*3/uL (ref 4.0–10.5)
nRBC: 0 % (ref 0.0–0.2)

## 2022-01-30 LAB — COMPREHENSIVE METABOLIC PANEL
ALT: 34 U/L (ref 0–44)
AST: 74 U/L — ABNORMAL HIGH (ref 15–41)
Albumin: 3.8 g/dL (ref 3.5–5.0)
Alkaline Phosphatase: 62 U/L (ref 38–126)
Anion gap: 7 (ref 5–15)
BUN: 12 mg/dL (ref 6–20)
CO2: 23 mmol/L (ref 22–32)
Calcium: 9 mg/dL (ref 8.9–10.3)
Chloride: 111 mmol/L (ref 98–111)
Creatinine, Ser: 0.92 mg/dL (ref 0.61–1.24)
GFR, Estimated: >60 mL/min (ref >60)
Glucose, Bld: 125 mg/dL — ABNORMAL HIGH (ref 70–99)
Potassium: 3.8 mmol/L (ref 3.5–5.1)
Sodium: 141 mmol/L (ref 135–145)
Total Bilirubin: 0.5 mg/dL (ref 0.3–1.2)
Total Protein: 6.1 g/dL — ABNORMAL LOW (ref 6.5–8.1)

## 2022-01-30 LAB — PHOSPHORUS: Phosphorus: 2.2 mg/dL — ABNORMAL LOW (ref 2.5–4.6)

## 2022-01-30 LAB — MAGNESIUM: Magnesium: 2 mg/dL (ref 1.7–2.4)

## 2022-01-30 MED ORDER — MORPHINE SULFATE (PF) 2 MG/ML IV SOLN
2.0000 mg | Freq: Once | INTRAVENOUS | Status: AC
  Administered 2022-01-30: 2 mg via INTRAVENOUS
  Filled 2022-01-30: qty 1

## 2022-01-30 MED ORDER — ZIPRASIDONE MESYLATE 20 MG IM SOLR
20.0000 mg | Freq: Once | INTRAMUSCULAR | Status: AC
  Administered 2022-01-30: 20 mg via INTRAMUSCULAR
  Filled 2022-01-30 (×2): qty 20
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
## 2022-01-30 NOTE — Progress Notes (Signed)
CSW received consult for substance abuse counseling - CSW will follow up with patient after psychiatric clearance. ? ?Edwin Dada, MSW, LCSW ?Transitions of Care  Clinical Social Worker II ?[phone] ? ?

## 2022-01-30 NOTE — Progress Notes (Signed)
EkG performed and reported back to provider and appears to be NSR with appropriate OT/QTC intervals,. No new orders initiated.  ?

## 2022-01-30 NOTE — Progress Notes (Signed)
Patient agitated , restless and c/o generalized pain at multiple locations. Oxycodone ,antivan and Seroquel given. Patient continues to be agitated and restless. He is requesting the provider give him Geodon and morphine. Above behavior relayed to floor covering provider. I have received an order to do a EXG. ?

## 2022-01-30 NOTE — Progress Notes (Signed)
?PROGRESS NOTE ? ? ? ?Lance Abbott  OZH:[id number] DOB: 09/17/1981 DOA: 01/28/2022 ?PCP: Pcp, No  ? ?Brief Narrative:  ?HPI per Dr. Candelaria CelesteJacob Stinson on 01/28/22 ? Lance Abbott is a 41 y.o. male with medical history significant of hypertension, bipolar 1 disorder, chronic pain, history of polysubstance abuse including alcohol, cocaine, amphetamines, marijuana.  Patient initially brought to the hospital by EMS for chest pain.  They picked him up at a gas station.  He admits to taking Adderall and smoking marijuana and taking cocaine yesterday.  Patient became extremely paranoid during triage and began to leave the hospital.  At 1 point, the patient mentioned taking these drugs to end his life.  As the patient was felt he was in danger to his self the EDP involuntarily committed the patient and [place] [facility] [place].  The patient states that he is extremely anxious and feels like his chest pain is due to his anxiousness.  At one point, the patient was chest pain-free.  On his return, the patient received Geodon, Ativan, and morphine.  The patient has been much calmer since that time. ?  ?The patient was initially tachycardic to the 140s.  Work-up shows a creatinine of 2.2.  His troponin was of 100 and a repeat of 94.  Cardiology was consulted who feels that this is not ACS but likely demand ischemia due to his extreme tachycardia and polysubstance abuse. ? ?**Interim History ?Patient has been intermittently agitated and confused and he was IVC last night.  He continues to have a tachycardia in the setting of his amphetamine use but echocardiogram has been normal.  He currently has no signs and symptoms of infection.  He has significant psychiatric issues and was found to be making weapons 1 with a toothbrush and 1 with part of the bed.  We will discontinue his telemetry monitoring given concern for the patient's safety.  Currently awaiting TTS evaluation given that he is improved from medical  standpoint given his AKI being improved significantly and WBC trending down.  ? ? ?Assessment and Plan: ?No notes have been filed under this hospital service. ?Service: Hospitalist ? ?AKI, improved and resolved  ?-Observation ?-IV fluids overnight and given a bolus ?-Patient's BUNs/creatinine went from 22/2.10 -> 16/1.05 -> 12/0.92 ?-Avoid further nephrotoxic medications, contrast dyes, hypotension and renally dose medications ?-Repeat CMP in a.m. ?  ?Hypokalemia ?-Replete and improved ?-Potassium was 3.2 and improved to 3.8 ?-Continue to monitor and replete as necessary ?-Repeat CMP in the AM  ?  ?Polysubstance abuse including cocaine use, amphetamine use, alcohol use ?-CIWA protocol initiated  ?-Echo for tachycardia and elevated troponin was relatively unremarkable ?-Will give labetalol for tachycardia related to amphetamine and cocaine use (studies have shown that it is safe for patient with amphetamine and cocaine use due to mixed beta/alpha blocker profile).  ?-UDS was positive for amphetamines, benzodiazepines, opiates and THC ?-Substance Counseling to be given ?  ?Leukocytosis ?-No evidence of acute infection and urinalysis is negative ?-WBC went from 19.3 -> 11.8 -> 10.4 ?-There are studies showing leukocytosis after amphetamine and cocaine use. ?-Repeat CBCin the AM ?  ?Bipolar disorder with suicidal ideation and schizophrenia as well as mania and delusions ?-Had increased and ingestion of more than is recommended Adderall and marijuana along with cocaine and alcohol but no cocaine was found on UDS ?-IVC ?-Consulted psych when medically cleared; psychiatry evaluation still pending ?-Sitter at bedside ?-We will need likely psychiatric inpatient hospitalization given that he continues to  be a little manic and psychotic and given safety concerns for staff given that he is finding things to try and make weapons with yesterday ?-Today the patient thinks he is [place] ?  ?Sinus Tachycardia ?-In setting of  methamphetamine use ?-Continue with IV fluids for now and this is improved ?-Patient's heart rate has significantly improved and is in the 80s now ?  ?Hypertension ?-Continue Amlodipine now that patient is no longer Hypotensive ?-Continue to Monitor BP per Protocol ?-Last BP reading is now 123/84 ?  ?Normocytic anemia ?-Patient's hemoglobin/hematocrit went from 14.8/42.3 and is now 12.6/36.9 yesterday and today is 12.4/30 point ?-Likely dilutional drop from IV fluid resuscitation ?-Patient to have outpatient anemia panel ?-Continue monitor and trend and continue monitor for signs and symptoms of bleeding ?-Repeat CBC in a.m. ? ?Hypophosphatemia ?-Patient's Phos level was 2.2 ?-Replete with p.o. K-Phos Neutral 500 mg x 1 ?-Continue monitor and replete as necessary ?-Repeat phosphorus level in the morning ? ?Elevated AST ?-Mild in the setting of his alcohol use and drug use ?-AST and ALT were normal on admission with 39 and 27 respectively.  AST is now 74 and ALT is 34 ?-Continue monitor and trend intermittently but expect to improve and if not improving will consider right upper quadrant ultrasound as well as acute hepatitis panel. ?-Repeat CMP in the morning ? ?Back abrasions ?-In the setting of getting thrown to the ground by police and being on the floor ?-Wound nurse consulted and they are recommending as needed moisturizing with Eucerin cream ?  ? ?DVT prophylaxis: enoxaparin (LOVENOX) injection 40 mg Start: 01/29/22 1000 ? ?  Code Status: Full Code ?Family Communication: No family currently at bedside ? ?Disposition Plan:  ?Level of care: Med-Surg ?Status is: Inpatient ?Remains inpatient appropriate because: Awaiting psychiatric evaluation given that he is involuntary committed. FROM A MEDICAL STANDPOINT PATIENT IS STABLE.   ? ?Consultants:  ?TTS/PSYCHIATRY  ? ?Procedures:  ?ECHOCARDIOGRAM ?IMPRESSIONS  ? ? ? 1. Left ventricular ejection fraction, by estimation, is 60 to 65%. The  ?left ventricle has normal  function. The left ventricle has no regional  ?wall motion abnormalities. There is mild left ventricular hypertrophy.  ?Left ventricular diastolic parameters  ?were normal.  ? 2. Right ventricular systolic function is normal. The right ventricular  ?size is normal. Tricuspid regurgitation signal is inadequate for assessing  ?PA pressure.  ? 3. The mitral valve is normal in structure. No evidence of mitral valve  ?regurgitation. No evidence of mitral stenosis.  ? 4. The aortic valve is tricuspid. Aortic valve regurgitation is not  ?visualized. No aortic stenosis is present.  ? 5. The inferior vena cava is normal in size with greater than 50%  ?respiratory variability, suggesting right atrial pressure of 3 mmHg.  ? ?FINDINGS  ? Left Ventricle: Left ventricular ejection fraction, by estimation, is 60  ?to 65%. The left ventricle has normal function. The left ventricle has no  ?regional wall motion abnormalities. The left ventricular internal cavity  ?size was normal in size. There is  ? mild left ventricular hypertrophy. Left ventricular diastolic parameters  ?were normal.  ? ?Right Ventricle: The right ventricular size is normal. No increase in  ?right ventricular wall thickness. Right ventricular systolic function is  ?normal. Tricuspid regurgitation signal is inadequate for assessing PA  ?pressure.  ? ?Left Atrium: Left atrial size was normal in size.  ? ?Right Atrium: Right atrial size was normal in size.  ? ?Pericardium: There is no evidence of pericardial effusion.  ? ?  Mitral Valve: The mitral valve is normal in structure. No evidence of  ?mitral valve regurgitation. No evidence of mitral valve stenosis.  ? ?Tricuspid Valve: The tricuspid valve is normal in structure. Tricuspid  ?valve regurgitation is trivial.  ? ?Aortic Valve: The aortic valve is tricuspid. Aortic valve regurgitation is  ?not visualized. No aortic stenosis is present.  ? ?Pulmonic Valve: The pulmonic valve was not well visualized. Pulmonic  valve  ?regurgitation is not visualized.  ? ?Aorta: The aortic root is normal in size and structure.  ? ?Venous: The inferior vena cava is normal in size with greater than 50%  ?respiratory variability, suggesting right atria

## 2022-01-31 MED ORDER — ARIPIPRAZOLE 5 MG PO TABS
15.0000 mg | ORAL_TABLET | Freq: Every day | ORAL | Status: DC
  Administered 2022-01-31: 15 mg via ORAL
  Filled 2022-01-31: qty 1

## 2022-01-31 MED ORDER — ARIPIPRAZOLE 5 MG PO TABS
15.0000 mg | ORAL_TABLET | Freq: Three times a day (TID) | ORAL | Status: DC
  Administered 2022-01-31 – 2022-02-01 (×2): 15 mg via ORAL
  Filled 2022-01-31 (×2): qty 1

## 2022-01-31 MED ORDER — HYDROCERIN EX CREA
TOPICAL_CREAM | Freq: Every day | CUTANEOUS | Status: DC
  Filled 2022-01-31: qty 113

## 2022-01-31 MED ORDER — BUPROPION HCL ER (XL) 300 MG PO TB24
300.0000 mg | ORAL_TABLET | Freq: Every day | ORAL | Status: DC
  Administered 2022-01-31: 300 mg via ORAL
  Filled 2022-01-31: qty 1

## 2022-01-31 MED ORDER — LACTATED RINGERS IV SOLN: INTRAVENOUS | Status: AC

## 2022-01-31 MED ORDER — BUSPIRONE HCL 5 MG PO TABS
15.0000 mg | ORAL_TABLET | Freq: Two times a day (BID) | ORAL | Status: DC
  Administered 2022-01-31 – 2022-02-01 (×3): 15 mg via ORAL
  Filled 2022-01-31 (×3): qty 3

## 2022-01-31 MED ORDER — GABAPENTIN 300 MG PO CAPS
600.0000 mg | ORAL_CAPSULE | Freq: Two times a day (BID) | ORAL | Status: DC
  Administered 2022-01-31 – 2022-02-01 (×3): 600 mg via ORAL
  Filled 2022-01-31 (×3): qty 2

## 2022-01-31 MED ORDER — ARIPIPRAZOLE 5 MG PO TABS
5.0000 mg | ORAL_TABLET | Freq: Once | ORAL | Status: AC
  Administered 2022-01-31: 5 mg via ORAL
  Filled 2022-01-31: qty 1

## 2022-01-31 NOTE — Progress Notes (Signed)
?PROGRESS NOTE ? ? ? ?Lance Abbott  [id number] DOB: 05-Jan-1981 DOA: 01/28/2022 ?PCP: Pcp, No  ? ?Brief Narrative:  ?HPI per Dr. Loma Boston on 01/28/22 ? Lance Abbott is a 41 y.o. male with medical history significant of hypertension, bipolar 1 disorder, chronic pain, history of polysubstance abuse including alcohol, cocaine, amphetamines, marijuana.  Patient initially brought to the hospital by EMS for chest pain.  They picked him up at a gas station.  He admits to taking Adderall and smoking marijuana and taking cocaine yesterday.  Patient became extremely paranoid during triage and began to leave the hospital.  At 1 point, the patient mentioned taking these drugs to end his life.  As the patient was felt he was in danger to his self the EDP involuntarily committed the patient and the patient was retrieved by [facility] police.  The patient states that he is extremely anxious and feels like his chest pain is due to his anxiousness.  At one point, the patient was chest pain-free.  On his return, the patient received Geodon, Ativan, and morphine.  The patient has been much calmer since that time. ?  ?The patient was initially tachycardic to the 140s.  Work-up shows a creatinine of 2.2.  His troponin was of 100 and a repeat of 94.  Cardiology was consulted who feels that this is not ACS but likely demand ischemia due to his extreme tachycardia and polysubstance abuse. ? ?**Interim History ?Patient has been intermittently agitated and confused and he was IVC last night.  He continues to have a tachycardia in the setting of his amphetamine use but echocardiogram has been normal.  He currently has no signs and symptoms of infection.  He has significant psychiatric issues and was found to be making weapons 1 with a toothbrush and 1 with part of the bed.  We will discontinue his telemetry monitoring given concern for the patient's safety.  Currently awaiting TTS evaluation given that he is improved from medical  standpoint given his AKI being improved significantly and WBC trending down. ? ?Medical standpoint his labs are improved significantly but patient is now continuing to see things and hallucinating.  TTS was called and they feel that he is not a danger to himself or anybody else.  Given that he continues to have hallucinations we will continue to treat him for withdrawals however it is less likely related to alcohol and more related to his methamphetamine usage.  Psychiatry has made some recommendation changes which have been implemented for mood stabilization.  They are recommending changing BuSpar to BID rather than TID, Abilify 15 mg po TID; D/C Wellbutrin; D/C Trazodone; Change Neurontin to 600 mg po BID  ? ? ?Assessment and Plan: ?No notes have been filed under this hospital service. ?Service: Hospitalist ? ?AKI, improved and resolved  ?-Observation ?-IV fluids overnight and given a bolus ?-Patient's BUNs/creatinine went from 22/2.10 -> 16/1.05 -> 12/0.92 on last check and was not checked this morning ?-Currently still getting on fluid hydration we will cut down to 75 mL per hour for 1 more day ?-Avoid further nephrotoxic medications, contrast dyes, hypotension and renally dose medications ?-Repeat CMP in a.m. ?  ?Hypokalemia ?-Replete and improved ?-Potassium was 3.2 and improved to 3.8 on last check and was not repeated this morning will repeat in the morning ?-Continue to monitor and replete as necessary ?-Repeat CMP in the AM  ?  ?Polysubstance abuse including cocaine use, amphetamine use, alcohol use ?-CIWA protocol initiated and he is still  scoring high on his CIWA scores so we will continue with the lorazepam ?-Echo for tachycardia and elevated troponin was relatively unremarkable: Tachycardia has improved ?-Will give labetalol for tachycardia related to amphetamine and cocaine use (studies have shown that it is safe for patient with amphetamine and cocaine use due to mixed beta/alpha blocker profile).   ?-UDS was positive for amphetamines, benzodiazepines, opiates and THC ?-Currently has mood disorder and psychiatry is following ?-Substance Counseling to be given ?  ?Leukocytosis ?-No evidence of acute infection and urinalysis is negative ?-WBC went from 19.3 -> 11.8 -> 10.4 on last check ?-There are studies showing leukocytosis after amphetamine and cocaine use. ?-Repeat CBCin the AM ?  ?Bipolar disorder with suicidal ideation and schizophrenia as well as mania and delusions ?-Had increased and ingestion of more than is recommended Adderall and marijuana along with cocaine and alcohol but no cocaine was found on UDS; upon further review he is not prescribed the Ritalin the clonazepam, the Valium, the hydrocodone-acetaminophen ?-IVC ?-Consulted psych when medically cleared; psychiatry evaluation done and they feel that he has no SI or HI and they have cleared him from a psychiatric standpoint however he continues to still hallucinate ?-Sitter at bedside ?-He coontinues to be a little manic and psychotic and given safety concerns for staff given that he is finding things to try and make weapons with the day before yesterday and yesterday he thought he was [place].  He is calmer today and psychiatry feels that he is stable from their standpoint and they recommend changing some medications but recommended continuing keeping his IVC today ? ?Sinus Tachycardia ?-In setting of methamphetamine use ?-IV fluid hydration has now stopped ?-Patient's heart rate has significantly improved and is in the 80s now ?  ?Hypertension ?-Continue Amlodipine now that patient is no longer Hypotensive ?-Continue to Monitor BP per Protocol ?-Last BP reading is now 120/70 ?  ?Normocytic anemia ?-Patient's hemoglobin/hematocrit went from 14.8/42.3 and is now 12.6/36.9 yesterday and today is 12.4/38.2 ?-Likely dilutional drop from IV fluid resuscitation ?-Patient to have outpatient anemia panel ?-Continue monitor and trend and continue monitor  for signs and symptoms of bleeding ?-Repeat CBC in a.m. ?  ?Hypophosphatemia ?-Patient's Phos level was 2.2 ?-Replete with p.o. K-Phos Neutral 500 mg x 1 ?-Continue monitor and replete as necessary ?-Repeat phosphorus level in the morning ?  ?Elevated AST ?-Mild in the setting of his alcohol use and drug use ?-AST and ALT were normal on admission with 39 and 27 respectively.  AST is now 74 and ALT is 34 ?-Continue monitor and trend intermittently but expect to improve and if not improving will consider right upper quadrant ultrasound as well as acute hepatitis panel. ?-Repeat CMP in the morning ?  ?Back abrasions ?-In the setting of getting thrown to the ground by police and being on the floor ?-Wound nurse consulted and they are recommending as needed moisturizing with Eucerin cream ? ?DVT prophylaxis: enoxaparin (LOVENOX) injection 40 mg Start: 01/29/22 1000 ? ?  Code Status: Full Code ?Family Communication: No family currently at bedside and there is nobody listed in his chart ? ?Disposition Plan:  ?Level of care: Med-Surg ?Status is: Inpatient ?Remains inpatient appropriate because: Continues to be agitated and hearing voices and hallucinating  ? ?Consultants:  ?Psychiatry/TTS ?Cardiology ? ?Procedures:  ?ECHOCARDIOGRAM ?IMPRESSIONS  ? ? ? 1. Left ventricular ejection fraction, by estimation, is 60 to 65%. The  ?left ventricle has normal function. The left ventricle has no regional  ?wall motion  abnormalities. There is mild left ventricular hypertrophy.  ?Left ventricular diastolic parameters  ?were normal.  ? 2. Right ventricular systolic function is normal. The right ventricular  ?size is normal. Tricuspid regurgitation signal is inadequate for assessing  ?PA pressure.  ? 3. The mitral valve is normal in structure. No evidence of mitral valve  ?regurgitation. No evidence of mitral stenosis.  ? 4. The aortic valve is tricuspid. Aortic valve regurgitation is not  ?visualized. No aortic stenosis is present.  ? 5.  The inferior vena cava is normal in size with greater than 50%  ?respiratory variability, suggesting right atrial pressure of 3 mmHg.  ? ?FINDINGS  ? Left Ventricle: Left ventricular ejection fraction, by estim

## 2022-01-31 NOTE — Plan of Care (Signed)
  Problem: Education: Goal: Knowledge of General Education information will improve Description Including pain rating scale, medication(s)/side effects and non-pharmacologic comfort measures Outcome: Progressing   

## 2022-01-31 NOTE — [facility] Counselor (Signed)
TTS attempted to client via sending a secure chat and calling [facility], no answer on the secure chat and no answer when transferred to the patient's floor/nurse. TTS will attempt to see patient later this date.  ?

## 2022-01-31 NOTE — Consult Note (Signed)
WOC Nurse Consult Note: ?Reason for Consult:back abrasions ?Wound type:trauma ?Pressure Injury POA: N/A ? ?I have discussed my  recommendations for the care of the abrasions with a topical emollient, such as Eucerin cream via Secure Chat with Dr. Alfredia Ferguson and he is in agreement with that POC. ? ?Guidance is provided for nursing although at this time it is noted that only essential medications are being given as patient is agitated. ? ?[facility] nursing team will not follow, but will remain available to this patient, the nursing and medical teams.  Please re-consult if needed. ?Thanks, ?Maudie Flakes, MSN, RN, [employer], [place], [facility], [place]  ?Pager# [phone]  ? ?  ?
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
## 2022-02-01 LAB — COMPREHENSIVE METABOLIC PANEL
ALT: 31 U/L (ref 0–44)
AST: 32 U/L (ref 15–41)
Albumin: 3.9 g/dL (ref 3.5–5.0)
Alkaline Phosphatase: 67 U/L (ref 38–126)
Anion gap: 7 (ref 5–15)
BUN: 12 mg/dL (ref 6–20)
CO2: 25 mmol/L (ref 22–32)
Calcium: 9.1 mg/dL (ref 8.9–10.3)
Chloride: 107 mmol/L (ref 98–111)
Creatinine, Ser: 1.03 mg/dL (ref 0.61–1.24)
GFR, Estimated: >60 mL/min (ref >60)
Glucose, Bld: 89 mg/dL (ref 70–99)
Potassium: 4.3 mmol/L (ref 3.5–5.1)
Sodium: 139 mmol/L (ref 135–145)
Total Bilirubin: 0.4 mg/dL (ref 0.3–1.2)
Total Protein: 6.6 g/dL (ref 6.5–8.1)

## 2022-02-01 LAB — CBC WITH DIFFERENTIAL/PLATELET
Abs Immature Granulocytes: 0.04 10*3/uL (ref 0.00–0.07)
Basophils Absolute: 0.1 10*3/uL (ref 0.0–0.1)
Basophils Relative: 1 %
Eosinophils Absolute: 0.1 10*3/uL (ref 0.0–0.5)
Eosinophils Relative: 1 %
HCT: 42.5 % (ref 39.0–52.0)
Hemoglobin: 13.9 g/dL (ref 13.0–17.0)
Immature Granulocytes: 0 %
Lymphocytes Relative: 12 %
Lymphs Abs: 1.1 10*3/uL (ref 0.7–4.0)
MCH: 30.3 pg (ref 26.0–34.0)
MCHC: 32.7 g/dL (ref 30.0–36.0)
MCV: 92.6 fL (ref 80.0–100.0)
Monocytes Absolute: 0.9 10*3/uL (ref 0.1–1.0)
Monocytes Relative: 10 %
Neutro Abs: 7 10*3/uL (ref 1.7–7.7)
Neutrophils Relative %: 76 %
Platelets: 247 10*3/uL (ref 150–400)
RBC: 4.59 MIL/uL (ref 4.22–5.81)
RDW: 13.3 % (ref 11.5–15.5)
WBC: 9.1 10*3/uL (ref 4.0–10.5)
nRBC: 0 % (ref 0.0–0.2)

## 2022-02-01 LAB — RESP PANEL BY RT-PCR (FLU A&B, COVID) ARPGX2
Influenza A by PCR: NEGATIVE
Influenza B by PCR: NEGATIVE
SARS Coronavirus 2 by RT PCR: NEGATIVE

## 2022-02-01 LAB — PHOSPHORUS: Phosphorus: 4.5 mg/dL (ref 2.5–4.6)

## 2022-02-01 LAB — MAGNESIUM: Magnesium: 1.9 mg/dL (ref 1.7–2.4)

## 2022-02-01 MED ORDER — ZIPRASIDONE MESYLATE 20 MG IM SOLR
20.0000 mg | Freq: Once | INTRAMUSCULAR | Status: AC
  Administered 2022-02-01: 20 mg via INTRAMUSCULAR
  Filled 2022-02-01: qty 20

## 2022-02-01 MED ORDER — LORAZEPAM 1 MG PO TABS
2.0000 mg | ORAL_TABLET | Freq: Once | ORAL | Status: DC
Start: 2022-02-01 — End: 2022-02-02

## 2022-02-01 MED ORDER — FOLIC ACID 1 MG PO TABS: 1.0000 mg | ORAL_TABLET | Freq: Every day | ORAL | 0 refills | Status: DC

## 2022-02-01 MED ORDER — LORAZEPAM 2 MG/ML IJ SOLN
1.0000 mg | INTRAMUSCULAR | Status: DC | PRN
  Administered 2022-02-01 (×2): 4 mg via INTRAVENOUS
  Administered 2022-02-01 (×2): 2 mg via INTRAVENOUS
  Administered 2022-02-01: 3 mg via INTRAVENOUS
  Administered 2022-02-02: 4 mg via INTRAVENOUS
  Administered 2022-02-02: 2 mg via INTRAVENOUS
  Filled 2022-02-01: qty 2
  Filled 2022-02-01 (×2): qty 1
  Filled 2022-02-01 (×3): qty 2
  Filled 2022-02-01: qty 1

## 2022-02-01 MED ORDER — BUSPIRONE HCL 15 MG PO TABS: 15.0000 mg | ORAL_TABLET | Freq: Two times a day (BID) | ORAL | Status: DC

## 2022-02-01 MED ORDER — QUETIAPINE FUMARATE 100 MG PO TABS: 100.0000 mg | ORAL_TABLET | Freq: Every day | ORAL | Status: DC

## 2022-02-01 MED ORDER — GABAPENTIN 300 MG PO CAPS
600.0000 mg | ORAL_CAPSULE | Freq: Two times a day (BID) | ORAL | Status: DC
Start: 2022-02-01 — End: 2022-02-08

## 2022-02-01 MED ORDER — QUETIAPINE FUMARATE 100 MG PO TABS
100.0000 mg | ORAL_TABLET | Freq: Every day | ORAL | Status: DC
  Administered 2022-02-01: 100 mg via ORAL
  Filled 2022-02-01: qty 1

## 2022-02-01 MED ORDER — LORAZEPAM 2 MG/ML IJ SOLN: 2.0000 mg | Freq: Once | INTRAMUSCULAR | Status: DC

## 2022-02-01 MED ORDER — HYDROCERIN EX CREA: 1.0000 "application " | TOPICAL_CREAM | Freq: Every day | CUTANEOUS | 0 refills | Status: DC

## 2022-02-01 MED ORDER — ACETAMINOPHEN 500 MG PO TABS: 500.0000 mg | ORAL_TABLET | Freq: Four times a day (QID) | ORAL | 0 refills | Status: DC | PRN

## 2022-02-01 MED ORDER — LORAZEPAM 1 MG PO TABS: 1.0000 mg | ORAL_TABLET | ORAL | Status: DC | PRN

## 2022-02-01 MED ORDER — ADULT MULTIVITAMIN W/MINERALS CH: 1.0000 | ORAL_TABLET | Freq: Every day | ORAL | 0 refills | Status: DC

## 2022-02-01 MED ORDER — THIAMINE HCL 100 MG PO TABS: 100.0000 mg | ORAL_TABLET | Freq: Every day | ORAL | 0 refills | Status: DC
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
## 2022-02-01 NOTE — TOC Transition Note (Signed)
Transition of Care (TOC) - CM/SW Discharge Note ? ? ?Patient Details  ?Name: Lance Abbott ?MRN: [id number] ?Date of Birth: [birth year] ? ?Transition of Care (TOC) CM/SW Contact:  ?Villa Herb, LCSWA ?Phone Number: ?02/01/2022, 3:21 PM ? ? ?Clinical Narrative:    ?Pt is high risk for readmission. TOC consulted for substance use resources. CSW provided resources to pts nurse tech as pt was sleeping. TOC signing off.  ? ?Final next level of care: Home/Self Care ?Barriers to Discharge: Barriers Resolved ? ? ?Patient Goals and CMS Choice ?Patient states their goals for this hospitalization and ongoing recovery are:: Return home ?  ?  ? ?Discharge Placement ?  ?           ?  ?  ?  ?  ? ?Discharge Plan and Services ?  ?  ?           ?  ?  ?  ?  ?  ?  ?  ?  ?  ?  ? ?Social Determinants of Health (SDOH) Interventions ?  ? ? ?Readmission Risk Interventions ? ?  02/01/2022  ?  3:21 PM  ?Readmission Risk Prevention Plan  ?Transportation Screening Complete  ?Home Care Screening Complete  ?Medication Review (RN CM) Complete  ? ? ? ? ? ?

## 2022-02-01 NOTE — Progress Notes (Signed)
Staff nurse walking pass heard IV beeping, on assessment IV occluded white substance found in tubing, sister stated he was fumbling around under the covers,.Charge Nurse and MD notified. Will monitor patient closely when administering po medications.  ?

## 2022-02-01 NOTE — Progress Notes (Signed)
Patient very agitated walking around room using profanity, security called, patient sitting on bed upset that he cannot leave, educated patient on his IVC status, patient stated doctor told him yesterday that he can go, security explained to him as well, voices his understanding but yet using profanity. After several verbal threats patient finally lays down. Will continue to follow care plan.  ?

## 2022-02-01 NOTE — Progress Notes (Signed)
This nurse and other staff found pt outside of his room being aggressive to staff and exit seeking. Pt ran up the hall trying to leave and go home. Pt also communicated threats of violence toward staff because he couldn't leave. This nurse and other staff was able to redirect pt back to his room where he pulled his IV out. Pt scored 33 on CIWA scale. MD was made aware via [facility] page. Geodon was ordered and administered. Will continue to monitor pt. ?

## 2022-02-01 NOTE — Discharge Summary (Signed)
?Physician Discharge Summary ?  ?Patient: Lance Abbott MRN: [id number] DOB: 08/03/1981  ?Admit date:     01/28/2022  ?Discharge date: 02/01/22  ?Discharge Physician: Raiford Noble, DO  ? ?PCP: Pcp, No  ? ?Recommendations at discharge:  ? ? Further Care per Psychiatry  ? ?Discharge Diagnoses: ?Principal Problem: ?  AKI (acute kidney injury) ([place]) ?Active Problems: ?  Alcohol use disorder, severe, in early remission ([place]) ?  Essential hypertension ?  Substance abuse ([place]) ?  Bipolar I disorder, single manic episode, severe, with psychosis ([place]) ?  History of alcohol abuse ?  Psychosis ([place]) ?  Leukocytosis ? ?Resolved Problems: ?  * No resolved hospital problems. * ? ?Hospital Course: ?HPI per Dr. Loma Boston on 01/28/22 ? Ihsan Rogas is a 41 y.o. male with medical history significant of hypertension, bipolar 1 disorder, chronic pain, history of polysubstance abuse including alcohol, cocaine, amphetamines, marijuana.  Patient initially brought to the hospital by EMS for chest pain.  They picked him up at a gas station.  He admits to taking Adderall and smoking marijuana and taking cocaine yesterday.  Patient became extremely paranoid during triage and began to leave the hospital.  At 1 point, the patient mentioned taking these drugs to end his life.  As the patient was felt he was in danger to his self the EDP involuntarily committed the patient and the patient was retrieved by [facility] police.  The patient states that he is extremely anxious and feels like his chest pain is due to his anxiousness.  At one point, the patient was chest pain-free.  On his return, the patient received Geodon, Ativan, and morphine.  The patient has been much calmer since that time. ?  ?The patient was initially tachycardic to the 140s.  Work-up shows a creatinine of 2.2.  His troponin was of 100 and a repeat of 94.  Cardiology was consulted who feels that this is not ACS but likely demand ischemia due to his extreme tachycardia and  polysubstance abuse. ? ?**Interim History ?Patient has been intermittently agitated and confused and he was IVC last night.  He continues to have a tachycardia in the setting of his amphetamine use but echocardiogram has been normal.  He currently has no signs and symptoms of infection.  He has significant psychiatric issues and was found to be making weapons 1 with a toothbrush and 1 with part of the bed.  We will discontinue his telemetry monitoring given concern for the patient's safety.  Currently awaiting TTS evaluation given that he is improved from medical standpoint given his AKI being improved significantly and WBC trending down. ? ?From a Medical standpoint his labs are improved significantly but patient is now continuing to see things and hallucinating.  TTS was called and they feel that he is not a danger to himself or anybody else.  Given that he continues to have hallucinations we will continue to treat him for withdrawals however it is less likely related to alcohol and more related to his methamphetamine usage.  Psychiatry has made some recommendation changes which have been implemented for mood stabilization.  They are recommending changing BuSpar to BID rather than TID, Abilify 15 mg po TID; D/C Wellbutrin; D/C Trazodone; Change Neurontin to 600 mg po BID yesterday.  ? ?Today psychiatry has changed the medications again I have now discontinued Abilify and started the patient on Seroquel 100 mg p.o. daily and 400 nightly and after further reevaluation they feel that he would need inpatient psychiatric  hospitalization.  From a medical standpoint patient has been medically stable to be discharged to inpatient psychiatric care given that all his medical issues have been taking care of. ? ? ?Assessment and Plan: ?No notes have been filed under this hospital service. ?Service: Hospitalist ? ?AKI, improved and resolved  ?-Observation initially but became  ?-IV fluids overnight and given a  bolus ?-Patient's BUNs/creatinine went from 22/2.10 -> 16/1.05 -> 12/0.92 -> 12/1.03 on last check  ?-IVF Hydration now stopped ?-Avoid further nephrotoxic medications, contrast dyes, hypotension and renally dose medications ?-Will NOT Repeat CMP as he is Stable from a Medical Standpoint ?  ?Hypokalemia ?-Replete and improved ?-K+ 4.3  ?-Continue to monitor and replete as necessary ?-Will NOT Repeat CMP as he is Stable from a Medical Standpoint ?  ?Polysubstance abuse including cocaine use, amphetamine use, alcohol use ?-CIWA protocol initiated and he is still scoring high on his CIWA scores so we will continue with the lorazepam ?-Echo for tachycardia and elevated troponin was relatively unremarkable: Tachycardia has improved ?-Will give labetalol for tachycardia related to amphetamine and cocaine use (studies have shown that it is safe for patient with amphetamine and cocaine use due to mixed beta/alpha blocker profile).  ?-UDS was positive for amphetamines, benzodiazepines, opiates and THC ?-Currently has mood disorder and psychiatry is following and adjusting meds and making  ?-Substance Counseling to be given ?  ?Leukocytosis ?-No evidence of acute infection and urinalysis is negative ?-WBC went from 19.3 -> 11.8 -> 10.4 -> 9.4 on last check ?-There are studies showing leukocytosis after amphetamine and cocaine use. ?-Will NOT Repeat CBC as he is Stable from a Medical Standpoint ?  ?Bipolar disorder with suicidal ideation and schizophrenia as well as mania and delusions ?-Had increased and ingestion of more than is recommended Adderall and marijuana along with cocaine and alcohol but no cocaine was found on UDS; upon further review he is not prescribed the Ritalin the clonazepam, the Valium, the hydrocodone-acetaminophen ?-IVC ?-Consulted psych when medically cleared; psychiatry evaluation done and they feel that he has no SI or HI and they have cleared him from a psychiatric standpoint however he continues to  still hallucinate and talks to a person named "Fritz Pickerel" who is not there ?-Sitter at bedside ?-He continues to be a little manic and psychotic and given safety concerns for staff given that he is finding things to try and make weapons on the day of admission and the day before yesterday he thought he was [place].  He was calmer today and psychiatry feels that he is stable from their standpoint and they recommend changing some medications but recommended continuing keeping his IVC; patient overnight pocketing a pill in his mouth and then subsequently was found to have a white substance found in his IV tubing.  IV was removed.  He became more more agitated and became aggressive towards staff and communicated threats of violence towards the staff and continues to hallucinate.  He was given Geodon with improvement and psychiatry reevaluated and now have discontinued his Abilify 50 mg 3 times daily and initiated Seroquel 100 mg daily and continuing Seroquel 400 nightly as well as gabapentin 600 mg p.o. twice daily.  Upon further evaluation by psych they are now recommending inpatient psychiatric admission given that he is medically stable now.  He has been faxed out and will discharge once bed is available given that from a medical standpoint he has improved significantly ?  ?Sinus Tachycardia ?-In setting of methamphetamine use ?-IV fluid  hydration has now stopped ?-Patient's heart rate has significantly  ?  ?Hypertension ?-Continue Amlodipine now that patient is no longer Hypotensive ?-Continue to Monitor BP per Protocol ?-Last BP reading is now 120/84 ?  ?Normocytic anemia ?-Patient's hemoglobin/hematocrit went from 14.8/42.3 -> 12.6/36.9 -> 12.4/38.2 -> 13.9/42.5 ?-Likely dilutional drop from IV fluid resuscitation ?-Patient to have outpatient anemia panel ?-Continue monitor and trend and continue monitor for signs and symptoms of bleeding ?-Will NOT Repeat CBC as he is Stable from a Medical Standpoint ?   ?Hypophosphatemia ?-Patient's Phos level is now 4.5 ?-Continue monitor and replete as necessary ?-Repeat phosphorus level in the morning ?  ?Elevated AST, improved  ?-Mild in the setting of his alcohol use and drug use ?-AST is now 32 and
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
## 2022-02-01 NOTE — Progress Notes (Signed)
Patient has been faxed out per the request of Ricky Ala, NP. Patient meets [place] inpatient criteria per Ricky Ala, NP. Patient has been faxed out to the following facilities:  ? ? ?[facility]  [address] [place] [address] [phone]  ?[facility]  [address] [place] [address] [phone]  ?[facility]  [address], [place] [address] [id number]  ?[address] [place] [address] [phone]  ?[facility]  [place], [place] [address] [phone]  ?[facility]  [place], [place] [address] [phone]  ?[facility]  [address] [place] [address] [phone]  ?[place] [address] [place] [address] [phone]  ?[facility]  [address] [place] [address] [phone]  ?[facility]  [address] [place] [address] [phone]  ? ?Mariea Clonts, MSW, LCSW-A  ?11:23 AM 02/01/2022   ?

## 2022-02-02 ENCOUNTER — Other Ambulatory Visit: Payer: Self-pay

## 2022-02-02 ENCOUNTER — Encounter (HOSPITAL_COMMUNITY): Payer: Self-pay | Admitting: Psychiatry

## 2022-02-02 ENCOUNTER — Inpatient Hospital Stay (HOSPITAL_COMMUNITY)
Admission: AD | Admit: 2022-02-02 | Discharge: 2022-02-08 | DRG: 885 | Disposition: A | Discharge status: Home / Self Care | Payer: Federal, State, Local not specified - Other | Source: Intra-hospital | Attending: Emergency Medicine | Admitting: Emergency Medicine

## 2022-02-02 DIAGNOSIS — F15151 Other stimulant abuse with stimulant-induced psychotic disorder with hallucinations: Secondary | ICD-10-CM | POA: Diagnosis present

## 2022-02-02 DIAGNOSIS — Z885 Allergy status to narcotic agent status: Secondary | ICD-10-CM | POA: Diagnosis not present

## 2022-02-02 DIAGNOSIS — R4689 Other symptoms and signs involving appearance and behavior: Secondary | ICD-10-CM | POA: Diagnosis present

## 2022-02-02 DIAGNOSIS — F1220 Cannabis dependence, uncomplicated: Secondary | ICD-10-CM | POA: Diagnosis present

## 2022-02-02 DIAGNOSIS — Z79899 Other long term (current) drug therapy: Secondary | ICD-10-CM | POA: Diagnosis not present

## 2022-02-02 DIAGNOSIS — Z808 Family history of malignant neoplasm of other organs or systems: Secondary | ICD-10-CM

## 2022-02-02 DIAGNOSIS — F17210 Nicotine dependence, cigarettes, uncomplicated: Secondary | ICD-10-CM | POA: Diagnosis present

## 2022-02-02 DIAGNOSIS — F19951 Other psychoactive substance use, unspecified with psychoactive substance-induced psychotic disorder with hallucinations: Secondary | ICD-10-CM

## 2022-02-02 DIAGNOSIS — F1110 Opioid abuse, uncomplicated: Secondary | ICD-10-CM | POA: Diagnosis present

## 2022-02-02 DIAGNOSIS — Z888 Allergy status to other drugs, medicaments and biological substances status: Secondary | ICD-10-CM

## 2022-02-02 DIAGNOSIS — G8929 Other chronic pain: Secondary | ICD-10-CM | POA: Diagnosis present

## 2022-02-02 DIAGNOSIS — F15951 Other stimulant use, unspecified with stimulant-induced psychotic disorder with hallucinations: Secondary | ICD-10-CM | POA: Diagnosis present

## 2022-02-02 DIAGNOSIS — F302 Manic episode, severe with psychotic symptoms: Secondary | ICD-10-CM | POA: Diagnosis present

## 2022-02-02 MED ORDER — LORAZEPAM 0.5 MG PO TABS: 0.5000 mg | ORAL_TABLET | Freq: Two times a day (BID) | ORAL | Status: DC

## 2022-02-02 MED ORDER — QUETIAPINE FUMARATE 100 MG PO TABS
100.0000 mg | ORAL_TABLET | Freq: Three times a day (TID) | ORAL | Status: DC | PRN
  Administered 2022-02-04 – 2022-02-07 (×2): 100 mg via ORAL
  Filled 2022-02-02: qty 1

## 2022-02-02 MED ORDER — CLONIDINE HCL 0.1 MG PO TABS
0.1000 mg | ORAL_TABLET | Freq: Every day | ORAL | Status: DC
  Administered 2022-02-07: 0.1 mg via ORAL
  Filled 2022-02-02 (×2): qty 1

## 2022-02-02 MED ORDER — NAPROXEN 500 MG PO TABS
500.0000 mg | ORAL_TABLET | Freq: Two times a day (BID) | ORAL | Status: AC | PRN
  Administered 2022-02-02 – 2022-02-05 (×6): 500 mg via ORAL
  Filled 2022-02-02 (×9): qty 1

## 2022-02-02 MED ORDER — GABAPENTIN 400 MG PO CAPS
400.0000 mg | ORAL_CAPSULE | Freq: Three times a day (TID) | ORAL | Status: DC
  Administered 2022-02-02 – 2022-02-08 (×18): 400 mg via ORAL
  Filled 2022-02-02 (×11): qty 1
  Filled 2022-02-02: qty 21
  Filled 2022-02-02 (×5): qty 1
  Filled 2022-02-02: qty 21
  Filled 2022-02-02 (×5): qty 1
  Filled 2022-02-02: qty 21
  Filled 2022-02-02 (×2): qty 1

## 2022-02-02 MED ORDER — QUETIAPINE FUMARATE 100 MG PO TABS
100.0000 mg | ORAL_TABLET | Freq: Every day | ORAL | Status: DC
  Administered 2022-02-02 – 2022-02-08 (×7): 100 mg via ORAL
  Filled 2022-02-02 (×9): qty 1
  Filled 2022-02-02: qty 7
  Filled 2022-02-02 (×2): qty 1

## 2022-02-02 MED ORDER — LORAZEPAM 1 MG PO TABS
1.0000 mg | ORAL_TABLET | Freq: Two times a day (BID) | ORAL | Status: DC
Start: 2022-02-06 — End: 2022-02-02

## 2022-02-02 MED ORDER — CLONIDINE HCL 0.1 MG PO TABS
0.1000 mg | ORAL_TABLET | Freq: Four times a day (QID) | ORAL | Status: DC
  Administered 2022-02-02 – 2022-02-04 (×8): 0.1 mg via ORAL
  Filled 2022-02-02 (×12): qty 1

## 2022-02-02 MED ORDER — GABAPENTIN 600 MG PO TABS
600.0000 mg | ORAL_TABLET | Freq: Two times a day (BID) | ORAL | Status: DC
  Administered 2022-02-02: 600 mg via ORAL
  Filled 2022-02-02 (×6): qty 1

## 2022-02-02 MED ORDER — ZIPRASIDONE MESYLATE 20 MG IM SOLR
20.0000 mg | Freq: Two times a day (BID) | INTRAMUSCULAR | Status: DC | PRN
  Filled 2022-02-02: qty 20

## 2022-02-02 MED ORDER — GABAPENTIN 800 MG PO TABS
400.0000 mg | ORAL_TABLET | Freq: Three times a day (TID) | ORAL | Status: DC
  Filled 2022-02-02 (×2): qty 0.5

## 2022-02-02 MED ORDER — LORAZEPAM 0.5 MG PO TABS
0.5000 mg | ORAL_TABLET | Freq: Two times a day (BID) | ORAL | Status: DC
  Administered 2022-02-06: 0.5 mg via ORAL
  Filled 2022-02-02: qty 1

## 2022-02-02 MED ORDER — METHOCARBAMOL 500 MG PO TABS
500.0000 mg | ORAL_TABLET | Freq: Three times a day (TID) | ORAL | Status: AC | PRN
  Administered 2022-02-02 – 2022-02-06 (×6): 500 mg via ORAL
  Filled 2022-02-02 (×6): qty 1

## 2022-02-02 MED ORDER — CLONIDINE HCL 0.1 MG PO TABS
0.1000 mg | ORAL_TABLET | ORAL | Status: DC
  Administered 2022-02-05 – 2022-02-06 (×3): 0.1 mg via ORAL
  Filled 2022-02-02 (×4): qty 1

## 2022-02-02 MED ORDER — NICOTINE POLACRILEX 2 MG MT GUM
2.0000 mg | CHEWING_GUM | OROMUCOSAL | Status: DC | PRN
  Administered 2022-02-03 – 2022-02-08 (×31): 2 mg via ORAL
  Filled 2022-02-02 (×19): qty 1

## 2022-02-02 MED ORDER — TRAZODONE HCL 50 MG PO TABS
50.0000 mg | ORAL_TABLET | Freq: Every evening | ORAL | Status: DC | PRN
  Administered 2022-02-02 – 2022-02-07 (×6): 50 mg via ORAL
  Filled 2022-02-02 (×6): qty 1
  Filled 2022-02-02: qty 7

## 2022-02-02 MED ORDER — LORAZEPAM 1 MG PO TABS
2.0000 mg | ORAL_TABLET | ORAL | Status: AC | PRN
  Administered 2022-02-02: 2 mg via ORAL
  Filled 2022-02-02: qty 2

## 2022-02-02 MED ORDER — LORAZEPAM 1 MG PO TABS: 2.0000 mg | ORAL_TABLET | Freq: Three times a day (TID) | ORAL | Status: DC

## 2022-02-02 MED ORDER — LORAZEPAM 0.5 MG PO TABS: 0.5000 mg | ORAL_TABLET | Freq: Every day | ORAL | Status: DC

## 2022-02-02 MED ORDER — LORAZEPAM 1 MG PO TABS
2.0000 mg | ORAL_TABLET | Freq: Two times a day (BID) | ORAL | Status: AC
  Administered 2022-02-02 (×2): 2 mg via ORAL
  Filled 2022-02-02: qty 2

## 2022-02-02 MED ORDER — ZIPRASIDONE MESYLATE 20 MG IM SOLR: 20.0000 mg | Freq: Three times a day (TID) | INTRAMUSCULAR | Status: DC | PRN

## 2022-02-02 MED ORDER — HYDROXYZINE HCL 25 MG PO TABS
25.0000 mg | ORAL_TABLET | Freq: Three times a day (TID) | ORAL | Status: DC | PRN
  Administered 2022-02-02 – 2022-02-07 (×9): 25 mg via ORAL
  Filled 2022-02-02: qty 1
  Filled 2022-02-02: qty 10
  Filled 2022-02-02 (×8): qty 1

## 2022-02-02 MED ORDER — LORAZEPAM 1 MG PO TABS: 1.0000 mg | ORAL_TABLET | Freq: Two times a day (BID) | ORAL | Status: DC

## 2022-02-02 MED ORDER — MAGNESIUM HYDROXIDE 400 MG/5ML PO SUSP: 30.0000 mL | Freq: Every day | ORAL | Status: DC | PRN

## 2022-02-02 MED ORDER — LORAZEPAM 1 MG PO TABS
1.0000 mg | ORAL_TABLET | Freq: Four times a day (QID) | ORAL | Status: DC | PRN
  Administered 2022-02-02: 1 mg via ORAL
  Filled 2022-02-02: qty 1

## 2022-02-02 MED ORDER — ATORVASTATIN CALCIUM 40 MG PO TABS
40.0000 mg | ORAL_TABLET | Freq: Every day | ORAL | Status: DC
  Administered 2022-02-03 – 2022-02-08 (×6): 40 mg via ORAL
  Filled 2022-02-02 (×10): qty 1

## 2022-02-02 MED ORDER — LORAZEPAM 1 MG PO TABS
1.0000 mg | ORAL_TABLET | Freq: Four times a day (QID) | ORAL | Status: DC | PRN
  Filled 2022-02-02: qty 2

## 2022-02-02 MED ORDER — LORAZEPAM 1 MG PO TABS: 2.0000 mg | ORAL_TABLET | Freq: Four times a day (QID) | ORAL | Status: DC

## 2022-02-02 MED ORDER — ARIPIPRAZOLE 15 MG PO TABS
15.0000 mg | ORAL_TABLET | Freq: Every day | ORAL | Status: DC
  Administered 2022-02-03 – 2022-02-08 (×6): 15 mg via ORAL
  Filled 2022-02-02 (×2): qty 1
  Filled 2022-02-02: qty 7
  Filled 2022-02-02 (×5): qty 1

## 2022-02-02 MED ORDER — BUSPIRONE HCL 15 MG PO TABS
15.0000 mg | ORAL_TABLET | Freq: Two times a day (BID) | ORAL | Status: DC
  Administered 2022-02-02: 15 mg via ORAL
  Filled 2022-02-02 (×6): qty 1

## 2022-02-02 MED ORDER — LORAZEPAM 1 MG PO TABS
2.0000 mg | ORAL_TABLET | Freq: Two times a day (BID) | ORAL | Status: AC
  Administered 2022-02-05 (×2): 2 mg via ORAL
  Filled 2022-02-02 (×2): qty 2

## 2022-02-02 MED ORDER — LOPERAMIDE HCL 2 MG PO CAPS
2.0000 mg | ORAL_CAPSULE | ORAL | Status: AC | PRN
  Administered 2022-02-04: 4 mg via ORAL
  Filled 2022-02-02: qty 2

## 2022-02-02 MED ORDER — ONDANSETRON 4 MG PO TBDP: 4.0000 mg | ORAL_TABLET | Freq: Four times a day (QID) | ORAL | Status: AC | PRN

## 2022-02-02 MED ORDER — ALUM & MAG HYDROXIDE-SIMETH 200-200-20 MG/5ML PO SUSP
30.0000 mL | ORAL | Status: DC | PRN
Start: 2022-02-02 — End: 2022-02-08

## 2022-02-02 MED ORDER — ACETAMINOPHEN 325 MG PO TABS
650.0000 mg | ORAL_TABLET | Freq: Four times a day (QID) | ORAL | Status: DC | PRN
  Administered 2022-02-02 – 2022-02-08 (×10): 650 mg via ORAL
  Filled 2022-02-02 (×9): qty 2

## 2022-02-02 MED ORDER — LORAZEPAM 1 MG PO TABS
2.0000 mg | ORAL_TABLET | Freq: Three times a day (TID) | ORAL | Status: AC
  Administered 2022-02-04 (×3): 2 mg via ORAL
  Filled 2022-02-02 (×3): qty 2

## 2022-02-02 MED ORDER — LORAZEPAM 1 MG PO TABS: 2.0000 mg | ORAL_TABLET | Freq: Two times a day (BID) | ORAL | Status: DC

## 2022-02-02 MED ORDER — LORAZEPAM 0.5 MG PO TABS
0.5000 mg | ORAL_TABLET | Freq: Every day | ORAL | Status: DC
Start: 2022-02-08 — End: 2022-02-02

## 2022-02-02 MED ORDER — LORAZEPAM 2 MG/ML IJ SOLN: 2.0000 mg | Freq: Three times a day (TID) | INTRAMUSCULAR | Status: DC | PRN

## 2022-02-02 MED ORDER — QUETIAPINE FUMARATE 400 MG PO TABS
400.0000 mg | ORAL_TABLET | Freq: Every day | ORAL | Status: DC
Start: 2022-02-02 — End: 2022-02-08
  Administered 2022-02-02 – 2022-02-07 (×6): 400 mg via ORAL
  Filled 2022-02-02 (×6): qty 1
  Filled 2022-02-02: qty 7
  Filled 2022-02-02: qty 1

## 2022-02-02 MED ORDER — BUSPIRONE HCL 10 MG PO TABS
10.0000 mg | ORAL_TABLET | Freq: Three times a day (TID) | ORAL | Status: DC
Start: 2022-02-02 — End: 2022-02-03
  Administered 2022-02-02 – 2022-02-03 (×3): 10 mg via ORAL
  Filled 2022-02-02 (×8): qty 1

## 2022-02-02 MED ORDER — AMLODIPINE BESYLATE 10 MG PO TABS
10.0000 mg | ORAL_TABLET | Freq: Once | ORAL | Status: DC
  Filled 2022-02-02: qty 1

## 2022-02-02 MED ORDER — DICYCLOMINE HCL 20 MG PO TABS: 20.0000 mg | ORAL_TABLET | Freq: Four times a day (QID) | ORAL | Status: AC | PRN

## 2022-02-02 MED ORDER — LORAZEPAM 1 MG PO TABS
2.0000 mg | ORAL_TABLET | Freq: Four times a day (QID) | ORAL | Status: AC
  Administered 2022-02-03 – 2022-02-04 (×4): 2 mg via ORAL
  Filled 2022-02-02 (×4): qty 2

## 2022-02-02 MED ORDER — ZIPRASIDONE MESYLATE 20 MG IM SOLR
20.0000 mg | Freq: Three times a day (TID) | INTRAMUSCULAR | Status: DC | PRN
  Administered 2022-02-02 – 2022-02-06 (×5): 20 mg via INTRAMUSCULAR
  Filled 2022-02-02 (×5): qty 20

## 2022-02-02 NOTE — BHH Counselor (Signed)
CSW attempted to meet with this patient to complete PSA. Pt was sleeping and could not be woken. ? ?CSW will attempt assessment again at a later date/time. ? ? ? ?Ruthann Cancer MSW, LCSW ?Clincal Social Worker  ?[facility]  ?

## 2022-02-02 NOTE — Group Note (Signed)
Recreation Therapy Group Note ? ? ?Group Topic:Leisure Education  ?Group Date: 02/02/2022 ?Start Time: 1010 ?End Time: 1140 ?Facilitators: Aiman Noe, LRT,CTRS ?Location: [address] Day[address] ? ? ?Goal Area(s) Addresses:  ?Patient will review and complete packet supporting identification of healthy leisure and recreation activities.  ? ?Activity:  Patients were given packets to dealing with what leisure and the benefits it provides to the individual.  The packet also included leisure activities to help patients identify different types of leisure (social, creative, relaxation, etc), what activities can be done as leisure and identifying their personal views of leisure. ? ? ?Affect/Mood: Appropriate ?  ?Participation Quality: Independent ?  ? ?Clinical Observations/Individualized Feedback:  LRT provided pt a packet reviewing leisure and its impact on emotional states and personal fulfillment.  Patients were to complete packets in their rooms at their own pace due to not being able to leave rooms due to illnesses on unit. ? ? ?Plan: Continue to engage patient in RT group sessions 2-3x/week. ? ? ?Klare Criss, LRT,CTRS ?02/02/2022 1:47 PM ?

## 2022-02-02 NOTE — Tx Team (Signed)
Initial Treatment Plan ?02/02/2022 ?11:27 AM ?Lance Abbott ?[id number] ? ? ? ?PATIENT STRESSORS: ?Financial difficulties   ?Medication change or noncompliance   ?Occupational concerns   ?Substance abuse   ?Traumatic event   ? ? ?PATIENT STRENGTHS: ?Ability for insight  ?Communication skills  ?Financial means  ?Physical Health  ?Work skills  ? ? ?PATIENT IDENTIFIED PROBLEMS: ?Alterations in mood (Depression & agitation) "I fucking depressed, I lost my mom last month, my dad has cancer".  ?  ?Financial constraints "I'm in here without money. I'm missing work. I need to leave today".  ?  ?Medication noncompliance "I've been off my medicines for weeks now".  ?  ?  ?  ?  ?  ? ?DISCHARGE CRITERIA:  ?Improved stabilization in mood, thinking, and/or behavior ?Verbal commitment to aftercare and medication compliance ? ?PRELIMINARY DISCHARGE PLAN: ?Outpatient therapy ?Return to previous living arrangement ?Return to previous work or school arrangements ? ?PATIENT/FAMILY INVOLVEMENT: ?This treatment plan has been presented to and reviewed with the patient, [name]. The patient have been given the opportunity to ask questions and make suggestions. ? ?Keane Police, RN ?02/02/2022, 11:27 AM ?
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
## 2022-02-02 NOTE — Progress Notes (Signed)
When writer in to administer medicine patient requesting morphine and other medications not ordered. Writer had to obtain PCR swab and patient was cooperative but stated "I wish you could push the q tip through my brain, I hate my life." Writer reassured patient the nasal swab would not be that invasive and it was just being done again for him to be able to discharge to another facility. ? ?

## 2022-02-02 NOTE — Group Note (Signed)
LCSW Group Therapy Note ? ? ?Group Date: 02/02/2022 ?Start Time: 1300 ?End Time: 1400 ? ?  ?Topic: Sleep Hygiene ?  ?Participation: Did not attend; Patient was sleeping. Packet was left for patient in room. ?  ?Due to the illness on the unit, Infectious Disease has recommended no close contact at this time, therefore group was not held. Patient was provided therapeutic worksheets and asked to meet with CSW to discuss those worksheets as needed.   ? ? ?Otelia Santee, LCSW ?02/02/2022  1:20 PM   ? ?

## 2022-02-02 NOTE — Progress Notes (Signed)
?   02/02/22 2100  ?Psych Admission Type (Psych Patients Only)  ?Admission Status Involuntary  ?Psychosocial Assessment  ?Patient Complaints Agitation  ?Eye Contact Suspiciousness  ?Facial Expression Angry  ?Affect Anxious  ?Speech Argumentative  ?Interaction Attention-seeking;Arrogant;Childlike  ?Motor Activity Lethargic  ?Appearance/Hygiene Disheveled  ?Behavior Characteristics Cooperative  ?Mood Labile;Suspicious  ?Aggressive Behavior  ?Effect No apparent injury  ?Thought Process  ?Coherency Concrete thinking  ?Content Blaming others;Magical thinking;Paranoia  ?Delusions Paranoid  ?Perception Hallucinations  ?Hallucination Auditory;Visual  ?Judgment Impaired  ?Confusion None  ?Danger to Self  ?Current suicidal ideation? Denies  ?Danger to Others  ?Danger to Others None reported or observed  ? ? ?

## 2022-02-02 NOTE — Progress Notes (Signed)
Writer called report spoke with receiving nurse Nira Conn, RN, she stated she is aware of this patient has had him before. Patient transferring to behavioral health room 508 by [place] police dept. IVC paperwork given to transferring officer. IV removed without issues. Police dept at bedside at this time with Probation officer.  ?

## 2022-02-02 NOTE — BHH Suicide Risk Assessment (Addendum)
Suicide Risk Assessment ? ?Admission Assessment    ?[facility] Admission Suicide Risk Assessment ? ? ?Nursing information obtained from:  Patient ?Demographic factors:  Male, Caucasian, Low socioeconomic status, Living alone, Adolescent or young adult ?Current Mental Status:  Self-harm thoughts ?Loss Factors:  Financial problems / change in socioeconomic status, Loss of significant relationship, Decrease in vocational status ?Historical Factors:  Impulsivity ?Risk Reduction Factors:  Employed ? ?Total Time spent with patient: 1 hour ?Principal Problem: Substance-induced psychotic disorder with hallucinations (HCC) ?Diagnosis:  Principal Problem: ?  Substance-induced psychotic disorder with hallucinations (HCC) ? ?Subjective Data: Lance Abbott is a 41 yo patient w/ PPH of Bipolar disorder, Cluster B personality, and polysubstance abuse who presented to [facility] after transfer from [facility] were he was treated for AKI after presenting via EMS endorsing bizarre behavior in public.  Patient reports that he is not sure why he was transferred to the psychiatric hospital but is able to recall what led to his medical hospitalization. Patient reports that he believes he saw an underground bunker (which he describes in detail) and then took all of his clothes off and ran around naked because he thought a tracker was in his clothes. Patient reports that he also heard people screaming and heard bombs and believes that the [place] is in a civil war. Patient reports on assessment today that the [place] is in the mist of a civil war and what he saw and heard approx 4-5 days ago confirms this. Patient reports that he has not had any AVH today but does not believe what happened a few days ago was hallucinations. Patient reports that EMS picked him up after he ran to a store because he thought he was being chased. Patient reports that even today he still thinks someone may be out to get him and reports that it would likely be "the enemy." Patient does  not give further details. Patient reports that he likes to read his bible and that he is getting special messages from the bible that are also related to how he is special and the "civil war." Patient does endorse that he is a bit sad thinking about his mother's and his girlfriends deaths and reports that he has to take Wellbutrin so that he is less tearful. Patient reports that he will have daily passive SI. Patient reports "but I still cry all the time on the inside." Patient denies SI but endorses HI, although it is toward "anyone who tries to attack me first" and denies a plan. This appears more passive.  ? ?Continued Clinical Symptoms:  ?Alcohol Use Disorder Identification Test Final Score (AUDIT): 23 ?The "Alcohol Use Disorders Identification Test", Guidelines for Use in Primary Care, Second Edition.  World Science writer [facility]). ?Score between 0-7:  no or low risk or alcohol related problems. ?Score between 8-15:  moderate risk of alcohol related problems. ?Score between 16-19:  high risk of alcohol related problems. ?Score 20 or above:  warrants further diagnostic evaluation for alcohol dependence and treatment. ? ? ?CLINICAL FACTORS:  ? Currently Psychotic ? ? ?Musculoskeletal: ?Strength & Muscle Tone: within normal limits ?Gait & Station: normal ?Patient leans: N/A ? ?Psychiatric Specialty Exam: ? ?Presentation  ?General Appearance: Casual (wearing psych scrubs, looks sedated) ? ?Eye Contact:Minimal ? ?Speech:Clear and Coherent; Slow ? ?Speech Volume:Normal ? ?Handedness:Right ? ? ?Mood and Affect  ?Mood:Labile ? ?Affect:Restricted ? ? ?Thought Process  ?Thought Processes:Goal Directed ? ?Descriptions of Associations:Circumstantial ? ?Orientation:Partial (believes it is October, But gets year, place,  and to some degree can recall what let to his inital hospitalization) ? ?Thought Content:Delusions ? ?History of Schizophrenia/Schizoaffective disorder:Yes ? ?Duration of Psychotic Symptoms:Less than six  months ? ?Hallucinations:Hallucinations: None ? ?Ideas of Reference:Delusions; Paranoia ? ?Suicidal Thoughts:Suicidal Thoughts: No ? ?Homicidal Thoughts:Homicidal Thoughts: No ? ? ?Sensorium  ?Memory:Immediate Fair; Recent Fair; Remote Fair ? ?Judgment:Impaired ? ?Insight:Shallow ? ? ?Executive Functions  ?Concentration:Poor ? ?Attention Span:Poor ? ?Recall:Fair ? ?Fund of Knowledge:Fair ? ?Language:Fair ? ? ?Psychomotor Activity  ?Psychomotor Activity:Psychomotor Activity: Decreased ? ? ?Assets  ?Assets:Resilience; Housing ? ? ?Sleep  ?Sleep:Sleep: Fair ? ? ? ?Physical Exam: ?Physical Exam ?HENT:  ?   Head: Normocephalic and atraumatic.  ?Pulmonary:  ?   Effort: Pulmonary effort is normal.  ?Neurological:  ?   Mental Status: He is alert.  ? ?Review of Systems  ?Psychiatric/Behavioral:  Negative for hallucinations and suicidal ideas.   ?Blood pressure 124/87, pulse (!) 108, temperature 99.1 ?F (37.3 ?C), temperature source Oral, resp. rate 16, height 5\' 8"  (1.727 m), weight 88.5 kg, SpO2 100 %. Body mass index is 29.65 kg/m?. ? ? ?COGNITIVE FEATURES THAT CONTRIBUTE TO RISK:  ?None   ? ?SUICIDE RISK:  ? Moderate:  Frequent suicidal ideation with limited intensity, and duration, some specificity in terms of plans, no associated intent, good self-control, limited dysphoria/symptomatology, some risk factors present, and identifiable protective factors, including available and accessible social support. ? ? ?PLAN OF CARE: Admit due to psychosis and withdrawal. He needs crisis stabilization, safety monitoring and medication management.  ?   ? ?I certify that inpatient services furnished can reasonably be expected to improve the patient's condition.  ? ? ?PGY-2 ? , MD ?02/02/2022, 5:28 PM ? ? ?Total Time Spent in Direct Patient Care:  ?I personally spent 60 minutes on the unit in direct patient care. The direct patient care time included face-to-face time with the patient, reviewing the patient's chart,  communicating with other professionals, and coordinating care. Greater than 50% of this time was spent in counseling or coordinating care with the patient regarding goals of hospitalization, psycho-education, and discharge planning needs. ? ?I have independently evaluated the patient during a face-to-face assessment on 02/02/22. I reviewed the patient's chart, and I participated in key portions of the service. I discussed the case with the 02/04/22, and I agree with the assessment and plan of care as documented in the House Officer's note, as addended by me or notated below: ? ?I directly edited the SRA, as above. ?See my attestation of the H&P for information regarding assessment, diagnosis list, and plan.  ? ?[employer], MD ?Psychiatrist  ? ?

## 2022-02-02 NOTE — Progress Notes (Signed)
Pt got up requesting medications, pt only focused on his medications, pt presented with med seeking behaviors. Pt given HS medications per [facility] ?

## 2022-02-02 NOTE — Progress Notes (Signed)
Recreation Therapy Notes ? ?INPATIENT RECREATION THERAPY ASSESSMENT ? ?Patient Details ?Name: Lance Abbott ?MRN: [id number] ?DOB: Apr 15, 1981 ?Today's Date: 02/02/2022 ?      ?Information Obtained From: ?Patient ? ?Able to Participate in Assessment/Interview: ?Yes ? ?Patient Presentation: ?Responsive (Tired, Drowsy) ? ?Reason for Admission (Per Patient): ?Other (Comments) (Pt stated he was having a heart attack.) ? ?Patient Stressors: ?Other (Comment) ("being chased by people trying to kill me") ? ?Coping Skills:   ?Isolation, TV, Sports, Music, Exercise, Deep Breathing, Talk, Art, Prayer, Avoidance, Read, Hot Bath/Shower ? ?Leisure Interests (2+):  ?Games - [employer], Individual - Other (Comment) (Punching Bag; Walk Dog; Clean; Eat) ? ?Frequency of Recreation/Participation: ?Other (Comment) (Daily) ? ?Awareness of Community Resources:  ?No ? ?Expressed Interest in [employer] Information: ?No ? ?[place] of Residence:  ?[place] ? ?Patient Main Form of Transportation: ?Walk ? ?Patient Strengths:  ?Physical; Emotional; "Power of God that moves inside me" ? ?Patient Identified Areas of Improvement:  ?Spelling; Retaining Knowledge; Memorizing ? ?Patient Goal for Hospitalization:  ?"get well" ? ?Current SI (including self-harm):  ?Yes ? ?Current HI:  ?Yes ? ?Current AVH: ?Yes ? ?Staff Intervention Plan: ?Group Attendance, Collaborate with Interdisciplinary Treatment Team ? ?Consent to Intern Participation: ?N/A ? ? ?Victorino Sparrow, LRT,CTRS ?Victorino Sparrow A ?02/02/2022, 1:54 PM ?
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
## 2022-02-03 DIAGNOSIS — F19951 Other psychoactive substance use, unspecified with psychoactive substance-induced psychotic disorder with hallucinations: Secondary | ICD-10-CM | POA: Diagnosis not present

## 2022-02-03 MED ORDER — LORAZEPAM 2 MG/ML IJ SOLN
INTRAMUSCULAR | Status: AC
  Filled 2022-02-03: qty 1

## 2022-02-03 MED ORDER — LORAZEPAM 1 MG PO TABS: 1.0000 mg | ORAL_TABLET | Freq: Once | ORAL | Status: AC

## 2022-02-03 MED ORDER — BUSPIRONE HCL 15 MG PO TABS
15.0000 mg | ORAL_TABLET | Freq: Three times a day (TID) | ORAL | Status: DC
  Administered 2022-02-03 – 2022-02-08 (×14): 15 mg via ORAL
  Filled 2022-02-03 (×7): qty 1
  Filled 2022-02-03: qty 21
  Filled 2022-02-03: qty 1
  Filled 2022-02-03 (×2): qty 21
  Filled 2022-02-03 (×10): qty 1

## 2022-02-03 MED ORDER — LORAZEPAM 2 MG/ML IJ SOLN
1.0000 mg | Freq: Once | INTRAMUSCULAR | Status: AC
  Administered 2022-02-03: 1 mg via INTRAMUSCULAR
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
## 2022-02-03 NOTE — Progress Notes (Addendum)
Patient ID: Lance Abbott, male   DOB: 12/13/1980, 41 y.o.   MRN: [id number] ? ?Pt agitated on unit, threatening to throw hot coffee in nursing staffs' faces. Geodon 20 mg IM given. ?

## 2022-02-03 NOTE — Progress Notes (Signed)
Pt only focused on medication, pt took HS medications, but will not try to go to sleep knowing he has Ativan scheduled 0000  ? ? ? 02/03/22 2200  ?Psych Admission Type (Psych Patients Only)  ?Admission Status Involuntary  ?Psychosocial Assessment  ?Patient Complaints Anxiety  ?Eye Contact Suspiciousness  ?Facial Expression Angry  ?Affect Anxious  ?Speech Argumentative  ?Interaction Attention-seeking;Arrogant;Childlike  ?Motor Activity Lethargic  ?Appearance/Hygiene Disheveled  ?Behavior Characteristics Anxious;Agitated  ?Mood Labile;Preoccupied  ?Aggressive Behavior  ?Effect No apparent injury  ?Thought Process  ?Coherency Concrete thinking  ?Content Blaming others;Magical thinking;Paranoia  ?Delusions Paranoid  ?Perception Hallucinations  ?Hallucination Auditory;Visual  ?Judgment Impaired  ?Confusion None  ?Danger to Self  ?Current suicidal ideation? Denies  ?Danger to Others  ?Danger to Others None reported or observed  ? ? ?

## 2022-02-03 NOTE — Progress Notes (Signed)
?   02/03/22 0622  ?Sleep  ?Number of Hours 7.75  ? ? ?

## 2022-02-03 NOTE — [facility] Suicide Risk Assessment (Signed)
[facility] INPATIENT:  Family/Significant Other Suicide Prevention Education ? ?Suicide Prevention Education:  ?Patient Refusal for Family/Significant Other Suicide Prevention Education: The patient Lance Abbott has refused to provide written consent for family/significant other to be provided Family/Significant Other Suicide Prevention Education during admission and/or prior to discharge.  Physician notified. ? ? ? SPE completed with patient, as patient refused to consent to family contact. SPI pamphlet provided to pt and pt was encouraged to share information with support network, ask questions, and talk about any concerns relating to SPE. Patient denies access to guns/firearms and verbalized understanding of information provided. Mobile Crisis information also provided to patient.  ? ? ? ?Lance Abbott A Kelina Beauchamp ?02/03/2022, 10:27 AM ?
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
## 2022-02-03 NOTE — Progress Notes (Signed)
Pt refused labs.  

## 2022-02-03 NOTE — Plan of Care (Signed)
?  Problem: Coping: ?Goal: Ability to verbalize frustrations and anger appropriately will improve ?Outcome: Not Progressing ?  ?Problem: Coping: ?Goal: Ability to demonstrate self-control will improve ?Outcome: Not Progressing ?  ?Problem: Safety: ?Goal: Periods of time without injury will increase ?Outcome: Progressing ?  ?Problem: Coping: ?Goal: Will verbalize feelings ?Outcome: Progressing ?  ?Problem: Health Behavior/Discharge Planning: ?Goal: Compliance with prescribed medication regimen will improve ?Outcome: Progressing ?  ?

## 2022-02-03 NOTE — Group Note (Signed)
Recreation Therapy Group Note ? ? ?Group Topic:Health and Wellness  ?Group Date: 02/03/2022 ?Start Time: 1050 ?End Time: 1140 ?Facilitators: Caroll Rancher, LRT,CTRS ?Location: [address] Dayroom ? ? ?Goal Area(s) Addresses:  ?Patient will define components of whole wellness. ?Patient will verbalize benefit of whole wellness. ? ?Activity:  Music.  Due to the high acuity on the unit, LRT went room to room playing music for each patient in hopes it would calm the milieu and give patients a moment to relax, be calm and mental venture in a peaceful place in their minds.  ? ? ?Affect/Mood: Appropriate ?  ?Participation Level: Engaged ?  ?Participation Quality: Independent ?  ?Behavior: Appropriate ?  ? ?Clinical Observations/Individualized Feedback: Pt was engaged and attentive to the music.  Pt was singing along to the song he requested to be played.  Pt was appropriate and appeared to be enjoying himself.   ? ? ?Plan: Continue to engage patient in RT group sessions 2-3x/week. ? ? ?Caroll Rancher, LRT,CTRS ?02/03/2022 1:21 PM ?

## 2022-02-03 NOTE — Progress Notes (Signed)
?   02/03/22 1325  ?Psych Admission Type (Psych Patients Only)  ?Admission Status Involuntary  ?Psychosocial Assessment  ?Patient Complaints Anxiety;Agitation  ?Eye Contact Watchful;Fair;Vacant  ?Facial Expression Anxious  ?Affect Irritable  ?Speech Argumentative  ?Interaction Attention-seeking;Assertive;Needy  ?Motor Activity Slow  ?Appearance/Hygiene Disheveled;In scrubs  ?Behavior Characteristics Agitated;Fidgety;Irritable;Cooperative  ?Thought Process  ?Coherency Concrete thinking  ?Content Blaming others  ?Delusions Paranoid  ?Perception Hallucinations  ?Hallucination Auditory;Visual  ?Judgment Impaired  ?Confusion None  ?Danger to Self  ?Current suicidal ideation? Denies  ?Danger to Others  ?Danger to Others None reported or observed  ? ? ?

## 2022-02-04 ENCOUNTER — Encounter (HOSPITAL_COMMUNITY): Payer: Self-pay

## 2022-02-04 DIAGNOSIS — F19951 Other psychoactive substance use, unspecified with psychoactive substance-induced psychotic disorder with hallucinations: Secondary | ICD-10-CM | POA: Diagnosis not present

## 2022-02-04 LAB — LIPID PANEL
Cholesterol: 133 mg/dL (ref 0–200)
HDL: 36 mg/dL — ABNORMAL LOW (ref >40)
LDL Cholesterol: 65 mg/dL (ref 0–99)
Total CHOL/HDL Ratio: 3.7 RATIO
Triglycerides: 161 mg/dL — ABNORMAL HIGH (ref <150)
VLDL: 32 mg/dL (ref 0–40)

## 2022-02-04 LAB — TSH: TSH: 1.415 u[IU]/mL (ref 0.350–4.500)

## 2022-02-04 LAB — HEMOGLOBIN A1C
Hgb A1c MFr Bld: 5.1 % (ref 4.8–5.6)
Mean Plasma Glucose: 99.67 mg/dL

## 2022-02-04 MED ORDER — TRAZODONE HCL 50 MG PO TABS
50.0000 mg | ORAL_TABLET | Freq: Once | ORAL | Status: AC
  Administered 2022-02-04: 50 mg via ORAL
  Filled 2022-02-04: qty 1

## 2022-02-04 NOTE — Progress Notes (Signed)
Pt up on the unit requesting medication. "Can I have the Geodon shot, I like the way it makes me feel" ? ?Pt walking on the unit trying to work himself up so he can get a shot of Geodon. Pt is only having med seeking behaviors.  ?

## 2022-02-04 NOTE — Progress Notes (Signed)
Pt later stated he wanted the Geodon shot to help keep the voices down. Pt was given PRN Seroquel per MAR ?

## 2022-02-04 NOTE — Progress Notes (Signed)
?   02/04/22 0500  ?Sleep  ?Number of Hours 3.25  ? ? ?

## 2022-02-04 NOTE — Progress Notes (Signed)
pt continues  with med seeking behaviors, pt educated on taking medications when he doesn't need them, pt stated he likes the way the Geodon makes him feel, pt then stated it was to help with the voices. ? ? ? ? 02/04/22 2200  ?Psych Admission Type (Psych Patients Only)  ?Admission Status Involuntary  ?Psychosocial Assessment  ?Patient Complaints Anxiety;Other (Comment) ?(wants more meds)  ?Eye Contact Suspiciousness  ?Facial Expression Angry  ?Affect Anxious  ?Speech Argumentative  ?Interaction Attention-seeking;Arrogant;Childlike  ?Motor Activity Lethargic  ?Appearance/Hygiene Disheveled  ?Behavior Characteristics Cooperative  ?Mood Anxious;Suspicious  ?Aggressive Behavior  ?Effect No apparent injury  ?Thought Process  ?Coherency Concrete thinking  ?Content Blaming others;Magical thinking;Paranoia  ?Delusions Paranoid  ?Perception Hallucinations  ?Hallucination Auditory;Visual  ?Judgment Impaired  ?Confusion None  ?Danger to Self  ?Current suicidal ideation? Denies  ?Danger to Others  ?Danger to Others None reported or observed  ? ? ?

## 2022-02-04 NOTE — [facility] Group Notes (Signed)
?  Spirituality group facilitated by Kathleen Argue, BCC.  ? ?Group Description: Group focused on topic of hope. Patients participated in facilitated discussion around topic, connecting with one another around experiences and definitions for hope. Group members engaged with visual explorer photos, reflecting on what hope looks like for them today. Group engaged in discussion around how their definitions of hope are present today in hospital.  ? ?Modalities: Psycho-social ed, Adlerian, Narrative, MI  ? ?Patient Progress: Lance Abbott joined group towards the end.  He engaged in the conversation and was able to identify some things that give him hope.  He did show some signs of hyper-religiosity as he shared about some healing stories that he had experienced. ? ?[employer], Bcc ?Pager, [phone] ? ?

## 2022-02-04 NOTE — Group Note (Signed)
LCSW Group Therapy Note ? ? ?Group Date: 02/04/2022 ?Start Time: 1100 ?End Time: 1200 ? ? ?Type of Therapy and Topic:  Group Therapy: Problem Solving ? ?Participation Level:  Active ? ? ?Summary of Patient Progress: Due acuity on the unit group was not held. Patient was provided therapeutic worksheets and asked to meet with CSW to discuss those worksheets as needed.  ? ? ? ?Lem Peary A Dacoda Spallone, LCSW ?02/04/2022  2:25 PM   ? ?

## 2022-02-04 NOTE — BH IP Treatment Plan (Signed)
Interdisciplinary Treatment and Diagnostic Plan Update ? ?02/04/2022 ?Time of Session: 10:05am ?Lance Abbott ?MRN: [id number] ? ?Principal Diagnosis: Substance-induced psychotic disorder with hallucinations (HCC) ? ?Secondary Diagnoses: Principal Problem: ?  Substance-induced psychotic disorder with hallucinations (HCC) ? ? ?Current Medications:  ?Current Facility-Administered Medications  ?Medication Dose Route Frequency Provider Last Rate Last Admin  ? acetaminophen (TYLENOL) tablet 650 mg  650 mg Oral Q6H PRN Oneta RackLewis, Tanika N, NP   650 mg at 02/04/22 0804  ? alum & mag hydroxide-simeth (MAALOX/MYLANTA) 200-200-20 MG/5ML suspension 30 mL  30 mL Oral Q4H PRN Oneta RackLewis, Tanika N, NP      ? amLODipine (NORVASC) tablet 10 mg  10 mg Oral Once Oneta RackLewis, Tanika N, NP      ? ARIPiprazole (ABILIFY) tablet 15 mg  15 mg Oral Daily Massengill, Nathan, MD   15 mg at 02/04/22 0802  ? atorvastatin (LIPITOR) tablet 40 mg  40 mg Oral Daily Oneta RackLewis, Tanika N, NP   40 mg at 02/04/22 0802  ? busPIRone (BUSPAR) tablet 15 mg  15 mg Oral Q8H Massengill, Nathan, MD   15 mg at 02/04/22 0602  ? cloNIDine (CATAPRES) tablet 0.1 mg  0.1 mg Oral QID Eliseo GumMcQuilla, Jai B, MD   0.1 mg at 02/04/22 0802  ? Followed by  ? [START ON 02/05/2022] cloNIDine (CATAPRES) tablet 0.1 mg  0.1 mg Oral BH-qamhs Eliseo GumMcQuilla, Jai B, MD      ? Followed by  ? Melene Muller[START ON 02/07/2022] cloNIDine (CATAPRES) tablet 0.1 mg  0.1 mg Oral QAC breakfast Eliseo GumMcQuilla, Jai B, MD      ? dicyclomine (BENTYL) tablet 20 mg  20 mg Oral Q6H PRN Eliseo GumMcQuilla, Jai B, MD      ? gabapentin (NEURONTIN) capsule 400 mg  400 mg Oral Q8H Singleton, Amy E, MD   400 mg at 02/04/22 [id number]  ? hydrOXYzine (ATARAX) tablet 25 mg  25 mg Oral TID PRN Oneta RackLewis, Tanika N, NP   25 mg at 02/04/22 [id number]  ? loperamide (IMODIUM) capsule 2-4 mg  2-4 mg Oral PRN Bobbye MortonMcQuilla, Jai B, MD   4 mg at 02/04/22 0802  ? LORazepam (ATIVAN) injection 2 mg  2 mg Intramuscular TID PRN Phineas InchesMassengill, Nathan, MD      ? Melene Muller[START ON 02/06/2022] LORazepam (ATIVAN) tablet 0.5  mg  0.5 mg Oral BID Eliseo GumMcQuilla, Jai B, MD      ? Melene Muller[START ON 02/07/2022] LORazepam (ATIVAN) tablet 0.5 mg  0.5 mg Oral Daily Eliseo GumMcQuilla, Jai B, MD      ? LORazepam (ATIVAN) tablet 1 mg  1 mg Oral Q6H PRN Massengill, Harrold DonathNathan, MD      ? LORazepam (ATIVAN) tablet 2 mg  2 mg Oral Q8H Massengill, Harrold DonathNathan, MD   2 mg at 02/04/22 0602  ? [START ON 02/05/2022] LORazepam (ATIVAN) tablet 2 mg  2 mg Oral Q12H Massengill, Nathan, MD      ? magnesium hydroxide (MILK OF MAGNESIA) suspension 30 mL  30 mL Oral Daily PRN Oneta RackLewis, Tanika N, NP      ? methocarbamol (ROBAXIN) tablet 500 mg  500 mg Oral Q8H PRN Eliseo GumMcQuilla, Jai B, MD   500 mg at 02/04/22 [id number]  ? naproxen (NAPROSYN) tablet 500 mg  500 mg Oral BID PRN Eliseo GumMcQuilla, Jai B, MD   500 mg at 02/04/22 1015  ? nicotine polacrilex (NICORETTE) gum 2 mg  2 mg Oral PRN Phineas InchesMassengill, Nathan, MD   2 mg at 02/04/22 [id number]  ? ondansetron (ZOFRAN-ODT) disintegrating tablet 4 mg  4 mg Oral  Q6H PRN Bobbye Morton, MD      ? QUEtiapine (SEROQUEL) tablet 100 mg  100 mg Oral Daily Oneta Rack, NP   100 mg at 02/04/22 5027  ? And  ? QUEtiapine (SEROQUEL) tablet 400 mg  400 mg Oral QHS Oneta Rack, NP   400 mg at 02/03/22 2040  ? QUEtiapine (SEROQUEL) tablet 100 mg  100 mg Oral Q8H PRN Massengill, Nathan, MD      ? traZODone (DESYREL) tablet 50 mg  50 mg Oral QHS PRN Oneta Rack, NP   50 mg at 02/03/22 2040  ? ziprasidone (GEODON) injection 20 mg  20 mg Intramuscular Q8H PRN Phineas Inches, MD   20 mg at 02/04/22 1010  ? ?PTA Medications: ?Medications Prior to Admission  ?Medication Sig Dispense Refill Last Dose  ? acetaminophen (TYLENOL) 500 MG tablet Take 1 tablet (500 mg total) by mouth every 6 (six) hours as needed. 30 tablet 0   ? amLODipine (NORVASC) 10 MG tablet Take 1 tablet (10 mg total) by mouth daily. (Patient not taking: Reported on 01/29/2022) 30 tablet 0   ? atorvastatin (LIPITOR) 20 MG tablet Take 1 tablet (20 mg total) by mouth daily. (Patient not taking: Reported on 01/29/2022) 30 tablet  0   ? busPIRone (BUSPAR) 15 MG tablet Take 1 tablet (15 mg total) by mouth 2 (two) times daily.     ? Dextromethorphan HBr (VICKS DAYQUIL COUGH PO) Take by mouth.     ? folic acid (FOLVITE) 1 MG tablet Take 1 tablet (1 mg total) by mouth daily. 30 tablet 0   ? gabapentin (NEURONTIN) 300 MG capsule Take 2 capsules (600 mg total) by mouth 2 (two) times daily.     ? hydrocerin (EUCERIN) CREA Apply 1 application. topically daily. 228 g 0   ? Multiple Vitamin (MULTIVITAMIN WITH MINERALS) TABS tablet Take 1 tablet by mouth daily. 30 tablet 0   ? nicotine (NICODERM CQ - DOSED IN MG/24 HOURS) 21 mg/24hr patch Place 1 patch (21 mg total) onto the skin daily. 28 patch 0   ? QUEtiapine (SEROQUEL) 100 MG tablet Take 1 tablet (100 mg total) by mouth daily.     ? QUEtiapine (SEROQUEL) 400 MG tablet Take 1 tablet (400 mg total) by mouth at bedtime. 30 tablet 0   ? thiamine 100 MG tablet Take 1 tablet (100 mg total) by mouth daily. 30 tablet 0   ? traZODone (DESYREL) 100 MG tablet Take 1 tablet (100 mg total) by mouth at bedtime as needed for sleep. 30 tablet 0   ? ? ?Patient Stressors: Financial difficulties   ?Medication change or noncompliance   ?Occupational concerns   ?Substance abuse   ?Traumatic event   ? ?Patient Strengths: Ability for insight  ?Communication skills  ?Financial means  ?Physical Health  ?Work skills  ? ?Treatment Modalities: Medication Management, Group therapy, Case management,  ?1 to 1 session with clinician, Psychoeducation, Recreational therapy. ? ? ?Physician Treatment Plan for Primary Diagnosis: Substance-induced psychotic disorder with hallucinations (HCC) ?Long Term Goal(s): Improvement in symptoms so as ready for discharge  ? ?Short Term Goals: Ability to identify changes in lifestyle to reduce recurrence of condition will improve ?Ability to verbalize feelings will improve ?Ability to disclose and discuss suicidal ideas ?Ability to demonstrate self-control will improve ?Ability to identify and  develop effective coping behaviors will improve ?Ability to maintain clinical measurements within normal limits will improve ?Compliance with prescribed medications will improve ?Ability to identify triggers  associated with substance abuse/mental health issues will improve ? ?Medication Management: Evaluate patient's response, side effects, and tolerance of medication regimen. ? ?Therapeutic Interventions: 1 to 1 sessions, Unit Group sessions and Medication administration. ? ?Evaluation of Outcomes: Progressing ? ?Physician Treatment Plan for Secondary Diagnosis: Principal Problem: ?  Substance-induced psychotic disorder with hallucinations (HCC) ? ?Long Term Goal(s): Improvement in symptoms so as ready for discharge  ? ?Short Term Goals: Ability to identify changes in lifestyle to reduce recurrence of condition will improve ?Ability to verbalize feelings will improve ?Ability to disclose and discuss suicidal ideas ?Ability to demonstrate self-control will improve ?Ability to identify and develop effective coping behaviors will improve ?Ability to maintain clinical measurements within normal limits will improve ?Compliance with prescribed medications will improve ?Ability to identify triggers associated with substance abuse/mental health issues will improve    ? ?Medication Management: Evaluate patient's response, side effects, and tolerance of medication regimen. ? ?Therapeutic Interventions: 1 to 1 sessions, Unit Group sessions and Medication administration. ? ?Evaluation of Outcomes: Progressing ? ? ?RN Treatment Plan for Primary Diagnosis: Substance-induced psychotic disorder with hallucinations (HCC) ?Long Term Goal(s): Knowledge of disease and therapeutic regimen to maintain health will improve ? ?Short Term Goals: Ability to remain free from injury will improve, Ability to verbalize frustration and anger appropriately will improve, Ability to demonstrate self-control, Ability to participate in decision making  will improve, Ability to verbalize feelings will improve, Ability to disclose and discuss suicidal ideas, Ability to identify and develop effective coping behaviors will improve, and Compliance with pres
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
## 2022-02-04 NOTE — Progress Notes (Signed)
Child/Adolescent Psychoeducational Group Note ? ?Date:  02/04/2022 ?Time:  8:52 PM ? ?Group Topic/Focus:  Wrap-Up Group:   The focus of this group is to help patients review their daily goal of treatment and discuss progress on daily workbooks. ? ?Participation Level:  Active ? ?Participation Quality:  Appropriate ? ?Affect:  Appropriate ? ?Cognitive:  Appropriate ? ?Insight:  Appropriate ? ?Engagement in Group:  Engaged ? ?Modes of Intervention:  Discussion ? ?Additional Comments:   ?Pt stated his goal for today was to stay out of trouble with the law and to work on his alcohol usage. Pt seemed concerned about not being able to go to work. Pt was encouraged to speak with his SW about this issue. ? ?Lance Abbott ?02/04/2022, 8:52 PM ?

## 2022-02-05 NOTE — Progress Notes (Signed)
Adult Psychoeducational Group Note ? ?Date:  02/05/2022 ?Time:  8:53 PM ? ?Group Topic/Focus:  ?Wrap-Up Group:   The focus of this group is to help patients review their daily goal of treatment and discuss progress on daily workbooks. ? ?Participation Level:  Did Not Attend ? ?Participation Quality:   Did Not Attend ? ?Affect:   Did Not Attend ? ?Cognitive:   Did Not Attend ? ?Insight: None ? ?Engagement in Group:   Did Not Attend ? ?Modes of Intervention:   Did Not Attend  ? ?Additional Comments:  Pt was encouraged to attend wrap up group but did not attend ? ?Lance Abbott ?02/05/2022, 8:53 PM ?

## 2022-02-05 NOTE — Progress Notes (Signed)
?   02/05/22 1015  ?Psych Admission Type (Psych Patients Only)  ?Admission Status Involuntary  ?Psychosocial Assessment  ?Patient Complaints Anxiety;Irritability  ?Eye Contact Fair  ?Facial Expression Worried;Anxious  ?Affect Anxious  ?Speech Tangential;Argumentative  ?Interaction Attention-seeking;Needy  ?Motor Activity Slow  ?Appearance/Hygiene Poor hygiene;Disheveled  ?Behavior Characteristics Irritable  ?Mood Anxious;Depressed;Irritable  ?Thought Process  ?Coherency Concrete thinking  ?Content Blaming others  ?Delusions Paranoid  ?Perception Hallucinations  ?Hallucination Auditory;Visual  ?Judgment Impaired  ?Confusion None  ?Danger to Self  ?Current suicidal ideation? Denies  ?Danger to Others  ?Danger to Others None reported or observed  ? ? ?
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
## 2022-02-05 NOTE — BHH Group Notes (Signed)
Adult Psychoeducational Group Note ? ?Date:  02/05/2022 ?Time:  9:47 AM ? ?Group Topic/Focus:  ?Goals Group:   The focus of this group is to help patients establish daily goals to achieve during treatment and discuss how the patient can incorporate goal setting into their daily lives to aide in recovery. ? ?Participation Level:  Active ? ?Participation Quality:  Appropriate ? ?Affect:  Appropriate ? ?Cognitive:  Alert ? ?Insight: Appropriate ? ?Engagement in Group:  Engaged ? ?Modes of Intervention:  Discussion ? ?Additional Comments:  Patient attended and participated in the goals group. ? ?Lance Abbott ?02/05/2022, 9:47 AM ?

## 2022-02-05 NOTE — BHH Group Notes (Signed)
Group Date: 02/05/2022 ?Start Time: 11:30am ?End Time: 12:00pm ?  ?  ?Type of Therapy and Topic:  Group Therapy: Anger ?  ?Participation Level:  Active ?  ?Description of Group:   Due to the acuity on the unit, staff recommended no close contact at this time so group was not held.  Patient was provided with written information and worksheets about anger.  CSW could not go over information with the patient, as he was uncomfortably close to CSW and getting closer. ?  ?  ?  ?Ambrose Mantle, LCSW ?02/05/2022 ?

## 2022-02-05 NOTE — Progress Notes (Signed)
Pt got up requesting Ativan, pt informed he received Ativan with HS medications, pt continues to constantly ask for medications ?

## 2022-02-05 NOTE — Progress Notes (Signed)
?   02/05/22 0500  ?Sleep  ?Number of Hours 6.5  ? ? ?

## 2022-02-05 NOTE — Plan of Care (Signed)
?  Problem: Coping: ?Goal: Ability to verbalize frustrations and anger appropriately will improve ?Outcome: Not Progressing ?  ?Problem: Coping: ?Goal: Ability to demonstrate self-control will improve ?Outcome: Not Progressing ?  ?Problem: Health Behavior/Discharge Planning: ?Goal: Compliance with treatment plan for underlying cause of condition will improve ?Outcome: Progressing ?  ?Problem: Safety: ?Goal: Periods of time without injury will increase ?Outcome: Progressing ?  ?Problem: Education: ?Goal: Knowledge of the prescribed therapeutic regimen will improve ?Outcome: Progressing ?  ?
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
## 2022-02-06 NOTE — Progress Notes (Addendum)
Patient has been up and active on the unit this evening and has voiced no complaints. Patient currently denies having pain, -si/hi/a/v hall. Support and encouragement offered, safety maintained on unit, will continue to monitor. Patient took his scheduled hs medications and did not come back asking for prn medications.  ? ? 02/05/22 2130  ?Psych Admission Type (Psych Patients Only)  ?Admission Status Involuntary  ?Psychosocial Assessment  ?Patient Complaints None  ?Eye Contact Fair  ?Facial Expression Anxious  ?Affect Anxious;Appropriate to circumstance;Flat  ?Speech Logical/coherent  ?Interaction Assertive  ?Motor Activity Other (Comment) ?(wnl)  ?Appearance/Hygiene Disheveled  ?Behavior Characteristics Cooperative  ?Mood Anxious;Pleasant  ?Thought Process  ?Coherency [place] thinking  ?Content Blaming others  ?Delusions Paranoid  ?Perception Hallucinations  ?Hallucination Auditory;Visual  ?Judgment Impaired  ?Confusion None  ?Danger to Self  ?Current suicidal ideation? Denies  ?Danger to Others  ?Danger to Others None reported or observed  ? ? ?

## 2022-02-06 NOTE — Group Note (Signed)
LCSW Group Therapy ? ? ?CSW group not facilitated due to unit limitations on patient proximity/interactions due to infection prevention measures. ? ?Sophee Mckimmy T Nihira Puello LCSWA  ?10:38 AM  ?

## 2022-02-06 NOTE — Plan of Care (Signed)
?  Problem: Health Behavior/Discharge Planning: ?Goal: Identification of resources available to assist in meeting health care needs will improve ?Outcome: Progressing ?  ?Problem: Health Behavior/Discharge Planning: ?Goal: Compliance with treatment plan for underlying cause of condition will improve ?Outcome: Progressing ?  ?Problem: Physical Regulation: ?Goal: Ability to maintain clinical measurements within normal limits will improve ?Outcome: Progressing ?  ?Problem: Safety: ?Goal: Periods of time without injury will increase ?Outcome: Progressing ?  ?

## 2022-02-06 NOTE — Progress Notes (Signed)
Patient ID: Lance Abbott, male   DOB: Dec 10, 1980, 41 y.o.   MRN: [id number] ? ?Pt reports increasing agitation "because the other pts are yelling and arguing." PRN medication offered. Geodon 20 mg given. ?

## 2022-02-06 NOTE — Plan of Care (Signed)
?  Problem: Coping: ?Goal: Ability to demonstrate self-control will improve ?Outcome: Progressing ?  ?Problem: Health Behavior/Discharge Planning: ?Goal: Identification of resources available to assist in meeting health care needs will improve ?Outcome: Progressing ?  ?Problem: Health Behavior/Discharge Planning: ?Goal: Compliance with treatment plan for underlying cause of condition will improve ?02/06/2022 1235 by Tania Ade, RN ?Outcome: Progressing ?02/06/2022 0912 by Tania Ade, RN ?Outcome: Progressing ?  ?Problem: Physical Regulation: ?Goal: Ability to maintain clinical measurements within normal limits will improve ?Outcome: Progressing ?  ?Problem: Safety: ?Goal: Periods of time without injury will increase ?02/06/2022 1235 by Tania Ade, RN ?Outcome: Progressing ?02/06/2022 0912 by Tania Ade, RN ?Outcome: Progressing ?  ?

## 2022-02-06 NOTE — Progress Notes (Signed)
?   02/06/22 1205  ?Psych Admission Type (Psych Patients Only)  ?Admission Status Involuntary  ?Psychosocial Assessment  ?Patient Complaints None  ?Eye Contact Fair  ?Facial Expression Anxious  ?Affect Anxious  ?Speech Logical/coherent  ?Interaction Assertive;Needy  ?Motor Activity Other (Comment) ?(WNL)  ?Appearance/Hygiene Disheveled  ?Behavior Characteristics Irritable  ?Mood Irritable  ?Thought Process  ?Coherency Flight of ideas;Disorganized  ?Content Blaming others  ?Delusions Paranoid  ?Perception Hallucinations  ?Hallucination Auditory  ?Judgment Impaired  ?Confusion None  ?Danger to Self  ?Current suicidal ideation? Denies  ?Danger to Others  ?Danger to Others None reported or observed  ? ? ?

## 2022-02-07 DIAGNOSIS — F19951 Other psychoactive substance use, unspecified with psychoactive substance-induced psychotic disorder with hallucinations: Secondary | ICD-10-CM | POA: Diagnosis not present

## 2022-02-07 MED ORDER — QUETIAPINE FUMARATE 100 MG PO TABS: 100.0000 mg | ORAL_TABLET | Freq: Three times a day (TID) | ORAL | Status: DC | PRN

## 2022-02-07 MED ORDER — NAPROXEN 500 MG PO TABS
500.0000 mg | ORAL_TABLET | Freq: Two times a day (BID) | ORAL | Status: DC | PRN
  Administered 2022-02-07 – 2022-02-08 (×2): 500 mg via ORAL
  Filled 2022-02-07: qty 1

## 2022-02-07 MED ORDER — ARIPIPRAZOLE ER 400 MG IM SRER
400.0000 mg | INTRAMUSCULAR | Status: DC
  Administered 2022-02-07: 400 mg via INTRAMUSCULAR

## 2022-02-07 MED ORDER — METHOCARBAMOL 500 MG PO TABS
500.0000 mg | ORAL_TABLET | Freq: Three times a day (TID) | ORAL | Status: DC | PRN
  Administered 2022-02-07 – 2022-02-08 (×2): 500 mg via ORAL
  Filled 2022-02-07 (×2): qty 1

## 2022-02-07 MED ORDER — CLONIDINE HCL 0.1 MG PO TABS
0.1000 mg | ORAL_TABLET | Freq: Two times a day (BID) | ORAL | Status: DC
  Administered 2022-02-07 – 2022-02-08 (×2): 0.1 mg via ORAL
  Filled 2022-02-07 (×2): qty 1
  Filled 2022-02-07: qty 14
  Filled 2022-02-07 (×3): qty 1
  Filled 2022-02-07: qty 14

## 2022-02-07 NOTE — Group Note (Signed)
Recreation Therapy Group Note ? ? ?Group Topic:Health and Wellness  ?Group Date: 02/07/2022 ?Start Time: 1005 ?End Time: [id number] ?Facilitators: Victorino Sparrow, LRT,CTRS ?Location: [place] ? ? ?Goal Area(s) Addresses:  ?Patient will define components of whole wellness. ?Patient will verbalize benefit of whole wellness. ? ?Group Description:  Exercise/Trivia.  LRT and patients went over the importance of proper wellness.  LRT and patients focused on two of the elements that make up wellness, physical and mental.  Patients took turns leading group is stretches and exercises of their choosing.  The group then took turns asking trivia questions dealing with various topics such as science, technology, movies, music and sports. ? ? ?Affect/Mood: N/A ?  ?Participation Level: Did not attend ?  ? ?Clinical Observations/Individualized Feedback:   ? ? ?Plan: Continue to engage patient in RT group sessions 2-3x/week. ? ? ?Victorino Sparrow, LRT,CTRS ?02/07/2022 1:24 PM ?

## 2022-02-07 NOTE — Group Note (Signed)
LCSW Therapy Group Note ?  ?Group Date: 02/05/2022 ?Start Time: 11:30am ?End Time: 12:00pm ?  ?  ?Type of Therapy and Topic:  Group Therapy: Circle of Control ?  ?Participation Level:  Active ?  ?Description of Group:   Due to the acuity on the unit, staff recommended no close contact at this time so group was not held.  Patient was provided with written information and worksheets about circle of control.  CSW could not go over information with the patient, as she was on the phone. ?  ?  ?Alonda Weaber MSW, LCSW ?Clincal Social Worker  ?[facility]  ?

## 2022-02-07 NOTE — Progress Notes (Signed)
Pt reports he slept fair last night with good appetite, fair eye contact and blunted affect. Presents agitated earlier this shift. Verbal outburst X 1 this shift. Received PRN Vistaril and Seroquel as ordered (see EMAR) with desired effect when reassessed.  Visible in milieu majority of this shift, attended scheduled groups. Endorsed +AVH "I still see angels, devils and hear them talk to me all the time, I'm used to it". Denies SI and HI, verbally contracts for safety. PRN Robaxin and Naproxen  given for muscle spasms and lower back pain 7/10 at approximately 1559. Reported relief when reassessed at 1655. Verbal education provided on all medications, effects monitored. Support and encouragement provided to pt throughout this shift. Safety checks maintained at Q 15 minutes intervals without self harm gestures. Tolerates meals, medications and fluids well without discomfort.  ? ?
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
## 2022-02-07 NOTE — Progress Notes (Signed)
?   02/06/22 2108  ?Psych Admission Type (Psych Patients Only)  ?Admission Status Involuntary  ?Psychosocial Assessment  ?Patient Complaints None  ?Eye Contact Fair  ?Facial Expression Anxious  ?Affect Anxious  ?Speech Logical/coherent  ?Interaction Assertive  ?Motor Activity Other (Comment) ?(WDL)  ?Appearance/Hygiene Improved  ?Behavior Characteristics Cooperative  ?Mood Anxious  ?Thought Process  ?Coherency WDL  ?Content Blaming others  ?Delusions None reported or observed  ?Perception WDL  ?Hallucination None reported or observed  ?Judgment Impaired  ?Confusion None  ?Danger to Self  ?Current suicidal ideation? Denies  ?Danger to Others  ?Danger to Others None reported or observed  ? ?D: Patient reports he had a good day and had his hair braided. Pt reports he is tolerating medication well and looking forward to discharge soon.  ?A: Medications administered as prescribed. Support and encouragement provided as needed.  ?R: Patient remains safe on the unit. Will continue to monitor for safety and stability.   ?

## 2022-02-07 NOTE — Progress Notes (Signed)
Adult Psychoeducational Group Note ? ?Date:  02/07/2022 ?Time:  8:36 PM ? ?Group Topic/Focus:  ?Wrap-Up Group:   The focus of this group is to help patients review their daily goal of treatment and discuss progress on daily workbooks. ? ?Participation Level:  Active ? ?Participation Quality:  Appropriate ? ?Affect:  Anxious and Irritable ? ?Cognitive:  Appropriate ? ?Insight: Appropriate ? ?Engagement in Group:  Engaged ? ?Modes of Intervention:  Discussion ? ?Additional Comments:  Pt stated his goal for today was to focus on his treatment plan and talked with his doctor about his discharge plan. Pt stated he accomplished his goals today. Pt stated he talked with his doctor and social worker about his care today. Pt rated his overall day a 3 out of 10. Pt stated he was upset most of the day because he was schedule for discharge today but it did not happen. Pt stated he was able to contact his brother and his step mother today which improved his day. Pt stated he felt better about himself tonight. Pt stated staff brought back all meals for him today. Pt stated he took all medications provided today. Pt stated he attend all groups held today. Pt stated his appetite was pretty good today. Pt rated sleep last night was poor. Pt stated the goal tonight was to get some rest. Pt stated he had some physical pain tonight. Pt stated he had pain in both his right, left hand, back, and mouth tonight. Pt rated his severe pain a 10 on the pain in level scale. Pt nurse was updated on the situation. Pt deny visual hallucinations and auditory issues tonight. Pt denies thoughts of harming himself or others. Pt stated he would alert staff if anything changed. ? ?Candy Sledge ?02/07/2022, 8:36 PM ?

## 2022-02-08 DIAGNOSIS — F19951 Other psychoactive substance use, unspecified with psychoactive substance-induced psychotic disorder with hallucinations: Secondary | ICD-10-CM | POA: Diagnosis not present

## 2022-02-08 MED ORDER — QUETIAPINE FUMARATE 400 MG PO TABS
400.0000 mg | ORAL_TABLET | Freq: Every day | ORAL | 0 refills | Status: AC
Start: 2022-02-08 — End: 2022-12-17

## 2022-02-08 MED ORDER — ARIPIPRAZOLE 15 MG PO TABS: 15.0000 mg | ORAL_TABLET | Freq: Every day | ORAL | 0 refills | Status: AC

## 2022-02-08 MED ORDER — NICOTINE 21 MG/24HR TD PT24
21.0000 mg | MEDICATED_PATCH | Freq: Every day | TRANSDERMAL | Status: DC
  Filled 2022-02-08 (×2): qty 7

## 2022-02-08 MED ORDER — NICOTINE POLACRILEX 2 MG MT GUM: 2.0000 mg | CHEWING_GUM | OROMUCOSAL | 0 refills | Status: DC | PRN

## 2022-02-08 MED ORDER — CLONIDINE HCL 0.1 MG PO TABS: 0.1000 mg | ORAL_TABLET | Freq: Two times a day (BID) | ORAL | 0 refills | Status: DC

## 2022-02-08 MED ORDER — TRAZODONE HCL 50 MG PO TABS: 50.0000 mg | ORAL_TABLET | Freq: Every evening | ORAL | 0 refills | Status: DC | PRN

## 2022-02-08 MED ORDER — GABAPENTIN 400 MG PO CAPS: 400.0000 mg | ORAL_CAPSULE | Freq: Three times a day (TID) | ORAL | 0 refills | Status: DC

## 2022-02-08 MED ORDER — QUETIAPINE FUMARATE 100 MG PO TABS
100.0000 mg | ORAL_TABLET | Freq: Every day | ORAL | 0 refills | Status: DC
Start: 2022-02-09 — End: 2022-10-11

## 2022-02-08 MED ORDER — ARIPIPRAZOLE ER 400 MG IM SRER: 400.0000 mg | INTRAMUSCULAR | 0 refills | Status: AC

## 2022-02-08 MED ORDER — NICOTINE 21 MG/24HR TD PT24: 21.0000 mg | MEDICATED_PATCH | Freq: Every day | TRANSDERMAL | 0 refills | Status: DC

## 2022-02-08 MED ORDER — BUSPIRONE HCL 15 MG PO TABS: 15.0000 mg | ORAL_TABLET | Freq: Three times a day (TID) | ORAL | 0 refills | Status: AC

## 2022-02-08 MED ORDER — ATORVASTATIN CALCIUM 40 MG PO TABS: 40.0000 mg | ORAL_TABLET | Freq: Every day | ORAL | 0 refills | Status: DC

## 2022-02-08 MED ORDER — AMLODIPINE BESYLATE 10 MG PO TABS
10.0000 mg | ORAL_TABLET | Freq: Every day | ORAL | 0 refills | Status: DC
Start: 2022-02-08 — End: 2022-10-11

## 2022-02-08 MED ORDER — HYDROXYZINE HCL 25 MG PO TABS: 25.0000 mg | ORAL_TABLET | Freq: Three times a day (TID) | ORAL | 0 refills | Status: AC | PRN

## 2022-02-08 NOTE — Group Note (Signed)
Recreation Therapy Group Note ? ? ?Group Topic:Coping Skills  ?Group Date: 02/08/2022 ?Start Time: 1000 ?End Time: [id number] ?Facilitators: Victorino Sparrow, LRT,CTRS ?Location: [facility] ? ? ?Goal Area(s) Addresses:  ?Patient will identify positive coping skill strategies. ?Patient will identify benefits of using coping skills post d/c. ? ?Group Description:  Mind Map.  Patient was provided a blank template of a diagram with 32 blank boxes in a tiered system, branching from the center (similar to a bubble chart). LRT directed patients to label the middle of the diagram "Coping Skills" and consider 8 different challenges in which coping skills would be needed. Patients were directed to record those 8 (anger, anxiety, depression, fear, nervousness, overwhelmed, aggravated and finances)  in the 2nd tier boxes closest to the center. Patients were to then come up with 3 coping skills for each area identified.  LRT would then write patient responses on the board so patients could fill in any blank spaces on their diagram.  ? ? ?Affect/Mood: Appropriate ?  ?Participation Level: Engaged ?  ?Participation Quality: Independent ?  ?Behavior: Appropriate ?  ?Speech/Thought Process: Focused ?  ?Insight: Good ?  ?Judgement: Good ?  ?Modes of Intervention: Worksheet ?  ?Patient Response to Interventions:  Engaged ?  ?Education Outcome: ? Acknowledges education and In group clarification offered   ? ?Clinical Observations/Individualized Feedback: Pt was bright and appropriate during group session.  Pt was also social with peers and attentive when they spoke.  Pt came up with coping skills such as deep breathing, walk away, count to 10, lifting weights, cry, scream, pray, go for a jog and focus on heart rate.  ? ? ?Plan: Continue to engage patient in RT group sessions 2-3x/week. ? ? ?Victorino Sparrow, LRT,CTRS ?02/08/2022 11:59 AM ?
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
## 2022-02-08 NOTE — Discharge Instructions (Signed)
-  Follow-up with your outpatient psychiatric provider -instructions on appointment date, time, and address (location) are provided to you in discharge paperwork.  -Take your psychiatric medications as prescribed at discharge - instructions are provided to you in the discharge paperwork  -Follow-up with outpatient primary care doctor and other specialists -for management of chronic medical disease.  -Recommend abstinence from alcohol, tobacco, and other illicit drug use at discharge.   -If your psychiatric symptoms recur, worsen, or if you have side effects to your psychiatric medications, call your outpatient psychiatric provider, 911, [phone] or go to the nearest emergency department.  -If suicidal thoughts recur, call your outpatient psychiatric provider, 911, [phone] or go to the nearest emergency department.  

## 2022-02-08 NOTE — Progress Notes (Signed)
?  [facility] Adult Case Management Discharge Plan : ? ?Will you be returning to the same living situation after discharge:  Yes,  to home ?At discharge, do you have transportation home?: No. Taxi will be arranged ?Do you have the ability to pay for your medications: No. Samples to be provided at discharge ? ? ?Release of information consent forms completed and in the chart;  Patient's signature needed at discharge. ? ?Patient to Follow up at: ? Follow-up Information   ? ? Services, [facility]. Go on 02/09/2022.   ?Why: You have a hospital follow up appointment for therapy and medication management services on 02/09/22 at 9:00 am.  This appointment will be held in person. ?Contact information: ?[address] ?[place] [address] ?[phone] ? ? ?  ?  ? ?  ?  ? ?  ? ? ?Next level of care provider has access to [place] ? ?Safety Planning and Suicide Prevention discussed: Yes,  with patient ? ?  ? ?Has patient been referred to the Quitline?: Yes, faxed on 02/08/2022 ? ?Patient has been referred for addiction treatment: Yes. Substance use counseling through [facility]. ? ?Vassie Moselle, LCSW ?02/08/2022, 9:43 AM ?

## 2022-02-08 NOTE — [facility] Suicide Risk Assessment (Signed)
[facility] [place] Discharge Suicide Risk Assessment ? ? ?Principal Problem: Substance-induced psychotic disorder with hallucinations (HCC) ?Discharge Diagnoses: Principal Problem: ?  Substance-induced psychotic disorder with hallucinations (HCC) ?Active Problems: ?  Bipolar I disorder, single manic episode, severe, with psychosis (HCC) ?  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  Aggressive behavior ? ? ?Total Time spent with patient: 20 minutes ? ?Lance Abbott is a 41 yo patient w/ PPH of Bipolar disorder, Cluster B personality, and polysubstance abuse who presented to [facility] after transfer from [facility] were he was treated for AKI after presenting via EMS endorsing bizarre behavior in public. ? ?During the patient's hospitalization, patient had extensive initial psychiatric evaluation, and follow-up psychiatric evaluations every day. ? ?Psychiatric diagnoses provided upon initial assessment:  ?Bipolar 1 disorder, current episode mixed ?Substance induced psychosis ?Sedative Hypnotic disorder, BZDs ?Cannabis use disorder, severe ?Opioid abuse ?Nicotine use disorder ?Stimulant use disorder ? ?Patient's psychiatric medications were adjusted on admission:  ?- Continue Seroquel 100mg  daily and 400mg  QHS ?- Start Abilify 15mg  daily, tom ?- Start Buspar 10mg  TID ?- Hold Wellbutrin XL 300mg  ?- Ativan taper for benzo dexotx ?- clonidine taper for opiate detox  ? ?During the hospitalization, other adjustments were made to the patient's psychiatric medication regimen:  ?-buspar was increased to 15 mg tid ?-abilify maintena 400 mg IM LAI was administed on 02-07-2022. Due next in 28 days. Pt to continue abilify 15 mg PO once daily for 14 days after 02-07-2022.  ? ?Gradually, patient started adjusting to milieu.   ?Patient's care was discussed during the interdisciplinary team meeting every day during the hospitalization. ? ?The patient denied having side effects to prescribed psychiatric medication. ? ?The patient reports  their target psychiatric symptoms of psychosis and suicidal thoughts, all responded well to the psychiatric medications, and the patient reports overall benefit other psychiatric hospitalization. Supportive psychotherapy was provided to the patient. The patient also participated in regular group therapy while admitted.  ? ?Labs were reviewed with the patient, and abnormal results were discussed with the patient. ? ?The patient denied having suicidal thoughts more than 48 hours prior to discharge.  Patient denies having homicidal thoughts.  Patient denies having auditory hallucinations.  Patient denies any visual hallucinations.  Patient denies having paranoid thoughts. ? ?The patient is able to verbalize their individual safety plan to this provider. ? ?It is recommended to the patient to continue psychiatric medications as prescribed, after discharge from the hospital.   ? ?It is recommended to the patient to follow up with your outpatient psychiatric provider and PCP. ? ?Discussed with the patient, the impact of alcohol, drugs, tobacco have been there overall psychiatric and medical wellbeing, and total abstinence from substance use was recommended the patient. ? ? ? ? ? ?Musculoskeletal: ?Strength & Muscle Tone: within normal limits ?Gait & Station: normal ?Patient leans: N/A ? ?Psychiatric Specialty Exam ? ?Presentation  ?General Appearance: Appropriate for Environment; Casual; Fairly Groomed ? ?Eye Contact:Good ? ?Speech:Normal Rate ? ?Speech Volume:Normal ? ?Handedness:Right ? ? ?Mood and Affect  ?Mood:Euthymic ? ?Duration of Depression Symptoms: Greater than two weeks ? ?Affect:Appropriate; Congruent; Full Range ? ? ?Thought Process  ?Thought Processes:Linear ? ?Descriptions of Associations:Intact ? ?Orientation:Full (Time, Place and Person) ? ?Thought Content:Logical ? ?History of Schizophrenia/Schizoaffective disorder:Yes ? ?Duration of Psychotic Symptoms:Less than six  months ? ?Hallucinations:Hallucinations: None ? ?Ideas of Reference:None ? ?Suicidal Thoughts:Suicidal Thoughts: No ? ?Homicidal Thoughts:Homicidal Thoughts: No ? ? ?Sensorium  ?Memory:Immediate Good; Recent Good; Remote Good ? ?Judgment:Fair ? ?Insight:Fair ? ? ?  Executive Functions  ?Concentration:Fair ? ?Attention Span:Fair ? ?Recall:Fair ? ?Fund of Knowledge:Fair ? ?Language:Fair ? ? ?Psychomotor Activity  ?Psychomotor Activity:Psychomotor Activity: Normal ? ? ?Assets  ?Assets:Resilience ? ? ?Sleep  ?Sleep:Sleep: Good ? ? ?Physical Exam: ?Physical Exam See discharge summary ? ?ROS See discharge summary ? ?Blood pressure 112/70, pulse (!) 120, temperature 98 ?F (36.7 ?C), temperature source Oral, resp. rate 16, height 5\' 8"  (1.727 m), weight 88.5 kg, SpO2 97 %. Body mass index is 29.65 kg/m?. ? ?Mental Status Per Nursing Assessment::   ?On Admission:  Self-harm thoughts ? ?Demographic factors:  Male, Caucasian, Low socioeconomic status, Living alone, Adolescent or young adult ?Loss Factors:  Financial problems / change in socioeconomic status, Loss of significant relationship, Decrease in vocational status ?Historical Factors:  Impulsivity ?Risk Reduction Factors:  Employed ? ?Continued Clinical Symptoms:  ?Bipolar disorder - mood is stable. Denies SI. Denies HI. ?Psychosis - resolved.  ? ?Cognitive Features That Contribute To Risk:  ?None   ? ?Suicide Risk:  ?Mild:  There are no identifiable suicide plans, no associated intent, mild dysphoria and related symptoms, good self-control (both objective and subjective assessment), few other risk factors, and identifiable protective factors, including available and accessible social support. ? ? Follow-up Information   ? ? Services, [facility]. Go on 02/09/2022.   ?Why: You have a hospital follow up appointment for therapy and medication management services on 02/09/22 at 9:00 am.  This appointment will be held in person. ?Contact information: ?[address] ?[address] [place] ?[phone] ? ? ?  ?  ? ?  ?  ? ?  ? ? ?Plan Of Care/Follow-up recommendations:  ? ?Activity: as tolerated ? ?Diet: heart healthy ? ?Other: ?-Follow-up with your outpatient psychiatric provider -instructions on appointment date, time, and address (location) are provided to you in discharge paperwork. ? ?-Take your psychiatric medications as prescribed at discharge - instructions are provided to you in the discharge paperwork ? ?-Follow-up with outpatient primary care doctor and other specialists -for management of chronic medical disease. ? ?-Testing: Follow-up with outpatient provider for abnormal lab results:  ?Abnormal lipid panel ? ?-Recommend abstinence from alcohol, tobacco, and other illicit drug use at discharge.  ? ?-If your psychiatric symptoms recur, worsen, or if you have side effects to your psychiatric medications, call your outpatient psychiatric provider, 911, [phone] or go to the nearest emergency department. ? ?-If suicidal thoughts recur, call your outpatient psychiatric provider, 911, [phone] or go to the nearest emergency department. ? ? ?[phone], MD ?02/08/2022, 9:46 AM ? ?

## 2022-02-08 NOTE — Progress Notes (Signed)
Recreation Therapy Notes ? ?INPATIENT RECREATION TR PLAN ? ?Patient Details ?Name: Lance Abbott ?MRN: [id number] ?DOB: January 10, 1981 ?Today's Date: 02/08/2022 ? ?Rec Therapy Plan ?Is patient appropriate for Therapeutic Recreation?: Yes ?Treatment times per week: about 3 days ?Estimated Length of Stay: 5-7 days ?TR Treatment/Interventions: Group participation (Comment) ? ?Discharge Criteria ?Pt will be discharged from therapy if:: Discharged ?Treatment plan/goals/alternatives discussed and agreed upon by:: Patient/family ? ?Discharge Summary ?Short term goals set: See patient care plan ?Short term goals met: Adequate for discharge ?Progress toward goals comments: Groups attended ?Which groups?: Wellness, Coping skills, Leisure education ?Reason goals not met: Limited programming on unit ?Therapeutic equipment acquired: N/A ?Reason patient discharged from therapy: Discharge from hospital ?Pt/family agrees with progress & goals achieved: Yes ?Date patient discharged from therapy: 02/08/22 ? ? ?Victorino Sparrow, LRT,CTRS ?Victorino Sparrow A ?02/08/2022, 12:35 PM ?

## 2022-02-08 NOTE — Progress Notes (Signed)
D: Pt A & O X 3. Denies SI, HI, AVH and pain at this time. D/C home as ordered. Picked up in front of facility by taxi. ?A: D/C instructions reviewed with pt including prescriptions, medication samples and follow up appointment, compliance encouraged. All belongings from assigned locker given to pt at time of departure. Scheduled and PRN medications given with verbal education and effects monitored. Safety checks maintained without incident till time of d/c.  ?R: Pt receptive to care. Compliant with medications when offered. Denies adverse drug reactions when assessed. Verbalized understanding related to d/c instructions. Signed belonging sheet in agreement with items received from locker. Ambulatory with a steady gait. Appears to be in no physical distress at time of departure.   ?

## 2022-02-08 NOTE — Plan of Care (Signed)
Due to limited programming on unit, patient wasn't afforded the opportunity to attend 5 recreation therapy sessions.  The groups patient was able to attend, patient was able to focus with little to no prompting from LRT. ? ? ?Victorino Sparrow, LRT,CTRS ?

## 2022-02-08 NOTE — Progress Notes (Deleted)
Recreation Therapy Notes ? ?INPATIENT RECREATION THERAPY ASSESSMENT ? ?Patient Details ?Name: Lance Abbott ?MRN: [id number] ?DOB: 03/18/1981 ?Today's Date: 02/08/2022 ?      ?Information Obtained From: ?Patient ? ?Able to Participate in Assessment/Interview: ?Yes ? ?Patient Presentation: ?Responsive (Tired, Drowsy) ? ?Reason for Admission (Per Patient): ?Other (Comments) (Pt stated he was having a heart attack.) ? ?Patient Stressors: ?Other (Comment) ("being chased by people trying to kill me") ? ?Coping Skills:   ?Isolation, TV, Sports, Music, Exercise, Deep Breathing, Talk, Art, Prayer, Avoidance, Read, Hot Bath/Shower ? ?Leisure Interests (2+):  ?Games - [employer], Individual - Other (Comment) (Punching Bag; Walk Dog; Clean; Eat) ? ?Frequency of Recreation/Participation: ?Other (Comment) (Daily) ? ?Awareness of Community Resources:  ?No ? ?Expressed Interest in [employer] Information: ?No ? ?[place] of Residence:  ?[place] ? ?Patient Main Form of Transportation: ?Walk ? ?Patient Strengths:  ?Physical; Emotional; "Power of God that moves inside me" ? ?Patient Identified Areas of Improvement:  ?Spelling; Retaining Knowledge; Memorizing ? ?Patient Goal for Hospitalization:  ?"get well" ? ?Current SI (including self-harm):  ?Yes ? ?Current HI:  ?Yes ? ?Current AVH: ?Yes ? ?Staff Intervention Plan: ?Group Attendance, Collaborate with Interdisciplinary Treatment Team ? ?Consent to Intern Participation: ?N/A ? ? ?Caroll Rancher, LRT,CTRS ?Caroll Rancher A ?02/08/2022, 12:34 PM ?

## 2022-03-16 ENCOUNTER — Other Ambulatory Visit: Payer: Self-pay

## 2022-03-16 ENCOUNTER — Encounter (HOSPITAL_COMMUNITY): Payer: Self-pay

## 2022-03-16 ENCOUNTER — Emergency Department (HOSPITAL_COMMUNITY)
Admission: EM | Admit: 2022-03-16 | Discharge: 2022-03-16 | Disposition: A | Discharge status: Home / Self Care | Payer: Self-pay | Attending: Emergency Medicine | Admitting: Emergency Medicine

## 2022-03-16 ENCOUNTER — Emergency Department (HOSPITAL_COMMUNITY): Payer: Self-pay

## 2022-03-16 DIAGNOSIS — M25511 Pain in right shoulder: Secondary | ICD-10-CM | POA: Insufficient documentation

## 2022-03-16 MED ORDER — KETOROLAC TROMETHAMINE 30 MG/ML IJ SOLN
30.0000 mg | Freq: Once | INTRAMUSCULAR | Status: AC
  Administered 2022-03-16: 30 mg via INTRAMUSCULAR
  Filled 2022-03-16: qty 1

## 2022-03-16 MED ORDER — TIZANIDINE HCL 4 MG PO TABS: 4.0000 mg | ORAL_TABLET | Freq: Four times a day (QID) | ORAL | 0 refills | Status: DC | PRN
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
## 2022-03-16 NOTE — Discharge Instructions (Addendum)
Continue using the Lidoderm and taking 800 mg ibuprofen 3 times daily with food and water.  You can take the Zanaflex for muscle spasms. Stretch shoulder gently, ice at home. Follow up with orthopedics if no improvement in 2 weeks.  ?
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
## 2022-06-22 ENCOUNTER — Encounter (HOSPITAL_COMMUNITY): Payer: Self-pay

## 2022-06-22 ENCOUNTER — Emergency Department (HOSPITAL_COMMUNITY)
Admission: EM | Admit: 2022-06-22 | Discharge: 2022-06-22 | Disposition: A | Discharge status: Home / Self Care | Payer: Self-pay | Attending: Student | Admitting: Student

## 2022-06-22 ENCOUNTER — Other Ambulatory Visit: Payer: Self-pay

## 2022-06-22 DIAGNOSIS — K0889 Other specified disorders of teeth and supporting structures: Secondary | ICD-10-CM | POA: Insufficient documentation

## 2022-06-22 MED ORDER — CEFTRIAXONE SODIUM 1 G IJ SOLR
1.0000 g | Freq: Once | INTRAMUSCULAR | Status: AC
Start: 2022-06-22 — End: 2022-06-22
  Administered 2022-06-22: 1 g via INTRAMUSCULAR
  Filled 2022-06-22: qty 10

## 2022-06-22 MED ORDER — DICLOFENAC SODIUM 75 MG PO TBEC: 75.0000 mg | DELAYED_RELEASE_TABLET | Freq: Two times a day (BID) | ORAL | 0 refills | Status: DC

## 2022-06-22 MED ORDER — LIDOCAINE HCL (PF) 1 % IJ SOLN
INTRAMUSCULAR | Status: AC
  Administered 2022-06-22: 30 mL
  Filled 2022-06-22: qty 30

## 2022-06-22 MED ORDER — IBUPROFEN 800 MG PO TABS
800.0000 mg | ORAL_TABLET | Freq: Once | ORAL | Status: AC
Start: 2022-06-22 — End: 2022-06-22
  Administered 2022-06-22: 800 mg via ORAL
  Filled 2022-06-22: qty 1

## 2022-06-22 MED ORDER — AMOXICILLIN 500 MG PO CAPS: 500.0000 mg | ORAL_CAPSULE | Freq: Three times a day (TID) | ORAL | 0 refills | Status: DC

## 2022-06-22 NOTE — ED Provider Notes (Addendum)
[facility] EMERGENCY DEPARTMENT Provider Note   CSN: [id number] Arrival date & time: 06/22/22  3976     History  Chief Complaint  Patient presents with   Dental Pain    Pt has swollen abcess in left jaw     Lance Abbott is a 41 y.o. male.  Pt request a shot of morphine and Iv antibiotics for a toothache.  Pt reports he can not get rx filled today   The history is provided by the patient. No language interpreter was used.  Dental Pain Location:  Upper and lower Lower teeth location:  19/LL 1st molar Quality:  Aching Severity:  Moderate Timing:  Constant Progression:  Worsening Chronicity:  New Context: not abscess        Home Medications Prior to Admission medications   Medication Sig Start Date End Date Taking? Authorizing Provider  amLODipine (NORVASC) 10 MG tablet Take 1 tablet (10 mg total) by mouth daily. 02/08/22   Massengill, Harrold Donath, MD  ARIPiprazole (ABILIFY) 15 MG tablet Take 1 tablet (15 mg total) by mouth daily for 14 days. 02/09/22 02/23/22  Massengill, Harrold Donath, MD  ARIPiprazole ER (ABILIFY MAINTENA) 400 MG SRER injection Inject 2 mLs (400 mg total) into the muscle every 28 (twenty-eight) days. 03/07/22   Massengill, Harrold Donath, MD  atorvastatin (LIPITOR) 40 MG tablet Take 1 tablet (40 mg total) by mouth daily. 02/09/22 03/11/22  Massengill, Harrold Donath, MD  cloNIDine (CATAPRES) 0.1 MG tablet Take 1 tablet (0.1 mg total) by mouth 2 (two) times daily. 02/08/22 03/10/22  Massengill, Harrold Donath, MD  gabapentin (NEURONTIN) 400 MG capsule Take 1 capsule (400 mg total) by mouth every 8 (eight) hours. 02/08/22 03/10/22  Massengill, Harrold Donath, MD  nicotine (NICODERM CQ - DOSED IN MG/24 HOURS) 21 mg/24hr patch Place 1 patch (21 mg total) onto the skin daily. 02/08/22   Massengill, Harrold Donath, MD  nicotine (NICODERM CQ - DOSED IN MG/24 HOURS) 21 mg/24hr patch Place 1 patch (21 mg total) onto the skin daily. 02/09/22   Massengill, Harrold Donath, MD  nicotine polacrilex (NICORETTE) 2 MG gum Take 1 each (2 mg total) by  mouth as needed for smoking cessation. 02/08/22   Massengill, Harrold Donath, MD  QUEtiapine (SEROQUEL) 100 MG tablet Take 1 tablet (100 mg total) by mouth daily. 02/09/22 03/11/22  Massengill, Harrold Donath, MD  QUEtiapine (SEROQUEL) 400 MG tablet Take 1 tablet (400 mg total) by mouth at bedtime. 02/08/22 03/10/22  Massengill, Harrold Donath, MD  tiZANidine (ZANAFLEX) 4 MG tablet Take 1 tablet (4 mg total) by mouth every 6 (six) hours as needed for muscle spasms. 03/16/22   Theron Arista, PA-C  traZODone (DESYREL) 50 MG tablet Take 1 tablet (50 mg total) by mouth at bedtime as needed for sleep. 02/08/22 03/10/22  Massengill, Harrold Donath, MD      Allergies    Cogentin [benztropine], Zyprexa [olanzapine], Celexa [citalopram hydrobromide], Depakote [valproic acid], Effexor [venlafaxine], Geodon [ziprasidone hcl], Haldol [haloperidol], Hydrochlorothiazide, Lasix [furosemide], Lexapro [escitalopram], Lisinopril, Lithium, Thorazine [chlorpromazine], Topamax [topiramate], and Tramadol    Review of Systems   Review of Systems  All other systems reviewed and are negative.   Physical Exam Updated Vital Signs BP (!) 149/109   Pulse (!) 107   Temp 98.6 F (37 C) (Oral)   Resp 16   Ht 5\' 8"  (1.727 m)   Wt 130.6 kg   SpO2 94%   BMI 43.79 kg/m  Physical Exam Vitals and nursing note reviewed.  Constitutional:      Appearance: He is well-developed.  HENT:  Head: Normocephalic.     Nose: Nose normal.     Mouth/Throat:     Mouth: Mucous membranes are moist.     Comments: Multiple areas of decay, erythema around left lower 1st molar  Eyes:     Pupils: Pupils are equal, round, and reactive to light.  Cardiovascular:     Rate and Rhythm: Normal rate.  Pulmonary:     Effort: Pulmonary effort is normal.  Abdominal:     General: There is no distension.  Musculoskeletal:        General: Normal range of motion.     Cervical back: Normal range of motion.  Neurological:     Mental Status: He is alert and oriented to person, place, and  time.     ED Results / Procedures / Treatments   Labs (all labs ordered are listed, but only abnormal results are displayed) Labs Reviewed - No data to display  EKG None  Radiology No results found.  Procedures Procedures    Medications Ordered in ED Medications - No data to display  ED Course/ Medical Decision Making/ A&P                           Medical Decision Making Pt complains of a toothache  Risk Prescription drug management. Risk Details: Pt counseled on the need to see a dentist.  I advised him I will not give him a shot of morphine.  Pt does not sound like he will be compliant with oral antibiotics.  I will give him a shot of rocephin.             Final Clinical Impression(s) / ED Diagnoses Final diagnoses:  Pain, dental    Rx / DC Orders ED Discharge Orders          Ordered    amoxicillin (AMOXIL) 500 MG capsule  3 times daily        06/22/22 0934    diclofenac (VOLTAREN) 75 MG EC tablet  2 times daily        06/22/22 0934          An After Visit Summary was printed and given to the patient.     Elson Areas, PA-C 06/22/22 2703    Elson Areas, PA-C 06/22/22 5009    Glendora Score, MD 06/22/22 [phone]

## 2022-06-22 NOTE — ED Triage Notes (Signed)
Pt arrived to ED c/o dental pain for last three days. Swollen abcess in left cheek.

## 2022-07-11 ENCOUNTER — Encounter (HOSPITAL_COMMUNITY): Payer: Self-pay

## 2022-07-11 ENCOUNTER — Other Ambulatory Visit: Payer: Self-pay

## 2022-07-11 ENCOUNTER — Emergency Department (HOSPITAL_COMMUNITY)
Admission: EM | Admit: 2022-07-11 | Discharge: 2022-07-11 | Disposition: A | Discharge status: Home / Self Care | Payer: Self-pay | Attending: Emergency Medicine | Admitting: Emergency Medicine

## 2022-07-11 DIAGNOSIS — J45909 Unspecified asthma, uncomplicated: Secondary | ICD-10-CM | POA: Insufficient documentation

## 2022-07-11 DIAGNOSIS — K029 Dental caries, unspecified: Secondary | ICD-10-CM | POA: Insufficient documentation

## 2022-07-11 MED ORDER — CLINDAMYCIN HCL 150 MG PO CAPS
450.0000 mg | ORAL_CAPSULE | Freq: Once | ORAL | Status: AC
  Administered 2022-07-11: 450 mg via ORAL
  Filled 2022-07-11: qty 3

## 2022-07-11 MED ORDER — NAPROXEN 500 MG PO TABS: 500.0000 mg | ORAL_TABLET | Freq: Two times a day (BID) | ORAL | 0 refills | Status: DC

## 2022-07-11 MED ORDER — NAPROXEN 500 MG PO TABS: 500.0000 mg | ORAL_TABLET | Freq: Two times a day (BID) | ORAL | 0 refills | Status: AC

## 2022-07-11 MED ORDER — CLINDAMYCIN HCL 150 MG PO CAPS: 450.0000 mg | ORAL_CAPSULE | Freq: Three times a day (TID) | ORAL | 0 refills | Status: AC

## 2022-07-11 NOTE — ED Provider Notes (Signed)
[facility] EMERGENCY DEPARTMENT Provider Note   CSN: [id number] Arrival date & time: 07/11/22  1057     History  Chief Complaint  Patient presents with   Dental Pain    Lance Abbott is a 41 y.o. male with history of panic attacks, anxiety, polysubstance abuse, depression, asthma, bipolar 1 disorder, schizophrenia.  Presenting today due to dental pain.  Seen recently in the ED for same.  States the same tooth on the left lower jaw is still painful, has finished amoxicillin prescribed, and is requesting more antibiotics and stronger pain medication.  States the pain radiates up the jaw to his ear.  Denies fever, tongue swelling, neck swelling, neck stiffness, palpitations, shortness of breath.  The history is provided by the patient and medical records.  Dental Pain    Home Medications Prior to Admission medications   Medication Sig Start Date End Date Taking? Authorizing Provider  clindamycin (CLEOCIN) 150 MG capsule Take 3 capsules (450 mg total) by mouth 3 (three) times daily for 7 days. X 7 days 07/11/22 07/18/22 Yes Cecil Cobbs, PA-C  amLODipine (NORVASC) 10 MG tablet Take 1 tablet (10 mg total) by mouth daily. 02/08/22   Massengill, Harrold Donath, MD  amoxicillin (AMOXIL) 500 MG capsule Take 1 capsule (500 mg total) by mouth 3 (three) times daily. 06/22/22   Elson Areas, PA-C  ARIPiprazole (ABILIFY) 15 MG tablet Take 1 tablet (15 mg total) by mouth daily for 14 days. 02/09/22 02/23/22  Massengill, Harrold Donath, MD  ARIPiprazole ER (ABILIFY MAINTENA) 400 MG SRER injection Inject 2 mLs (400 mg total) into the muscle every 28 (twenty-eight) days. 03/07/22   Massengill, Harrold Donath, MD  atorvastatin (LIPITOR) 40 MG tablet Take 1 tablet (40 mg total) by mouth daily. 02/09/22 03/11/22  Massengill, Harrold Donath, MD  cloNIDine (CATAPRES) 0.1 MG tablet Take 1 tablet (0.1 mg total) by mouth 2 (two) times daily. 02/08/22 03/10/22  Massengill, Harrold Donath, MD  gabapentin (NEURONTIN) 400 MG capsule Take 1 capsule (400 mg total) by  mouth every 8 (eight) hours. 02/08/22 03/10/22  Massengill, Harrold Donath, MD  naproxen (NAPROSYN) 500 MG tablet Take 1 tablet (500 mg total) by mouth 2 (two) times daily. 07/11/22   Cecil Cobbs, PA-C  nicotine (NICODERM CQ - DOSED IN MG/24 HOURS) 21 mg/24hr patch Place 1 patch (21 mg total) onto the skin daily. 02/08/22   Massengill, Harrold Donath, MD  nicotine (NICODERM CQ - DOSED IN MG/24 HOURS) 21 mg/24hr patch Place 1 patch (21 mg total) onto the skin daily. 02/09/22   Massengill, Harrold Donath, MD  nicotine polacrilex (NICORETTE) 2 MG gum Take 1 each (2 mg total) by mouth as needed for smoking cessation. 02/08/22   Massengill, Harrold Donath, MD  QUEtiapine (SEROQUEL) 100 MG tablet Take 1 tablet (100 mg total) by mouth daily. 02/09/22 03/11/22  Massengill, Harrold Donath, MD  QUEtiapine (SEROQUEL) 400 MG tablet Take 1 tablet (400 mg total) by mouth at bedtime. 02/08/22 03/10/22  Massengill, Harrold Donath, MD  tiZANidine (ZANAFLEX) 4 MG tablet Take 1 tablet (4 mg total) by mouth every 6 (six) hours as needed for muscle spasms. 03/16/22   Theron Arista, PA-C  traZODone (DESYREL) 50 MG tablet Take 1 tablet (50 mg total) by mouth at bedtime as needed for sleep. 02/08/22 03/10/22  Massengill, Harrold Donath, MD      Allergies    Cogentin [benztropine], Zyprexa [olanzapine], Celexa [citalopram hydrobromide], Depakote [valproic acid], Effexor [venlafaxine], Geodon [ziprasidone hcl], Haldol [haloperidol], Hydrochlorothiazide, Lasix [furosemide], Lexapro [escitalopram], Lisinopril, Lithium, Thorazine [chlorpromazine], Topamax [topiramate], and Tramadol  Review of Systems   Review of Systems  HENT:  Positive for dental problem.     Physical Exam Updated Vital Signs BP (!) 137/90   Pulse 85   Temp 98.1 F (36.7 C) (Oral)   Resp 18   Ht 5\' 8"  (1.727 m)   Wt 99.8 kg   SpO2 97%   BMI 33.45 kg/m  Physical Exam Vitals and nursing note reviewed.  Constitutional:      General: He is not in acute distress.    Appearance: He is well-developed.  HENT:     Head:  Normocephalic and atraumatic.     Right Ear: Tympanic membrane, ear canal and external ear normal.     Left Ear: Tympanic membrane, ear canal and external ear normal. No drainage or swelling.  No middle ear effusion. There is no impacted cerumen. No foreign body. No mastoid tenderness. Tympanic membrane is not injected, perforated, erythematous, retracted or bulging.     Nose: Nose normal.     Mouth/Throat:     Mouth: Mucous membranes are moist.     Dentition: Dental tenderness and dental caries present. No gingival swelling, dental abscesses or gum lesions.     Tongue: Tongue does not deviate from midline.     Pharynx: Oropharynx is clear. No oropharyngeal exudate or posterior oropharyngeal erythema.     Comments: Without evidence of tongue swelling or elevation.  Poor dentition overall.  Diffuse dental caries.  Some tenderness of bottom left molar with surrounding mild tenderness.  No abscess or evidence of spreading infection.  Mild gingival swelling.   Eyes:     Conjunctiva/sclera: Conjunctivae normal.  Neck:     Comments: No evidence of neck swelling, cervical lymphadenopathy, or jaw swelling.  Minimally tender on the left jaw on exam without warmth or erythema.  Neck very supple on exam.  No meningismus or torticollis. Cardiovascular:     Rate and Rhythm: Normal rate and regular rhythm.     Heart sounds: No murmur heard. Pulmonary:     Effort: Pulmonary effort is normal. No respiratory distress.     Breath sounds: Normal breath sounds.  Abdominal:     Palpations: Abdomen is soft.     Tenderness: There is no abdominal tenderness.  Musculoskeletal:        General: No swelling.     Cervical back: Neck supple.  Skin:    General: Skin is warm and dry.     Capillary Refill: Capillary refill takes less than 2 seconds.  Neurological:     Mental Status: He is alert.  Psychiatric:        Mood and Affect: Mood normal.     ED Results / Procedures / Treatments   Labs (all labs ordered  are listed, but only abnormal results are displayed) Labs Reviewed - No data to display  EKG None  Radiology No results found.  Procedures Procedures    Medications Ordered in ED Medications  clindamycin (CLEOCIN) capsule 450 mg (450 mg Oral Given 07/11/22 1230)    ED Course/ Medical Decision Making/ A&P                           Medical Decision Making Risk Prescription drug management.   41 y.o. male presents to the ED for concern of Dental Pain   This involves an extensive number of treatment options, and is a complaint that carries with it a high risk of complications and morbidity.  The emergent differential diagnosis prior to evaluation includes, but is not limited to: Dental caries, dental abscess, dental fracture  This is not an exhaustive differential.   Past Medical History / Co-morbidities / Social History: Hx of panic attacks, anxiety, polysubstance abuse, depression, asthma, bipolar 1 disorder, schizophrenia Social Determinants of Health include: Polysubstance abuse and mental health disorders, for which cessation counseling was provided.  Also without dentist, resources provided.  Additional History:  Obtained by chart review.  Notably recent ED visit 06/22/2022 for same complaint, given shot of rocephin, amoxicillin.  Pt had requested morphine for pain.  Lab Tests: None  Imaging Studies: None  ED Course: Pt well-appearing on exam.  Patient presenting with toothache.  Nonseptic, non-ill appearing, in NAD.  Afebrile.  Poor dentition overall.  No visible purulent discharge or gross abscess.  Tongue not swollen or elevated.  Neck without swelling or significant tenderness, is very supple on exam.  TMJ ROM appears grossly intact.  Mild tenderness of affected area.  Exam unconcerning for Ludwig's angina or spread of infection.  No murmur on exam.  Airway intact.  Pain managed in ED.  Recently finished course of amoxicillin and ceftriaxone, plan to treat with  clindamycin and anti-inflammatories.  Recommend course of clindamycin for added coverage and urged importance of following up with dentist soon as possible.  Patient requested tizanidine and "stronger meds", which I do not feel is appropriate at this time based on history, physical exam, and presentation.  Pt satisfied with today's encounter.  Patient in NAD and in good condition at time of discharge.    Disposition: After consideration the patient's encounter today, I do not feel today's workup suggests an emergent condition requiring admission or immediate intervention beyond what has been performed at this time.  Safe for discharge; instructed to return immediately for worsening symptoms, change in symptoms or any other concerns.  I have reviewed the patients home medicines and have made adjustments as needed.  Discussed course of treatment with the patient, whom demonstrated understanding.  Patient in agreement and has no further questions.     This chart was dictated using voice recognition software.  Despite best efforts to proofread, errors can occur which can change the documentation meaning.         Final Clinical Impression(s) / ED Diagnoses Final diagnoses:  Pain due to dental caries    Rx / DC Orders ED Discharge Orders          Ordered    clindamycin (CLEOCIN) 150 MG capsule  3 times daily        07/11/22 1224    naproxen (NAPROSYN) 500 MG tablet  2 times daily        07/11/22 1226              Kathie Dike [place], [place] 07/13/22 1431    Vanetta Mulders, MD 07/24/22 0003

## 2022-07-11 NOTE — ED Triage Notes (Signed)
Pt to er, pt states that he was seen previously for a dental infection and he is now done with that course of abx and would like some more.

## 2022-07-11 NOTE — Discharge Instructions (Addendum)
An antibiotic by the name of clindamycin has been sent to the pharmacy.  Please take this capsule every 8 hours (three times per day) for the next 7 days.  Always take with plenty food and water.  Make sure you finish the course of antibiotics as prescribed.  You may take a probiotic capsule daily or a probiotic yogurt daily with this antibiotic to reduce the risk of clindamycin associated C. difficile infection.  You have been prescribed an anti-inflammatory by the name of naproxen.  You may take 1 tablet every 12 hours as needed for pain relief.  Always take with plenty of food and water.  Do not take ibuprofen or Motrin on top of this, stay on same medication family.  Though you may take Tylenol in addition to the naproxen.  You have also been provided a list of dental resources.  It is important that you follow-up within the next 2 to 3 days for reevaluation continue medical management.  Return to the ED for any new or worsening symptoms.

## 2022-08-27 ENCOUNTER — Encounter (HOSPITAL_COMMUNITY): Payer: Self-pay

## 2022-08-27 ENCOUNTER — Other Ambulatory Visit: Payer: Self-pay

## 2022-08-27 ENCOUNTER — Emergency Department (HOSPITAL_COMMUNITY)
Admission: EM | Admit: 2022-08-27 | Discharge: 2022-08-29 | Disposition: A | Discharge status: Other Acute Inpatient Hospital | Payer: Self-pay | Attending: Emergency Medicine | Admitting: Emergency Medicine

## 2022-08-27 DIAGNOSIS — G8929 Other chronic pain: Secondary | ICD-10-CM | POA: Insufficient documentation

## 2022-08-27 DIAGNOSIS — K029 Dental caries, unspecified: Secondary | ICD-10-CM | POA: Insufficient documentation

## 2022-08-27 DIAGNOSIS — R45851 Suicidal ideations: Secondary | ICD-10-CM | POA: Insufficient documentation

## 2022-08-27 DIAGNOSIS — J45909 Unspecified asthma, uncomplicated: Secondary | ICD-10-CM | POA: Insufficient documentation

## 2022-08-27 DIAGNOSIS — I10 Essential (primary) hypertension: Secondary | ICD-10-CM | POA: Insufficient documentation

## 2022-08-27 DIAGNOSIS — F10288 Alcohol dependence with other alcohol-induced disorder: Secondary | ICD-10-CM | POA: Insufficient documentation

## 2022-08-27 DIAGNOSIS — Z87891 Personal history of nicotine dependence: Secondary | ICD-10-CM | POA: Insufficient documentation

## 2022-08-27 DIAGNOSIS — Z79899 Other long term (current) drug therapy: Secondary | ICD-10-CM | POA: Insufficient documentation

## 2022-08-27 DIAGNOSIS — F250 Schizoaffective disorder, bipolar type: Secondary | ICD-10-CM | POA: Insufficient documentation

## 2022-08-27 DIAGNOSIS — R748 Abnormal levels of other serum enzymes: Secondary | ICD-10-CM | POA: Insufficient documentation

## 2022-08-27 DIAGNOSIS — F1220 Cannabis dependence, uncomplicated: Secondary | ICD-10-CM | POA: Insufficient documentation

## 2022-08-27 DIAGNOSIS — F1020 Alcohol dependence, uncomplicated: Secondary | ICD-10-CM | POA: Diagnosis present

## 2022-08-27 DIAGNOSIS — Y904 Blood alcohol level of 80-99 mg/100 ml: Secondary | ICD-10-CM | POA: Insufficient documentation

## 2022-08-27 LAB — COMPREHENSIVE METABOLIC PANEL
ALT: 23 U/L (ref 0–44)
AST: 20 U/L (ref 15–41)
Albumin: 4.8 g/dL (ref 3.5–5.0)
Alkaline Phosphatase: 81 U/L (ref 38–126)
Anion gap: 8 (ref 5–15)
BUN: 10 mg/dL (ref 6–20)
CO2: 24 mmol/L (ref 22–32)
Calcium: 9.6 mg/dL (ref 8.9–10.3)
Chloride: 109 mmol/L (ref 98–111)
Creatinine, Ser: 0.84 mg/dL (ref 0.61–1.24)
GFR, Estimated: >60 mL/min (ref >60)
Glucose, Bld: 85 mg/dL (ref 70–99)
Potassium: 3.4 mmol/L — ABNORMAL LOW (ref 3.5–5.1)
Sodium: 141 mmol/L (ref 135–145)
Total Bilirubin: 0.6 mg/dL (ref 0.3–1.2)
Total Protein: 7.5 g/dL (ref 6.5–8.1)

## 2022-08-27 LAB — RAPID URINE DRUG SCREEN, HOSP PERFORMED
Amphetamines: NOT DETECTED
Barbiturates: NOT DETECTED
Benzodiazepines: NOT DETECTED
Cocaine: NOT DETECTED
Opiates: NOT DETECTED
Tetrahydrocannabinol: POSITIVE — AB

## 2022-08-27 LAB — SALICYLATE LEVEL: Salicylate Lvl: <7 mg/dL — ABNORMAL LOW (ref 7.0–30.0)

## 2022-08-27 LAB — CBC
HCT: 43.6 % (ref 39.0–52.0)
Hemoglobin: 15.1 g/dL (ref 13.0–17.0)
MCH: 31.2 pg (ref 26.0–34.0)
MCHC: 34.6 g/dL (ref 30.0–36.0)
MCV: 90.1 fL (ref 80.0–100.0)
Platelets: 308 10*3/uL (ref 150–400)
RBC: 4.84 MIL/uL (ref 4.22–5.81)
RDW: 13.9 % (ref 11.5–15.5)
WBC: 10.6 10*3/uL — ABNORMAL HIGH (ref 4.0–10.5)
nRBC: 0 % (ref 0.0–0.2)

## 2022-08-27 LAB — ACETAMINOPHEN LEVEL: Acetaminophen (Tylenol), Serum: <10 ug/mL — ABNORMAL LOW (ref 10–30)

## 2022-08-27 LAB — ETHANOL: Alcohol, Ethyl (B): 88 mg/dL — ABNORMAL HIGH (ref <10)

## 2022-08-27 MED ORDER — FOLIC ACID 1 MG PO TABS
1.0000 mg | ORAL_TABLET | Freq: Every day | ORAL | Status: DC
  Administered 2022-08-28: 1 mg via ORAL
  Filled 2022-08-27: qty 1

## 2022-08-27 MED ORDER — STERILE WATER FOR INJECTION IJ SOLN
INTRAMUSCULAR | Status: AC
  Administered 2022-08-27: 10 mL
  Filled 2022-08-27: qty 10

## 2022-08-27 MED ORDER — ADULT MULTIVITAMIN W/MINERALS CH
1.0000 | ORAL_TABLET | Freq: Every day | ORAL | Status: DC
  Administered 2022-08-28: 1 via ORAL
  Filled 2022-08-27: qty 1

## 2022-08-27 MED ORDER — ACETAMINOPHEN 500 MG PO TABS
1000.0000 mg | ORAL_TABLET | Freq: Once | ORAL | Status: AC
  Administered 2022-08-27: 1000 mg via ORAL
  Filled 2022-08-27: qty 2

## 2022-08-27 MED ORDER — LORAZEPAM 1 MG PO TABS
1.0000 mg | ORAL_TABLET | ORAL | Status: DC | PRN
  Administered 2022-08-28: 2 mg via ORAL
  Administered 2022-08-28 – 2022-08-29 (×4): 1 mg via ORAL
  Administered 2022-08-29: 3 mg via ORAL
  Administered 2022-08-29: 1 mg via ORAL
  Administered 2022-08-29: 2 mg via ORAL
  Filled 2022-08-27: qty 1
  Filled 2022-08-27: qty 3
  Filled 2022-08-27 (×4): qty 1
  Filled 2022-08-27 (×2): qty 2

## 2022-08-27 MED ORDER — THIAMINE HCL 100 MG/ML IJ SOLN: 100.0000 mg | Freq: Every day | INTRAMUSCULAR | Status: DC

## 2022-08-27 MED ORDER — THIAMINE MONONITRATE 100 MG PO TABS
100.0000 mg | ORAL_TABLET | Freq: Every day | ORAL | Status: DC
  Administered 2022-08-28: 100 mg via ORAL
  Filled 2022-08-27: qty 1

## 2022-08-27 MED ORDER — NICOTINE 21 MG/24HR TD PT24
21.0000 mg | MEDICATED_PATCH | Freq: Once | TRANSDERMAL | Status: AC
  Administered 2022-08-27: 21 mg via TRANSDERMAL
  Filled 2022-08-27: qty 1

## 2022-08-27 MED ORDER — LORAZEPAM 2 MG/ML IJ SOLN: 1.0000 mg | INTRAMUSCULAR | Status: DC | PRN

## 2022-08-27 MED ORDER — ZIPRASIDONE MESYLATE 20 MG IM SOLR
20.0000 mg | Freq: Once | INTRAMUSCULAR | Status: AC
  Administered 2022-08-27: 20 mg via INTRAMUSCULAR
  Filled 2022-08-27: qty 20

## 2022-08-27 MED ORDER — AMLODIPINE BESYLATE 5 MG PO TABS
10.0000 mg | ORAL_TABLET | Freq: Every day | ORAL | Status: DC
  Administered 2022-08-27 – 2022-08-28 (×2): 10 mg via ORAL
  Filled 2022-08-27 (×2): qty 2

## 2022-08-27 MED ORDER — AMOXICILLIN 250 MG PO CAPS
500.0000 mg | ORAL_CAPSULE | Freq: Three times a day (TID) | ORAL | Status: DC
  Administered 2022-08-27 – 2022-08-28 (×4): 500 mg via ORAL
  Filled 2022-08-27 (×4): qty 2
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
## 2022-08-27 NOTE — ED Notes (Signed)
Faxed IVC papers to [facility].

## 2022-08-28 DIAGNOSIS — F1090 Alcohol use, unspecified, uncomplicated: Secondary | ICD-10-CM

## 2022-08-28 DIAGNOSIS — F250 Schizoaffective disorder, bipolar type: Secondary | ICD-10-CM

## 2022-08-28 DIAGNOSIS — R45851 Suicidal ideations: Secondary | ICD-10-CM

## 2022-08-28 MED ORDER — NICOTINE 21 MG/24HR TD PT24
21.0000 mg | MEDICATED_PATCH | Freq: Once | TRANSDERMAL | Status: DC
  Administered 2022-08-28: 21 mg via TRANSDERMAL
  Filled 2022-08-28: qty 1

## 2022-08-28 MED ORDER — GABAPENTIN 400 MG PO CAPS
400.0000 mg | ORAL_CAPSULE | Freq: Three times a day (TID) | ORAL | Status: DC
  Administered 2022-08-28 – 2022-08-29 (×3): 400 mg via ORAL
  Filled 2022-08-28 (×3): qty 1

## 2022-08-28 MED ORDER — ACETAMINOPHEN 500 MG PO TABS
1000.0000 mg | ORAL_TABLET | Freq: Once | ORAL | Status: AC
  Administered 2022-08-28: 1000 mg via ORAL
  Filled 2022-08-28: qty 2

## 2022-08-28 MED ORDER — TRAZODONE HCL 50 MG PO TABS
50.0000 mg | ORAL_TABLET | Freq: Every evening | ORAL | Status: DC | PRN
  Administered 2022-08-28: 50 mg via ORAL
  Filled 2022-08-28: qty 1

## 2022-08-28 MED ORDER — CLONIDINE HCL 0.1 MG PO TABS
0.1000 mg | ORAL_TABLET | Freq: Two times a day (BID) | ORAL | Status: DC
  Administered 2022-08-28 (×2): 0.1 mg via ORAL
  Filled 2022-08-28 (×2): qty 1

## 2022-08-28 MED ORDER — STERILE WATER FOR INJECTION IJ SOLN
INTRAMUSCULAR | Status: AC
  Filled 2022-08-28: qty 10

## 2022-08-28 MED ORDER — ZIPRASIDONE MESYLATE 20 MG IM SOLR
20.0000 mg | Freq: Once | INTRAMUSCULAR | Status: AC
  Administered 2022-08-28: 20 mg via INTRAMUSCULAR
  Filled 2022-08-28: qty 20

## 2022-08-28 MED ORDER — ARIPIPRAZOLE 5 MG PO TABS
15.0000 mg | ORAL_TABLET | Freq: Every day | ORAL | Status: DC
  Administered 2022-08-28: 15 mg via ORAL
  Filled 2022-08-28: qty 3

## 2022-08-28 MED ORDER — ACETAMINOPHEN 325 MG PO TABS
650.0000 mg | ORAL_TABLET | Freq: Four times a day (QID) | ORAL | Status: DC | PRN
  Administered 2022-08-28 – 2022-08-29 (×2): 650 mg via ORAL
  Filled 2022-08-28 (×2): qty 2

## 2022-08-28 MED ORDER — TIZANIDINE HCL 4 MG PO TABS
4.0000 mg | ORAL_TABLET | Freq: Three times a day (TID) | ORAL | Status: DC | PRN
  Administered 2022-08-28 – 2022-08-29 (×3): 4 mg via ORAL
  Filled 2022-08-28 (×3): qty 1

## 2022-08-28 NOTE — Progress Notes (Signed)
Per Leandro Reasoner, NP, patient meets criteria for inpatient treatment. There are no available beds at [facility] today. CSW faxed referrals to the following facilities for review:  [address] [place] [address] [phone] --  [place] [facility] [address], [place] [address] [phone] --  [address] [place] [address] [phone] --  [address] [place] [address] [phone] --  [place]  Pending - Request Sent N/A [place], [place] [address] [phone] --  [facility]  Pending - Request Sent N/A [address] [place] [address] [place] [facility] N/A [address] [place] [address] [phone] --  [place] [address] [place] [address] [phone] --  [place] [address], [place] [address] [phone] --  [place] [address], [place] [address] [phone] --  [place] N[facility]A [address] [place] [address] [phone] --  [place] [address], [place] [address] [phone] --  [place] N/[facility] [address] [place] [address] [phone] --   TTS will continue to seek bed placement.  Glennie Isle, MSW,  Laurence Compton Phone: [phone] Disposition/TOC
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
## 2022-08-28 NOTE — ED Provider Notes (Addendum)
Emergency Medicine Observation Re-evaluation Note  Lance Abbott is a 41 y.o. male, seen on rounds today.  Pt initially presented to the ED for complaints of Suicidal Currently, the patient is awaiting a psychiatric evaluation.Marland Kitchen  Physical Exam  BP 103/74   Pulse 68   Temp 97.9 F (36.6 C) (Oral)   Resp 20   Ht 1.727 m (5\' 8" )   Wt 97.5 kg   SpO2 98%   BMI 32.69 kg/m  Physical Exam General: Nontoxic no acute distress Cardiac:  Lungs: No respiratory distress Psych: Still having hallucinations hearing voices  ED Course / MDM  EKG:   I have reviewed the labs performed to date as well as medications administered while in observation.  Recent changes in the last 24 hours include patient requesting Geodon.  Patient had an allergy listed for Geodon.  However has had it many times in the past.  Does not appear to be a true allergy.  Will give Geodon.  Plan  Current plan is for awaiting behavioral health evaluation.  Patient is IVC.Marland Kitchen    Fredia Sorrow, MD 08/28/22 [place], [place], MD 08/28/22 [phone]

## 2022-08-28 NOTE — Progress Notes (Signed)
Per Isaias Cowman admission, pt has been accepted to [facility]. Accepting provider is Dr. Enzo Bi. Patient can arrive 08/29/2022 after 9:00am. Number for report is [phone].   Glennie Isle, MSW, LCSW-A Phone: [phone] Disposition/TOC

## 2022-08-29 NOTE — ED Notes (Signed)
Pt sitting up in bed, pt reports visual hallucinations, pt states that she is seeing angles and demons, pt calm and cooperative, pt states that he is excited to go to the facility this morning.  Pt knows name, month and year.  Pt answering questions appropriately.

## 2022-08-29 NOTE — ED Notes (Signed)
Pt in bed with eyes closed, pt arouses to verbal stim, pt states that he is feeling better, pt states that he is ready to go, cwia complete, 1mg  ativan given, pt's belongings given to PD, pt ambulatory from dpt with officer.

## 2022-09-23 ENCOUNTER — Telehealth: Payer: Self-pay
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
## 2022-09-27 ENCOUNTER — Ambulatory Visit: Payer: Self-pay | Admitting: Physician Assistant

## 2022-10-03 ENCOUNTER — Telehealth: Payer: Self-pay

## 2022-10-03 NOTE — Telephone Encounter (Signed)
Called client to follow up as he was scheduled to establish primary medical care with [facility] last week. He states he missed his appointment. Client at his request was sent a text message with the number to [facility] so that he can call to reschedule tomorrow for a new appointment.   Will plan to follow up with client to determine if he has rescheduled.   Francee Nodal RN [employer]

## 2022-10-06 ENCOUNTER — Telehealth: Payer: Self-pay

## 2022-10-06 NOTE — Telephone Encounter (Signed)
Attempted to call to follow up regarding client rescheduling his [facility] appointment, person answering the phone states he was "walking the dog" and would tell him to call back.  Francee Nodal RN [employer]

## 2022-10-10 ENCOUNTER — Telehealth: Payer: Self-pay

## 2022-10-10 NOTE — Telephone Encounter (Signed)
Called to follow up with [facility] client as he has not called to reschedule his new patient appointment to establish care. Text messaged client with [facility] phone number and encouraged him to call.   Francee Nodal RN [employer]

## 2022-10-11 ENCOUNTER — Emergency Department (HOSPITAL_COMMUNITY)
Admission: EM | Admit: 2022-10-11 | Discharge: 2022-10-11 | Disposition: A | Discharge status: Home / Self Care | Payer: Self-pay | Attending: Emergency Medicine | Admitting: Emergency Medicine

## 2022-10-11 ENCOUNTER — Encounter (HOSPITAL_COMMUNITY): Payer: Self-pay | Admitting: Emergency Medicine

## 2022-10-11 ENCOUNTER — Emergency Department (HOSPITAL_COMMUNITY): Payer: Self-pay

## 2022-10-11 ENCOUNTER — Other Ambulatory Visit: Payer: Self-pay

## 2022-10-11 DIAGNOSIS — S39012A Strain of muscle, fascia and tendon of lower back, initial encounter: Secondary | ICD-10-CM | POA: Insufficient documentation

## 2022-10-11 DIAGNOSIS — Y92810 Car as the place of occurrence of the external cause: Secondary | ICD-10-CM | POA: Insufficient documentation

## 2022-10-11 DIAGNOSIS — W228XXA Striking against or struck by other objects, initial encounter: Secondary | ICD-10-CM | POA: Insufficient documentation

## 2022-10-11 DIAGNOSIS — Y9301 Activity, walking, marching and hiking: Secondary | ICD-10-CM | POA: Insufficient documentation

## 2022-10-11 MED ORDER — NICOTINE 21 MG/24HR TD PT24: 21.0000 mg | MEDICATED_PATCH | Freq: Every day | TRANSDERMAL | 0 refills | Status: AC

## 2022-10-11 MED ORDER — IBUPROFEN 600 MG PO TABS: 600.0000 mg | ORAL_TABLET | Freq: Four times a day (QID) | ORAL | 0 refills | Status: AC | PRN

## 2022-10-11 MED ORDER — CYCLOBENZAPRINE HCL 10 MG PO TABS: 10.0000 mg | ORAL_TABLET | Freq: Two times a day (BID) | ORAL | 0 refills | Status: AC | PRN

## 2022-10-11 MED ORDER — IBUPROFEN 800 MG PO TABS
800.0000 mg | ORAL_TABLET | Freq: Once | ORAL | Status: AC
  Administered 2022-10-11: 800 mg via ORAL
  Filled 2022-10-11: qty 1
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
## 2022-10-11 NOTE — Discharge Instructions (Signed)
You have been evaluated for your injury.  Fortunately x-ray did not show any broken bone.  Please take medication prescribed as needed for symptom control.  You may follow-up with orthopedist doctor if your symptoms persist.
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
## 2022-10-14 ENCOUNTER — Encounter (HOSPITAL_COMMUNITY): Payer: Self-pay

## 2022-10-14 ENCOUNTER — Other Ambulatory Visit: Payer: Self-pay

## 2022-10-14 DIAGNOSIS — Z760 Encounter for issue of repeat prescription: Secondary | ICD-10-CM | POA: Insufficient documentation

## 2022-10-14 DIAGNOSIS — Z5321 Procedure and treatment not carried out due to patient leaving prior to being seen by health care provider: Secondary | ICD-10-CM | POA: Insufficient documentation

## 2022-10-14 NOTE — ED Triage Notes (Signed)
Pt reports he has been out of his mental health medications for several weeks. Pt requesting refills of all his regular medications.

## 2022-10-15 ENCOUNTER — Encounter (HOSPITAL_COMMUNITY): Payer: Self-pay | Admitting: *Deleted

## 2022-10-15 ENCOUNTER — Other Ambulatory Visit: Payer: Self-pay

## 2022-10-15 ENCOUNTER — Emergency Department (HOSPITAL_COMMUNITY)
Admission: EM | Admit: 2022-10-15 | Discharge: 2022-10-15 | Discharge status: Against Medical Advice | Payer: Self-pay | Attending: Emergency Medicine | Admitting: Emergency Medicine

## 2022-10-15 ENCOUNTER — Emergency Department (HOSPITAL_COMMUNITY)
Admission: EM | Admit: 2022-10-15 | Discharge: 2022-10-17 | Disposition: A | Discharge status: Other Acute Inpatient Hospital | Payer: No Payment, Other | Attending: Student | Admitting: Student

## 2022-10-15 DIAGNOSIS — F1090 Alcohol use, unspecified, uncomplicated: Secondary | ICD-10-CM

## 2022-10-15 DIAGNOSIS — Y907 Blood alcohol level of 200-239 mg/100 ml: Secondary | ICD-10-CM | POA: Insufficient documentation

## 2022-10-15 DIAGNOSIS — I10 Essential (primary) hypertension: Secondary | ICD-10-CM | POA: Insufficient documentation

## 2022-10-15 DIAGNOSIS — R4781 Slurred speech: Secondary | ICD-10-CM | POA: Insufficient documentation

## 2022-10-15 DIAGNOSIS — F1210 Cannabis abuse, uncomplicated: Secondary | ICD-10-CM | POA: Insufficient documentation

## 2022-10-15 DIAGNOSIS — Z79899 Other long term (current) drug therapy: Secondary | ICD-10-CM | POA: Insufficient documentation

## 2022-10-15 DIAGNOSIS — R45851 Suicidal ideations: Secondary | ICD-10-CM | POA: Insufficient documentation

## 2022-10-15 DIAGNOSIS — F250 Schizoaffective disorder, bipolar type: Secondary | ICD-10-CM | POA: Insufficient documentation

## 2022-10-15 DIAGNOSIS — F419 Anxiety disorder, unspecified: Secondary | ICD-10-CM | POA: Insufficient documentation

## 2022-10-15 DIAGNOSIS — Z20822 Contact with and (suspected) exposure to covid-19: Secondary | ICD-10-CM | POA: Insufficient documentation

## 2022-10-15 DIAGNOSIS — F1099 Alcohol use, unspecified with unspecified alcohol-induced disorder: Secondary | ICD-10-CM | POA: Insufficient documentation

## 2022-10-15 LAB — COMPREHENSIVE METABOLIC PANEL
ALT: 24 U/L (ref 0–44)
AST: 21 U/L (ref 15–41)
Albumin: 4.8 g/dL (ref 3.5–5.0)
Alkaline Phosphatase: 75 U/L (ref 38–126)
Anion gap: 10 (ref 5–15)
BUN: 14 mg/dL (ref 6–20)
CO2: 22 mmol/L (ref 22–32)
Calcium: 9.4 mg/dL (ref 8.9–10.3)
Chloride: 106 mmol/L (ref 98–111)
Creatinine, Ser: 0.93 mg/dL (ref 0.61–1.24)
GFR, Estimated: >60 mL/min (ref >60)
Glucose, Bld: 114 mg/dL — ABNORMAL HIGH (ref 70–99)
Potassium: 4.3 mmol/L (ref 3.5–5.1)
Sodium: 138 mmol/L (ref 135–145)
Total Bilirubin: 0.4 mg/dL (ref 0.3–1.2)
Total Protein: 7.8 g/dL (ref 6.5–8.1)

## 2022-10-15 LAB — CBC WITH DIFFERENTIAL/PLATELET
Abs Immature Granulocytes: 0.07 10*3/uL (ref 0.00–0.07)
Basophils Absolute: 0 10*3/uL (ref 0.0–0.1)
Basophils Relative: 0 %
Eosinophils Absolute: 0 10*3/uL (ref 0.0–0.5)
Eosinophils Relative: 0 %
HCT: 44.9 % (ref 39.0–52.0)
Hemoglobin: 15.3 g/dL (ref 13.0–17.0)
Immature Granulocytes: 1 %
Lymphocytes Relative: 11 %
Lymphs Abs: 1.1 10*3/uL (ref 0.7–4.0)
MCH: 31.8 pg (ref 26.0–34.0)
MCHC: 34.1 g/dL (ref 30.0–36.0)
MCV: 93.3 fL (ref 80.0–100.0)
Monocytes Absolute: 0.3 10*3/uL (ref 0.1–1.0)
Monocytes Relative: 3 %
Neutro Abs: 8.7 10*3/uL — ABNORMAL HIGH (ref 1.7–7.7)
Neutrophils Relative %: 85 %
Platelets: 332 10*3/uL (ref 150–400)
RBC: 4.81 MIL/uL (ref 4.22–5.81)
RDW: 13.2 % (ref 11.5–15.5)
WBC: 10.1 10*3/uL (ref 4.0–10.5)
nRBC: 0 % (ref 0.0–0.2)

## 2022-10-15 LAB — ETHANOL: Alcohol, Ethyl (B): 233 mg/dL — ABNORMAL HIGH (ref <10)

## 2022-10-15 LAB — RAPID URINE DRUG SCREEN, HOSP PERFORMED
Amphetamines: NOT DETECTED
Barbiturates: NOT DETECTED
Benzodiazepines: NOT DETECTED
Cocaine: NOT DETECTED
Opiates: NOT DETECTED
Tetrahydrocannabinol: POSITIVE — AB

## 2022-10-15 MED ORDER — LORAZEPAM 2 MG/ML IJ SOLN: 0.0000 mg | Freq: Four times a day (QID) | INTRAMUSCULAR | Status: AC

## 2022-10-15 MED ORDER — LORAZEPAM 1 MG PO TABS: 0.0000 mg | ORAL_TABLET | Freq: Two times a day (BID) | ORAL | Status: DC

## 2022-10-15 MED ORDER — LORAZEPAM 1 MG PO TABS
0.0000 mg | ORAL_TABLET | Freq: Four times a day (QID) | ORAL | Status: AC
  Administered 2022-10-15 – 2022-10-16 (×2): 2 mg via ORAL
  Administered 2022-10-16 – 2022-10-17 (×6): 1 mg via ORAL
  Filled 2022-10-15: qty 2
  Filled 2022-10-15 (×2): qty 1
  Filled 2022-10-15: qty 2
  Filled 2022-10-15 (×3): qty 1
  Filled 2022-10-15: qty 2

## 2022-10-15 MED ORDER — THIAMINE MONONITRATE 100 MG PO TABS
100.0000 mg | ORAL_TABLET | Freq: Every day | ORAL | Status: DC
  Administered 2022-10-15 – 2022-10-17 (×3): 100 mg via ORAL
  Filled 2022-10-15 (×3): qty 1

## 2022-10-15 MED ORDER — LORAZEPAM 2 MG/ML IJ SOLN: 0.0000 mg | Freq: Two times a day (BID) | INTRAMUSCULAR | Status: DC

## 2022-10-15 MED ORDER — THIAMINE HCL 100 MG/ML IJ SOLN: 100.0000 mg | Freq: Every day | INTRAMUSCULAR | Status: DC
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
## 2022-10-15 NOTE — ED Notes (Signed)
Pt given meal tray and drink.

## 2022-10-15 NOTE — ED Notes (Signed)
Pt ate dinner tray.
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
## 2022-10-15 NOTE — ED Notes (Signed)
Sitter at the bedside.

## 2022-10-15 NOTE — ED Notes (Signed)
Called for Pt from waiting area. No response.

## 2022-10-15 NOTE — ED Provider Notes (Signed)
[facility] EMERGENCY DEPARTMENT Provider Note   CSN: [id number] Arrival date & time: 10/15/22  1801     History  Chief Complaint  Patient presents with   V70.1    Lance Abbott is a 41 y.o. male presenting today with suicidal ideation.  He reports that his mother died recently and him and his brother had a suicide pact.  They were leaving the house to walk into traffic and his father heard them and said that they needed to call 911 and come here to get help.  Says that now he is not having any thoughts of hurting himself just would like his mental health medications.  Reports that he has been out of all of them.  He presented to the emergency department for refill yesterday and ended up eloping from the waiting room after a stranger took him to have alcoholic drinks.  He says that he drinks "too much."  Has been drinking today.  HPI     Home Medications Prior to Admission medications   Medication Sig Start Date End Date Taking? Authorizing Provider  ARIPiprazole (ABILIFY) 15 MG tablet Take 1 tablet (15 mg total) by mouth daily for 14 days. Patient taking differently: Take 15 mg by mouth daily. Pt gets 10 mg from family 02/09/22 02/23/22  Massengill, Harrold Donath, MD  ARIPiprazole ER (ABILIFY MAINTENA) 400 MG SRER injection Inject 2 mLs (400 mg total) into the muscle every 28 (twenty-eight) days. 03/07/22   Massengill, Harrold Donath, MD  buPROPion (WELLBUTRIN XL) 300 MG 24 hr tablet Take 300 mg by mouth daily. 09/06/22   [provider]  busPIRone (BUSPAR) 15 MG tablet Take 15 mg by mouth 3 (three) times daily. 09/06/22   [provider]  cyclobenzaprine (FLEXERIL) 10 MG tablet Take 1 tablet (10 mg total) by mouth 2 (two) times daily as needed for muscle spasms. 10/11/22   Fayrene Helper, PA-C  HYDROcodone-acetaminophen (NORCO) 10-325 MG tablet Take 1 tablet by mouth in the morning, at noon, and at bedtime.    [provider]  ibuprofen (ADVIL) 600 MG tablet Take 1 tablet (600 mg  total) by mouth every 6 (six) hours as needed. 10/11/22   Fayrene Helper, PA-C  naproxen (NAPROSYN) 500 MG tablet Take 1 tablet (500 mg total) by mouth 2 (two) times daily. Patient not taking: Reported on 08/28/2022 07/11/22   Cecil Cobbs, PA-C  nicotine (NICODERM CQ - DOSED IN MG/24 HOURS) 21 mg/24hr patch Place 1 patch (21 mg total) onto the skin daily. 10/11/22   Fayrene Helper, PA-C  propranolol (INDERAL) 10 MG tablet Take 10 mg by mouth 3 (three) times daily. 09/06/22   [provider]  QUEtiapine (SEROQUEL) 400 MG tablet Take 1 tablet (400 mg total) by mouth at bedtime. 02/08/22 08/28/22  Massengill, Harrold Donath, MD  tiZANidine (ZANAFLEX) 4 MG capsule Take 4 mg by mouth 3 (three) times daily.    [provider]  traZODone (DESYREL) 100 MG tablet Take 100 mg by mouth at bedtime.    [provider]      Allergies    Cogentin [benztropine], Zyprexa [olanzapine], Celexa [citalopram hydrobromide], Depakote [valproic acid], Effexor [venlafaxine], Geodon [ziprasidone hcl], Haldol [haloperidol], Hydrochlorothiazide, Lasix [furosemide], Lexapro [escitalopram], Lisinopril, Lithium, Thorazine [chlorpromazine], Topamax [topiramate], and Tramadol    Review of Systems   Review of Systems  Physical Exam Updated Vital Signs BP (!) 138/94 (BP Location: Left Arm)   Pulse (!) 117   Temp 98.2 F (36.8 C) (Oral)   Resp 18  SpO2 94%  Physical Exam Vitals and nursing note reviewed.  Constitutional:      Appearance: Normal appearance.  HENT:     Head: Normocephalic and atraumatic.  Eyes:     General: No scleral icterus.    Conjunctiva/sclera: Conjunctivae normal.  Pulmonary:     Effort: Pulmonary effort is normal. No respiratory distress.  Skin:    Findings: No rash.  Neurological:     Mental Status: He is alert.     Comments: Mildly slurred speech, smells of alcohol  Psychiatric:        Mood and Affect: Mood normal.     ED Results / Procedures / Treatments   Labs (all  labs ordered are listed, but only abnormal results are displayed) Labs Reviewed  COMPREHENSIVE METABOLIC PANEL  ETHANOL  RAPID URINE DRUG SCREEN, HOSP PERFORMED  CBC WITH DIFFERENTIAL/PLATELET    EKG None  Radiology No results found.  Procedures Procedures   Medications Ordered in ED Medications  LORazepam (ATIVAN) injection 0-4 mg (has no administration in time range)    Or  LORazepam (ATIVAN) tablet 0-4 mg (has no administration in time range)  LORazepam (ATIVAN) injection 0-4 mg (has no administration in time range)    Or  LORazepam (ATIVAN) tablet 0-4 mg (has no administration in time range)  thiamine (VITAMIN B1) tablet 100 mg (has no administration in time range)    Or  thiamine (VITAMIN B1) injection 100 mg (has no administration in time range)    ED Course/ Medical Decision Making/ A&P                           Medical Decision Making Amount and/or Complexity of Data Reviewed Labs: ordered.  Risk OTC drugs. Prescription drug management.   41 year old male presenting with his brother due to suicidal ideation and behavior.  Brought in by police department after patient planned to jump in front of a car.  Says that he has not had any of his behavioral health medications for a month.  Physical exam: Patient slightly slurring his speech and smells of alcohol.  Workup: Medical clearance with an ethanol of 233 and positive for THC as expected  Treatment: Placed on CIWA precautions due to alcohol use disorder  MDM/disposition: 41 year old male with a past medical history of suicidal ideation, schizoaffective disorder and alcohol use disorder presenting today with suicidal behavior.  Patient does have an elevated ethanol level however he likely has an elevated tolerance.  He is walking and talking and alert and oriented.  Clinically able to participate in TTS evaluation.  D Diagnoses Final diagnoses:  Suicidal ideation  Alcohol use disorder    Rx / DC  Orders ED Discharge Orders     None     Psych team to dispo pt.    Woodroe Chen 10/15/22 1949    Terrilee Files, MD 10/16/22 1029

## 2022-10-15 NOTE — ED Notes (Signed)
Pt observed walking out of waiting area and getting in a vehicle of another Pt who was also in waiting area.

## 2022-10-15 NOTE — ED Triage Notes (Signed)
Pt admits to drinking ETOH today, unsteady gait.  Pt with SI-plan to run out in front of a car, made a suicide pact with his brother.  Pt states he has not been on his medications for a month due to finances.

## 2022-10-15 NOTE — ED Notes (Signed)
Gave juice

## 2022-10-15 NOTE — BH Assessment (Signed)
Binnie Rail, [facility] states the [address]-hall is currently at capacity.   Pamalee Leyden, [facility], [facility] Triage Specialist [phone]

## 2022-10-16 DIAGNOSIS — R45851 Suicidal ideations: Secondary | ICD-10-CM

## 2022-10-16 MED ORDER — QUETIAPINE FUMARATE 100 MG PO TABS
400.0000 mg | ORAL_TABLET | Freq: Every day | ORAL | Status: DC
  Administered 2022-10-16 (×2): 400 mg via ORAL
  Filled 2022-10-16 (×2): qty 4

## 2022-10-16 MED ORDER — HYDROCODONE-ACETAMINOPHEN 10-325 MG PO TABS
1.0000 | ORAL_TABLET | Freq: Four times a day (QID) | ORAL | Status: DC | PRN
  Administered 2022-10-17 (×2): 1 via ORAL
  Filled 2022-10-16 (×2): qty 1

## 2022-10-16 MED ORDER — ARIPIPRAZOLE 5 MG PO TABS
10.0000 mg | ORAL_TABLET | Freq: Every day | ORAL | Status: DC
  Administered 2022-10-16 – 2022-10-17 (×2): 10 mg via ORAL
  Filled 2022-10-16 (×2): qty 2

## 2022-10-16 MED ORDER — IBUPROFEN 400 MG PO TABS
600.0000 mg | ORAL_TABLET | Freq: Four times a day (QID) | ORAL | Status: DC | PRN
  Administered 2022-10-16: 600 mg via ORAL
  Filled 2022-10-16: qty 2

## 2022-10-16 MED ORDER — NICOTINE 21 MG/24HR TD PT24
21.0000 mg | MEDICATED_PATCH | Freq: Every day | TRANSDERMAL | Status: DC
  Administered 2022-10-16 – 2022-10-17 (×2): 21 mg via TRANSDERMAL
  Filled 2022-10-16 (×2): qty 1

## 2022-10-16 MED ORDER — TIZANIDINE HCL 4 MG PO TABS
4.0000 mg | ORAL_TABLET | Freq: Three times a day (TID) | ORAL | Status: DC | PRN
  Administered 2022-10-16 (×2): 4 mg via ORAL
  Filled 2022-10-16 (×2): qty 1

## 2022-10-16 MED ORDER — CYCLOBENZAPRINE HCL 10 MG PO TABS: 10.0000 mg | ORAL_TABLET | Freq: Two times a day (BID) | ORAL | Status: DC | PRN

## 2022-10-16 MED ORDER — BUSPIRONE HCL 5 MG PO TABS
15.0000 mg | ORAL_TABLET | Freq: Three times a day (TID) | ORAL | Status: DC
  Administered 2022-10-16 – 2022-10-17 (×4): 15 mg via ORAL
  Filled 2022-10-16 (×4): qty 3

## 2022-10-16 MED ORDER — TRAZODONE HCL 50 MG PO TABS
100.0000 mg | ORAL_TABLET | Freq: Every day | ORAL | Status: DC
  Administered 2022-10-16 (×2): 100 mg via ORAL
  Filled 2022-10-16 (×2): qty 2

## 2022-10-16 MED ORDER — ARIPIPRAZOLE ER 400 MG IM SRER: 400.0000 mg | INTRAMUSCULAR | Status: DC

## 2022-10-16 MED ORDER — ONDANSETRON 4 MG PO TBDP
4.0000 mg | ORAL_TABLET | Freq: Once | ORAL | Status: DC
  Filled 2022-10-16: qty 1

## 2022-10-16 MED ORDER — PROPRANOLOL HCL 10 MG PO TABS
10.0000 mg | ORAL_TABLET | Freq: Three times a day (TID) | ORAL | Status: DC
  Administered 2022-10-16 – 2022-10-17 (×4): 10 mg via ORAL
  Filled 2022-10-16 (×4): qty 1

## 2022-10-16 MED ORDER — BUPROPION HCL ER (XL) 150 MG PO TB24
300.0000 mg | ORAL_TABLET | Freq: Every day | ORAL | Status: DC
  Administered 2022-10-16 – 2022-10-17 (×2): 300 mg via ORAL
  Filled 2022-10-16 (×2): qty 2

## 2022-10-16 NOTE — ED Notes (Signed)
Lunch tray given. 
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
## 2022-10-16 NOTE — Progress Notes (Signed)
CSW spoke with [facility] admissions. It was reported that the patient is under further review for possible placement at this time.   Crissie Reese, MSW, Lenice Pressman Phone: [phone] Disposition/TOC

## 2022-10-16 NOTE — Progress Notes (Signed)
CSW spoke with Beth with [facility]. It was reported that the patient is under further review.  Krikor Willet, MSW, LCSW-A, LCAS Phone: [phone] Disposition/TOC  

## 2022-10-16 NOTE — ED Notes (Signed)
Breakfast tray given. °

## 2022-10-16 NOTE — Progress Notes (Signed)
CSW spoke with [facility] with [facility]. It was reported that patient has been declined due to no appropriate beds available at this time.   Crissie Reese, MSW, Lenice Pressman Phone: [phone] Disposition/TOC

## 2022-10-16 NOTE — Progress Notes (Signed)
Per Cecilio Asper, NP, patient meets criteria for inpatient treatment. There are no available beds at [facility] today. CSW faxed referrals to the following facilities for review:  [facility]  Pending - Request Sent N/A [address], [place] [address] [phone] --  [facility]  Pending - Request Sent N/A [address], [place] [address] [phone] --  [facility]  Pending - Request Sent N/A [address] [place] [address] [phone] --  [facility]  Pending - Request Sent N/A [address] [place] [address] [phone] --  [facility]  Pending - Request Sent N/A [address] [place] [address] [phone] --  [facility]  Pending - Request Sent N/A [address] [place], [place] [address] [phone] --  [facility]  Pending - Request Sent N/A [address] [place] [address] [phone] --  [facility]  Pending - Request Sent N/A [address] [place] [address] [phone] --  [facility]  Pending - Request Sent N/A [address] [place] [address] [phone] --  [facility]  Pending - Request Sent N/A [address], [place] [address] [phone] --  [facility]  Pending - Request Sent N/A [address] [place] [address] [phone] --  [facility]  Pending - Request Sent N/A [address], [place] [address] [phone] --  [facility]  Pending - Request Sent N/A [address] [place] [address] [phone] --  [facility]  Pending - Request Sent N/A [address] [place] [address] [phone] --   TTS will continue to seek bed placement.  Crissie Reese, MSW, Lenice Pressman Phone: [phone] Disposition/TOC

## 2022-10-17 LAB — SARS CORONAVIRUS 2 BY RT PCR: SARS Coronavirus 2 by RT PCR: NEGATIVE

## 2022-10-17 NOTE — Progress Notes (Signed)
LCSW Progress Note  [id number]   Lance Abbott  10/17/2022  10:16 AM  Description:   Inpatient Psychiatric Referral  Patient was recommended inpatient per  Cecilie Lowers, FNP. There are no available beds at [facility]. Patient was referred to the following facilities:    Destination Service Provider Address Phone Fax  [facility]  [address], [place] [address] [phone]  [facility] [address] [place]  [address], [place] [address] [phone]  [facility]  [address], [place] [address] [phone]  [facility]  [address], [place] [address] [phone]  [facility]  [address] [place], [place] [address] [phone]  [facility]  [address] [place], [place] [address] [phone]  [facility]  [address] [place] [address] [phone]  [facility]  [address] [place] [address] [phone]  [facility]  [address] [place] [address] [phone]  [facility]  [address], [place] [address] [phone]  [facility]  [address], [place] [address] [phone]  [facility]  [address] [place] [address] [phone]  [facility]  [address], [place] [address] [phone]  [facility]  [address] [place] [address] [phone]  [facility]  [address] [place] [address] [phone]  [facility]  [address] [place] [address] [phone]   [facility]  [address] [place], [place] [address] [phone]  [facility]  [address] [place] [address] [phone]  [facility]  [address] [place] [address] [phone]  [facility]  [address] [place] [address] [phone]  [facility]  [address] [place], [place] [address] [phone]  [facility]  [address] [place] [address] [phone]     Situation ongoing, CSW to continue following and update chart as more information becomes available.      Cathie Beams, [place]  10/17/2022 10:16 AM

## 2022-10-17 NOTE — Progress Notes (Signed)
Pt was accepted to [facility] TODAY 10/17/2022, pending negative covid.  Pt meets inpatient criteria per Alan Mulder, FNP  Attending Physician will be Luberta Mutter, MD  Report can be called to: [phone]  Pt can arrive after pending items are received  Care Team Notified: Alan Mulder, FNP, Iona Coach, RN, Edythe Clarity, RN, [facility], RN, and [place], LCSWA  [place], [place]  10/17/2022 12:17 PM

## 2022-10-17 NOTE — Discharge Instructions (Signed)
Transfer patient to [facility] reginal

## 2022-10-17 NOTE — Progress Notes (Signed)
CSW completed the [place] [address] transportation form. CSW sent form via email to [facility]. CSW notified Iona Coach, RN of form being completed.  Cathie Beams, [place]  10/17/2022 4:04 PM

## 2022-10-25 ENCOUNTER — Telehealth: Payer: Self-pay

## 2022-10-25 NOTE — Telephone Encounter (Signed)
Attempted ER follow up call and to follow up regarding rescheduling of client [facility]. No answer and unable to leave a voicemail due to being full. Sent SMS text to call [facility].   Francee Nodal RN [employer]

## 2022-10-27 ENCOUNTER — Encounter (HOSPITAL_COMMUNITY): Payer: Self-pay | Admitting: Emergency Medicine

## 2022-10-27 ENCOUNTER — Emergency Department (HOSPITAL_COMMUNITY)
Admission: EM | Admit: 2022-10-27 | Discharge: 2022-10-27 | Disposition: A | Discharge status: Home / Self Care | Payer: Self-pay | Attending: Emergency Medicine | Admitting: Emergency Medicine

## 2022-10-27 ENCOUNTER — Other Ambulatory Visit: Payer: Self-pay

## 2022-10-27 DIAGNOSIS — Z760 Encounter for issue of repeat prescription: Secondary | ICD-10-CM | POA: Insufficient documentation

## 2022-10-27 MED ORDER — TIZANIDINE HCL 4 MG PO CAPS: 4.0000 mg | ORAL_CAPSULE | Freq: Three times a day (TID) | ORAL | 0 refills | Status: AC

## 2022-10-27 NOTE — ED Provider Notes (Signed)
[facility] EMERGENCY DEPARTMENT Provider Note   CSN: [id number] Arrival date & time: 10/27/22  1452     History  Chief Complaint  Patient presents with   Medication Refill    Lance Abbott is a 41 y.o. male.  Pt is a 41 yo male with a pmhx significant for anxiety, depression, chronic pain, polysubstance abuse, and bipolar d/o.  Pt said he was d/c from [facility] 2 days ago.  He is supposed to be on Suboxone.  However, the facility did not write for this medication.  He went to [facility] and was told the ER could write for it.  Apparently they can write for it after Jan 2, but not now.  So, he came here.  Pt is not having any w/dr sx yet.  Pt has chronic back pain and that is all that is hurting him now.       Home Medications Prior to Admission medications   Medication Sig Start Date End Date Taking? Authorizing Provider  ARIPiprazole (ABILIFY) 15 MG tablet Take 1 tablet (15 mg total) by mouth daily for 14 days. 02/09/22 12/17/22  Massengill, Harrold Donath, MD  ARIPiprazole ER (ABILIFY MAINTENA) 400 MG SRER injection Inject 2 mLs (400 mg total) into the muscle every 28 (twenty-eight) days. 03/07/22   Massengill, Harrold Donath, MD  buPROPion (WELLBUTRIN XL) 300 MG 24 hr tablet Take 300 mg by mouth daily. 09/06/22   [provider]  busPIRone (BUSPAR) 15 MG tablet Take 15 mg by mouth 3 (three) times daily. 09/06/22   [provider]  cyclobenzaprine (FLEXERIL) 10 MG tablet Take 1 tablet (10 mg total) by mouth 2 (two) times daily as needed for muscle spasms. Patient not taking: Reported on 10/16/2022 10/11/22   Fayrene Helper, PA-C  DM-Phenylephrine-Acetaminophen [facility] DAYQUIL COLD & FLU PO) Take 1 capsule by mouth daily as needed (sinus congestion, runny nose).    [provider]  ibuprofen (ADVIL) 600 MG tablet Take 1 tablet (600 mg total) by mouth every 6 (six) hours as needed. Patient taking differently: Take 600 mg by mouth every 6 (six) hours as needed for mild  pain or moderate pain. 10/11/22   Fayrene Helper, PA-C  naproxen (NAPROSYN) 500 MG tablet Take 1 tablet (500 mg total) by mouth 2 (two) times daily. Patient not taking: Reported on 10/16/2022 07/11/22   Cecil Cobbs, PA-C  NEOMYCIN-POLYMYXIN-HYDROCORTISONE (CORTISPORIN) 1 % SOLN OTIC solution Place 1-5 drops into both ears 2 (two) times daily as needed (recurrent ear infections).    [provider]  nicotine (NICODERM CQ - DOSED IN MG/24 HOURS) 21 mg/24hr patch Place 1 patch (21 mg total) onto the skin daily. 10/11/22   Fayrene Helper, PA-C  propranolol (INDERAL) 10 MG tablet Take 10 mg by mouth 3 (three) times daily. 09/06/22   [provider]  QUEtiapine (SEROQUEL) 400 MG tablet Take 1 tablet (400 mg total) by mouth at bedtime. 02/08/22 12/17/22  Massengill, Harrold Donath, MD  tiZANidine (ZANAFLEX) 4 MG capsule Take 1 capsule (4 mg total) by mouth 3 (three) times daily. 10/27/22   Jacalyn Lefevre, MD  traZODone (DESYREL) 100 MG tablet Take 100 mg by mouth at bedtime as needed (insomnia not relieved by quetiapine).    [provider]      Allergies    Cogentin [benztropine], Haldol [haloperidol], Zyprexa [olanzapine], Celexa [citalopram hydrobromide], Depakote [valproic acid], Effexor [venlafaxine], Hydrochlorothiazide, Lasix [furosemide], Lexapro [escitalopram], Lithobid [lithium], Thorazine [chlorpromazine], Topamax [topiramate], Ultram [tramadol], Vivitrol [naltrexone], and Zestril [lisinopril]  Review of Systems   Review of Systems  Musculoskeletal:  Positive for back pain.  All other systems reviewed and are negative.   Physical Exam Updated Vital Signs BP (!) 150/108 (BP Location: Right Arm)   Pulse 95   Temp 97.6 F (36.4 C) (Oral)   Resp 16   Ht 5\' 8"  (1.727 m)   Wt 97.5 kg   SpO2 99%   BMI 32.69 kg/m  Physical Exam Vitals and nursing note reviewed.  Constitutional:      Appearance: Normal appearance.  HENT:     Head: Normocephalic and atraumatic.      Right Ear: External ear normal.     Left Ear: External ear normal.     Nose: Nose normal.     Mouth/Throat:     Mouth: Mucous membranes are dry.  Eyes:     Extraocular Movements: Extraocular movements intact.     Conjunctiva/sclera: Conjunctivae normal.     Pupils: Pupils are equal, round, and reactive to light.  Cardiovascular:     Rate and Rhythm: Normal rate and regular rhythm.     Pulses: Normal pulses.     Heart sounds: Normal heart sounds.  Pulmonary:     Effort: Pulmonary effort is normal.     Breath sounds: Normal breath sounds.  Abdominal:     General: Abdomen is flat. Bowel sounds are normal.     Palpations: Abdomen is soft.  Musculoskeletal:        General: Normal range of motion.     Cervical back: Normal range of motion and neck supple.  Skin:    General: Skin is warm.     Capillary Refill: Capillary refill takes less than 2 seconds.  Neurological:     General: No focal deficit present.     Mental Status: He is alert and oriented to person, place, and time.  Psychiatric:        Mood and Affect: Mood normal.        Behavior: Behavior normal.     ED Results / Procedures / Treatments   Labs (all labs ordered are listed, but only abnormal results are displayed) Labs Reviewed - No data to display  EKG None  Radiology No results found.  Procedures Procedures    Medications Ordered in ED Medications - No data to display  ED Course/ Medical Decision Making/ A&P                           Medical Decision Making Risk Prescription drug management.   This patient presents to the ED for concern of suboxone refill, this involves an extensive number of treatment options, and is a complaint that carries with it a high risk of complications and morbidity.  The differential diagnosis includes needs med refill   Co morbidities that complicate the patient evaluation  anxiety, depression, chronic pain, polysubstance abuse, and bipolar d/o   Additional  history obtained:  Additional history obtained from epic chart review  Medicines ordered and prescription drug management:   I have reviewed the patients home medicines and have made adjustments as needed  Problem List / ED Course:  Suboxone use:  Unfortunately, I am unable to prescribe suboxone.  Pt is given a rx for zanaflex to help with his chronic pain.     Reevaluation:  After the interventions noted above, I reevaluated the patient and found that they have :improved   Social Determinants of Health:  Lives at  home   Dispostion:  After consideration of the diagnostic results and the patients response to treatment, I feel that the patent would benefit from discharge with outpatient f/u.          Final Clinical Impression(s) / ED Diagnoses Final diagnoses:  Medication refill    Rx / DC Orders ED Discharge Orders          Ordered    tiZANidine (ZANAFLEX) 4 MG capsule  3 times daily        10/27/22 1614              Jacalyn Lefevre, MD 10/27/22 1627

## 2022-10-27 NOTE — ED Triage Notes (Signed)
Pt released from [facility] 2 days ago and states they did not write a rx for his suboxone. Pt denies si/hi/avh. Nad. Calm /cooperative. [facility] went [facility] and was told they could not write the rx until jan 2. Pt denies any withdrawal symptoms. States just having his normal chronic pain.

## 2022-10-27 NOTE — Discharge Instructions (Addendum)
Suboxone clinics in [place]:  [facility]:  [phone] [facility]:  [phone]

## 2022-11-28 ENCOUNTER — Encounter (HOSPITAL_COMMUNITY): Payer: Self-pay | Admitting: Emergency Medicine

## 2022-11-28 ENCOUNTER — Other Ambulatory Visit: Payer: Self-pay

## 2022-11-28 ENCOUNTER — Emergency Department (HOSPITAL_COMMUNITY)
Admission: EM | Admit: 2022-11-28 | Discharge: 2022-11-28 | Disposition: A | Discharge status: Home / Self Care | Payer: Self-pay | Attending: Emergency Medicine | Admitting: Emergency Medicine

## 2022-11-28 DIAGNOSIS — R000 Tachycardia, unspecified: Secondary | ICD-10-CM | POA: Insufficient documentation

## 2022-11-28 MED ORDER — DIAZEPAM 5 MG/ML IJ SOLN: 5.0000 mg | Freq: Once | INTRAMUSCULAR | Status: DC

## 2022-11-28 MED ORDER — LACTATED RINGERS IV BOLUS: 1000.0000 mL | Freq: Once | INTRAVENOUS | Status: DC

## 2022-11-28 NOTE — ED Notes (Signed)
The patient removed monitoring devices and walked out of the room stating he is leaving. The patient stated he feels better. Patient was informed of the risk and was advised by staff members to stay for evaluation. Patient stated he was not staying and felt better. The patient was encouraged to stay for further monitoring. The patient still had a IV established by EMS in his left arm. This medic advised the patient that we needed to remove the IV prior to leaving. The patient continued to state he was leaving and walked briskly out of the EMS exit door. ED provider and on duty [employer] officer was informed of the matter.

## 2022-11-28 NOTE — ED Notes (Signed)
Spoke with Physicist, medical with poison control. Recommended 8 hour minimum monitoring/cardiac monitoring. Labs CBK, CMP. Fluids and Benzo as needed. Repeat EKG prior discharge.

## 2022-11-28 NOTE — ED Provider Notes (Signed)
Patient arrives via EMS.  They were called out for chest pain.  He states that he took 300 mg of Adderall in an attempt to get high.  He denies attempt of self-harm.  EMS noted blood pressure of 200/100 with tachycardia in the rate of 190 on arrival.  Chest pain was initially 8/10 in severity.  He took 3 ASA at home.  EMS gave 2 SL NTG's.  Chest pain improved to 2/10.  Patient is alert and oriented on arrival.  He appears mildly pale and tremulous.  Heart rate on arrival was 140.  EKG shows sinus tachycardia.  Blood pressure remains elevated.  IV fluids and Valium were ordered.  Remaining care to be assumed by oncoming ED provider.   Godfrey Pick, MD 11/28/22 [phone]

## 2022-11-28 NOTE — ED Triage Notes (Signed)
Pt bib EMS after he called stating he took 300mg  of his Adderall approximately 1 hr ago in an "attempt to get high". EMS states pt was c/o CP 8/10 and took 3 ASA at home prior to EMS arrival and EMS gave pt 2 SL NTG. EMS states HR was 134 upon their arrival and BP was 200/134. Pt denies SI.

## 2022-11-29 ENCOUNTER — Inpatient Hospital Stay (HOSPITAL_COMMUNITY): Payer: Self-pay

## 2022-11-29 ENCOUNTER — Emergency Department (HOSPITAL_COMMUNITY): Payer: Self-pay

## 2022-11-29 ENCOUNTER — Emergency Department (HOSPITAL_COMMUNITY)
Admission: EM | Admit: 2022-11-29 | Discharge: 2022-12-08 | DRG: 917 | Disposition: E | Payer: Self-pay | Attending: Pulmonary Disease | Admitting: Pulmonary Disease

## 2022-11-29 ENCOUNTER — Telehealth: Payer: Self-pay

## 2022-11-29 DIAGNOSIS — E8729 Other acidosis: Secondary | ICD-10-CM | POA: Diagnosis present

## 2022-11-29 DIAGNOSIS — Z79899 Other long term (current) drug therapy: Secondary | ICD-10-CM

## 2022-11-29 DIAGNOSIS — G931 Anoxic brain damage, not elsewhere classified: Secondary | ICD-10-CM | POA: Diagnosis present

## 2022-11-29 DIAGNOSIS — T43621A Poisoning by amphetamines, accidental (unintentional), initial encounter: Secondary | ICD-10-CM

## 2022-11-29 DIAGNOSIS — Z87891 Personal history of nicotine dependence: Secondary | ICD-10-CM

## 2022-11-29 DIAGNOSIS — Z888 Allergy status to other drugs, medicaments and biological substances status: Secondary | ICD-10-CM

## 2022-11-29 DIAGNOSIS — J9601 Acute respiratory failure with hypoxia: Secondary | ICD-10-CM | POA: Diagnosis present

## 2022-11-29 DIAGNOSIS — R531 Weakness: Secondary | ICD-10-CM

## 2022-11-29 DIAGNOSIS — I468 Cardiac arrest due to other underlying condition: Secondary | ICD-10-CM | POA: Diagnosis present

## 2022-11-29 DIAGNOSIS — R140 Abdominal distension (gaseous): Secondary | ICD-10-CM | POA: Diagnosis present

## 2022-11-29 DIAGNOSIS — F319 Bipolar disorder, unspecified: Secondary | ICD-10-CM | POA: Diagnosis present

## 2022-11-29 DIAGNOSIS — N179 Acute kidney failure, unspecified: Secondary | ICD-10-CM | POA: Diagnosis present

## 2022-11-29 DIAGNOSIS — J45909 Unspecified asthma, uncomplicated: Secondary | ICD-10-CM | POA: Diagnosis present

## 2022-11-29 DIAGNOSIS — T43651A Poisoning by methamphetamines accidental (unintentional), initial encounter: Principal | ICD-10-CM | POA: Diagnosis present

## 2022-11-29 DIAGNOSIS — R579 Shock, unspecified: Secondary | ICD-10-CM | POA: Diagnosis present

## 2022-11-29 DIAGNOSIS — J9692 Respiratory failure, unspecified with hypercapnia: Secondary | ICD-10-CM | POA: Diagnosis present

## 2022-11-29 DIAGNOSIS — I469 Cardiac arrest, cause unspecified: Principal | ICD-10-CM

## 2022-11-29 DIAGNOSIS — Z885 Allergy status to narcotic agent status: Secondary | ICD-10-CM

## 2022-11-29 DIAGNOSIS — F209 Schizophrenia, unspecified: Secondary | ICD-10-CM | POA: Diagnosis present

## 2022-11-29 DIAGNOSIS — E872 Acidosis, unspecified: Secondary | ICD-10-CM | POA: Diagnosis present

## 2022-11-29 LAB — CBC WITH DIFFERENTIAL/PLATELET
Abs Immature Granulocytes: 0.6 10*3/uL — ABNORMAL HIGH (ref 0.00–0.07)
Basophils Absolute: 0 10*3/uL (ref 0.0–0.1)
Basophils Relative: 0 %
Eosinophils Absolute: 0 10*3/uL (ref 0.0–0.5)
Eosinophils Relative: 0 %
HCT: 45.8 % (ref 39.0–52.0)
Hemoglobin: 14.3 g/dL (ref 13.0–17.0)
Lymphocytes Relative: 10 %
Lymphs Abs: 2.9 10*3/uL (ref 0.7–4.0)
MCH: 32.3 pg (ref 26.0–34.0)
MCHC: 31.2 g/dL (ref 30.0–36.0)
MCV: 103.4 fL — ABNORMAL HIGH (ref 80.0–100.0)
Metamyelocytes Relative: 2 %
Monocytes Absolute: 3.8 10*3/uL — ABNORMAL HIGH (ref 0.1–1.0)
Monocytes Relative: 13 %
Neutro Abs: 22.1 10*3/uL — ABNORMAL HIGH (ref 1.7–7.7)
Neutrophils Relative %: 75 %
Platelets: 184 10*3/uL (ref 150–400)
RBC: 4.43 MIL/uL (ref 4.22–5.81)
RDW: 13.4 % (ref 11.5–15.5)
WBC: 29.4 10*3/uL — ABNORMAL HIGH (ref 4.0–10.5)
nRBC: 0.2 % (ref 0.0–0.2)
nRBC: 1 /100 WBC — ABNORMAL HIGH

## 2022-11-29 LAB — COMPREHENSIVE METABOLIC PANEL
ALT: 244 U/L — ABNORMAL HIGH (ref 0–44)
AST: 428 U/L — ABNORMAL HIGH (ref 15–41)
Albumin: 4.1 g/dL (ref 3.5–5.0)
Alkaline Phosphatase: 88 U/L (ref 38–126)
Anion gap: 26 — ABNORMAL HIGH (ref 5–15)
BUN: 61 mg/dL — ABNORMAL HIGH (ref 6–20)
CO2: 10 mmol/L — ABNORMAL LOW (ref 22–32)
Calcium: 10.5 mg/dL — ABNORMAL HIGH (ref 8.9–10.3)
Chloride: 112 mmol/L — ABNORMAL HIGH (ref 98–111)
Creatinine, Ser: 6.29 mg/dL — ABNORMAL HIGH (ref 0.61–1.24)
GFR, Estimated: 11 mL/min — ABNORMAL LOW (ref >60)
Glucose, Bld: 163 mg/dL — ABNORMAL HIGH (ref 70–99)
Potassium: 5 mmol/L (ref 3.5–5.1)
Sodium: 148 mmol/L — ABNORMAL HIGH (ref 135–145)
Total Bilirubin: 0.6 mg/dL (ref 0.3–1.2)
Total Protein: 6.2 g/dL — ABNORMAL LOW (ref 6.5–8.1)

## 2022-11-29 LAB — I-STAT ARTERIAL BLOOD GAS, ED
Acid-base deficit: 20 mmol/L — ABNORMAL HIGH (ref 0.0–2.0)
Bicarbonate: 15.9 mmol/L — ABNORMAL LOW (ref 20.0–28.0)
Calcium, Ion: 1.17 mmol/L (ref 1.15–1.40)
HCT: 48 % (ref 39.0–52.0)
Hemoglobin: 16.3 g/dL (ref 13.0–17.0)
O2 Saturation: 99 %
Patient temperature: 98.1
Potassium: 7.4 mmol/L (ref 3.5–5.1)
Sodium: 139 mmol/L (ref 135–145)
TCO2: 19 mmol/L — ABNORMAL LOW (ref 22–32)
pCO2 arterial: 87.4 mmHg (ref 32–48)
pH, Arterial: 6.867 — CL (ref 7.35–7.45)
pO2, Arterial: 250 mmHg — ABNORMAL HIGH (ref 83–108)

## 2022-11-29 LAB — URINALYSIS, ROUTINE W REFLEX MICROSCOPIC
Bilirubin Urine: NEGATIVE
Glucose, UA: 50 mg/dL — AB
Ketones, ur: NEGATIVE mg/dL
Leukocytes,Ua: NEGATIVE
Nitrite: NEGATIVE
Protein, ur: >=300 mg/dL — AB
Specific Gravity, Urine: 1.022 (ref 1.005–1.030)
pH: 5 (ref 5.0–8.0)

## 2022-11-29 LAB — RAPID URINE DRUG SCREEN, HOSP PERFORMED
Amphetamines: POSITIVE — AB
Barbiturates: NOT DETECTED
Benzodiazepines: NOT DETECTED
Cocaine: NOT DETECTED
Opiates: NOT DETECTED
Tetrahydrocannabinol: NOT DETECTED

## 2022-11-29 LAB — LACTIC ACID, PLASMA: Lactic Acid, Venous: >9 mmol/L (ref 0.5–1.9)

## 2022-11-29 LAB — TROPONIN I (HIGH SENSITIVITY): Troponin I (High Sensitivity): 317 ng/L (ref <18)

## 2022-11-29 LAB — CBG MONITORING, ED: Glucose-Capillary: 92 mg/dL (ref 70–99)

## 2022-11-29 LAB — APTT: aPTT: 93 seconds — ABNORMAL HIGH (ref 24–36)

## 2022-11-29 LAB — PROTIME-INR
INR: 1.8 — ABNORMAL HIGH (ref 0.8–1.2)
Prothrombin Time: 20.5 seconds — ABNORMAL HIGH (ref 11.4–15.2)

## 2022-11-29 MED ORDER — INSULIN ASPART 100 UNIT/ML IV SOLN
5.0000 [IU] | Freq: Once | INTRAVENOUS | Status: AC
  Administered 2022-11-29: 5 [IU] via INTRAVENOUS

## 2022-11-29 MED ORDER — DOCUSATE SODIUM 50 MG/5ML PO LIQD: 100.0000 mg | Freq: Two times a day (BID) | ORAL | Status: DC | PRN

## 2022-11-29 MED ORDER — PIPERACILLIN-TAZOBACTAM 3.375 G IVPB 30 MIN
3.3750 g | Freq: Once | INTRAVENOUS | Status: AC
  Administered 2022-11-29: 3.375 g via INTRAVENOUS
  Filled 2022-11-29: qty 50

## 2022-11-29 MED ORDER — VASOPRESSIN 20 UNITS/100 ML INFUSION FOR SHOCK
INTRAVENOUS | Status: AC
  Administered 2022-11-29: 0.03 [IU]/min via INTRAVENOUS
  Filled 2022-11-29: qty 100

## 2022-11-29 MED ORDER — PIPERACILLIN-TAZOBACTAM IN DEX 2-0.25 GM/50ML IV SOLN: 2.2500 g | Freq: Three times a day (TID) | INTRAVENOUS | Status: DC

## 2022-11-29 MED ORDER — CALCIUM GLUCONATE 10 % IV SOLN
1.0000 g | Freq: Once | INTRAVENOUS | Status: AC
  Administered 2022-11-29: 1 g via INTRAVENOUS
  Filled 2022-11-29: qty 10

## 2022-11-29 MED ORDER — VANCOMYCIN HCL 2000 MG/400ML IV SOLN
2000.0000 mg | Freq: Once | INTRAVENOUS | Status: AC
  Administered 2022-11-29: 2000 mg via INTRAVENOUS
  Filled 2022-11-29: qty 400

## 2022-11-29 MED ORDER — ONDANSETRON HCL 4 MG/2ML IJ SOLN: 4.0000 mg | Freq: Four times a day (QID) | INTRAMUSCULAR | Status: DC | PRN

## 2022-11-29 MED ORDER — VANCOMYCIN VARIABLE DOSE PER UNSTABLE RENAL FUNCTION (PHARMACIST DOSING): Status: DC

## 2022-11-29 MED ORDER — PANTOPRAZOLE SODIUM 40 MG IV SOLR
40.0000 mg | Freq: Two times a day (BID) | INTRAVENOUS | Status: DC
  Administered 2022-11-29: 40 mg via INTRAVENOUS
  Filled 2022-11-29: qty 10

## 2022-11-29 MED ORDER — EPINEPHRINE 1 MG/10ML IJ SOSY
PREFILLED_SYRINGE | INTRAMUSCULAR | Status: DC | PRN
  Administered 2022-11-29 (×2): 1 mg via INTRAVENOUS

## 2022-11-29 MED ORDER — LACTATED RINGERS IV BOLUS
1000.0000 mL | Freq: Once | INTRAVENOUS | Status: AC
  Administered 2022-11-29: 1000 mL via INTRAVENOUS

## 2022-11-29 MED ORDER — FENTANYL CITRATE PF 50 MCG/ML IJ SOSY: 50.0000 ug | PREFILLED_SYRINGE | INTRAMUSCULAR | Status: DC | PRN

## 2022-11-29 MED ORDER — NOREPINEPHRINE 4 MG/250ML-% IV SOLN
0.0000 ug/min | INTRAVENOUS | Status: DC
  Administered 2022-11-29: 120 ug/min via INTRAVENOUS
  Administered 2022-11-29: 20 ug/min via INTRAVENOUS
  Administered 2022-11-29 (×4): 120 ug/min via INTRAVENOUS
  Filled 2022-11-29 (×3): qty 500
  Filled 2022-11-29: qty 250

## 2022-11-29 MED ORDER — POLYETHYLENE GLYCOL 3350 17 G PO PACK: 17.0000 g | PACK | Freq: Every day | ORAL | Status: DC | PRN

## 2022-11-29 MED ORDER — PANTOPRAZOLE SODIUM 40 MG IV SOLR: 40.0000 mg | Freq: Every day | INTRAVENOUS | Status: DC

## 2022-11-29 MED ORDER — SODIUM BICARBONATE 8.4 % IV SOLN
INTRAVENOUS | Status: AC
  Administered 2022-11-29: 250 mL
  Filled 2022-11-29: qty 200

## 2022-11-29 MED ORDER — HYDROCORTISONE SOD SUC (PF) 100 MG IJ SOLR
100.0000 mg | Freq: Two times a day (BID) | INTRAMUSCULAR | Status: DC
  Administered 2022-11-29: 100 mg via INTRAVENOUS
  Filled 2022-11-29: qty 2

## 2022-11-29 MED ORDER — DEXTROSE 10 % IV SOLN: Freq: Once | INTRAVENOUS | Status: AC

## 2022-11-29 MED ORDER — SODIUM BICARBONATE 8.4 % IV SOLN
INTRAVENOUS | Status: DC | PRN
  Administered 2022-11-29 (×2): 50 meq via INTRAVENOUS
  Administered 2022-11-29: 100 meq via INTRAVENOUS

## 2022-11-29 MED ORDER — EPINEPHRINE HCL 5 MG/250ML IV SOLN IN NS
0.5000 ug/min | INTRAVENOUS | Status: DC
  Administered 2022-11-29: 10 ug/min via INTRAVENOUS
  Administered 2022-11-29: 30 ug/min via INTRAVENOUS
  Filled 2022-11-29: qty 250

## 2022-11-29 MED ORDER — ALBUTEROL SULFATE (2.5 MG/3ML) 0.083% IN NEBU
10.0000 mg | INHALATION_SOLUTION | Freq: Once | RESPIRATORY_TRACT | Status: AC
  Administered 2022-11-29: 10 mg via RESPIRATORY_TRACT
  Filled 2022-11-29: qty 12

## 2022-11-29 MED ORDER — VASOPRESSIN 20 UNITS/100 ML INFUSION FOR SHOCK: 0.0000 [IU]/min | INTRAVENOUS | Status: DC

## 2022-11-29 MED ORDER — SODIUM BICARBONATE 8.4 % IV SOLN
INTRAVENOUS | Status: AC
  Administered 2022-11-29: 150 mL
  Filled 2022-11-29: qty 150

## 2022-11-29 MED ORDER — DEXTROSE 50 % IV SOLN
1.0000 | Freq: Once | INTRAVENOUS | Status: AC
  Administered 2022-11-29: 50 mL via INTRAVENOUS
  Filled 2022-11-29: qty 50

## 2022-11-29 MED ORDER — SODIUM BICARBONATE 8.4 % IV SOLN
INTRAVENOUS | Status: DC
  Filled 2022-11-29: qty 1000

## 2022-11-29 MED ORDER — MIDAZOLAM HCL 2 MG/2ML IJ SOLN: 1.0000 mg | INTRAMUSCULAR | Status: DC | PRN

## 2022-11-30 LAB — BLOOD CULTURE ID PANEL (REFLEXED) - BCID2

## 2022-11-30 LAB — URINE CULTURE: Culture: NO GROWTH

## 2022-11-30 LAB — PATHOLOGIST SMEAR REVIEW

## 2022-12-02 LAB — CULTURE, BLOOD (ROUTINE X 2)

## 2022-12-08 NOTE — Progress Notes (Signed)
Pharmacy Antibiotic Note  Lance Abbott is a 42 y.o. male admitted on 12-28-2022 with sepsis. Patient admitted to ED post asystole arrest now on 3 pressors. CCM following. Pharmacy has been consulted for vancomycin and zosyn dosing.  Patient with AKI Scr 6.29 (BL ~1). Wbc 29.4, LA > 9. Patient previously received loading dose of vancomycin 2g IV x1 and zosyn 3.375g IV x1.   Plan: Start zosyn 2.25 g IV q8h Dose vancomycin per unstable renal function given acute AKI  Obtain vancomycin levels as appropriate  Monitor renal function, culture results, clinical status and resolution of s/sx infection Narrow abx as able and f/u with appropriate duration   Height: 6' (182.9 cm) IBW/kg (Calculated) : 77.6  Temp (24hrs), Avg:97.3 F (36.3 C), Min:93.5 F (34.2 C), Max:98.8 F (37.1 C)  Recent Labs  Lab 2022-12-28 1637  WBC 29.4*  CREATININE 6.29*  LATICACIDVEN >9.0*    Estimated Creatinine Clearance: 18.8 mL/min (A) (by C-G formula based on SCr of 6.29 mg/dL (H)).    Allergies  Allergen Reactions   Cogentin [Benztropine] Shortness Of Breath and Swelling    Throat swelling, airway restriction   Haldol [Haloperidol] Anaphylaxis and Swelling    Throat swelling, airway restriction Body pains   Zyprexa [Olanzapine] Shortness Of Breath and Swelling    Throat swelling, airway restriction   Celexa [Citalopram Hydrobromide] Other (See Comments)    Psychotic episodes Triggers PTSD   Depakote [Valproic Acid] Other (See Comments)    Altered mental status   Effexor [Venlafaxine] Other (See Comments)    Hallucinations Increased depression   Hydrochlorothiazide Other (See Comments)    Avoids due to kidney failure   Lasix [Furosemide] Other (See Comments)    Avoids due to kidney failure   Lexapro [Escitalopram] Other (See Comments)    Altered mental status Causes manic reactions   Lithobid [Lithium] Other (See Comments)    Altered mental status   Thorazine [Chlorpromazine] Other (See  Comments)    Avoids due to kidney failure   Topamax [Topiramate] Other (See Comments)    Hair Loss   Ultram [Tramadol] Hives   Vivitrol [Naltrexone] Other (See Comments)    Increased cravings   Zestril [Lisinopril] Other (See Comments)    Avoids due to kidney failure    Antimicrobials this admission: Vancomycin 28-Dec-2022 >>  Zosyn 12/28/2022 >>   Dose adjustments this admission: N/a   Microbiology results: 28-Dec-2022 BCx: sent 12/28/2022 UCx: sent   12/28/2022 Resp panel: sent    Thank you for allowing pharmacy to be a part of this patient's care.  Gena Fray, PharmD PGY1 Pharmacy Resident   December 28, 2022 9:19 PM

## 2022-12-08 NOTE — ED Notes (Signed)
Patient placement notified of death.

## 2022-12-08 NOTE — Telephone Encounter (Signed)
Attempted to follow up with client who has been in ER multiple times, last being 11/28/22. Last contact with care connect client was 11/14/22 at [facility] and reminded him that he needed to contact [facility] to reschedule his appointment to re-establish primary care. Client has no future appointments noted in EPIC.  Attempted call this am and number listed states he is unable to take calls at this time.   [place] [employer]

## 2022-12-08 NOTE — Death Summary Note (Signed)
DEATH SUMMARY   Patient Details  Name: Lance Abbott MRN: [id number] DOB: 1981/08/12 WVP:[id number], No Pcp Per  Admission/Discharge Information   Admit Date:  2022-12-21  Date of Death: Date of Death: Dec 21, 2022  Time of Death: Time of Death: 2137/03/02  Length of Stay: 0   Principle Cause of death: shock   Hospital Diagnoses: Principal Problem:   Weakness acquired in ICU Active Problems:   Cardiac arrest [facility])   Accidental amphetamine overdose ([place])   Shock ([place])   Metabolic acidosis Lactic acidosis  Hypoxemic hypercarbic respiratory failure   Hospital Course: Mr. Lance Abbott is a 42 yo man with hx of schizophrenia, bipolar 1 disorder (both listed in chart) polysubstance abuse, anxiety, asthma, who presented to [facility] 2022/12/21 post arrest. Reportedly pt has been using amphetamines recreationally-- specifically 300mg  adderall on 1/22 and developed chest pain yesterday prompting ED presentation at [facility]. Poison control was contacted. Pt Left AMA with IV in place.   EMS was dispatched 12/21/2022 for pt "not acting right." He was acutely altered and was agitated and aggressive. Was attempting to harm other people and then spontaneously collapsed and had an episode of emesis and was found to be pulseless and apneic. CPR x 19 min with ROSC, and taken to ED for further care. Upon arrival to the ED he was profoundly hypotensive and placed immediately on vasopressors.  Intubated for airway protection. Despite multiple high dose vasopressors profound shock persisted.   PCCM consulted for admission. BP continued to fall despite levophed up to 182mcg/min and epi at 39mcg/min.  Antibiotics, stress dose steroids, bicarbonate (pushes and infusion) given for severe acidosis. RR increased to 34 for compensation for severe respiratory acidosis.   Pupils were fixed and dilated, agonal gasps, abd progressively more distended and tense, absolutely no bowel sounds.    Focused echo: Poor contractility given large doses inotropes.   No significant effusion visible.   Per discussion with ED; early bedside echo when windows were better (abd now distended) - no effusion.  Suspected cardiac ischemia, intraabdominal ischemia and possible perforation given severely increased distention.  Repeat ABG showed severe hypercarbia and acidosis.   Family not available for code status conversation.  Cardiac arrest developed again when on maximal therapy.  Completed 4 rounds of cpr with no ROSC.    Assessment and Plan: No notes have been filed under this hospital service. Service: Hospitalist    Procedures: central line, arterial line, intubation.   Consultations: PCCM  The results of significant diagnostics from this hospitalization (including imaging, microbiology, ancillary and laboratory) are listed below for reference.   Significant Diagnostic Studies: DG CHEST PORT 1 VIEW  Result Date: 2022-12-21 CLINICAL DATA:  Hypoxia, status post CPR, asystole. EXAM: PORTABLE CHEST 1 VIEW COMPARISON:  12-21-22 4:35 p.m. FINDINGS: Endotracheal tube tip is 5.0 cm above the carina. Nasogastric tube terminates in the vicinity of the stomach antrum. Substantially increased perihilar airspace opacities favoring acute pulmonary edema or less likely pulmonary contusions. Low lung volumes. No blunting of the costophrenic angles. Heart size remains within normal limits. No visible pneumothorax or pneumomediastinum. IMPRESSION: 1. Substantially increased perihilar airspace opacities favoring acute pulmonary edema or less likely pulmonary contusions. 2. Endotracheal tube tip is 5.0 cm above the carina. Nasogastric tube terminates in the vicinity of the stomach antrum. Electronically Signed   By: Van Clines M.D.   On: 12-21-2022 21:00   DG Chest Portable 1 View  Result Date: 12/21/22 CLINICAL DATA:  Endotracheal tube placement, status post cardiopulmonary resuscitation. EXAM: PORTABLE  CHEST 1 VIEW COMPARISON:  01/28/2022 FINDINGS: Endotracheal  tube tip 4.4 cm above the carina. Low lung volumes are present, causing crowding of the pulmonary vasculature. Indistinct pulmonary vasculature, cannot exclude pulmonary venous hypertension. Heart size within normal limits for technique. No blunting of the costophrenic angles. No pneumothorax. IMPRESSION: 1. Endotracheal tube tip: 4.4 cm above the carina (satisfactorily positioned). 2. Low lung volumes are present, causing crowding of the pulmonary vasculature. 3. Indistinct pulmonary vasculature, cannot exclude pulmonary venous hypertension. Electronically Signed   By: Van Clines M.D.   On: 25-Dec-2022 16:56   DG Abdomen 1 View  Result Date: Dec 25, 2022 CLINICAL DATA:  Nasogastric tube placement EXAM: ABDOMEN - 1 VIEW COMPARISON:  Overlapping portion of lumbar radiographs from 12-25-2022 FINDINGS: Nasogastric tube noted with side port in the stomach body and distal tip in the stomach antrum. The NG tube appears satisfactorily position. Prominence of gas and stool in the colon. Suspected mild gaseous prominence of central small bowel loops. IMPRESSION: 1. Nasogastric tube tip is in the stomach antrum. 2. Prominence of gas and stool in the colon. Mildly prominent loops of gas-filled small bowel in the upper abdomen. Electronically Signed   By: Van Clines M.D.   On: 2022-12-25 16:55    Microbiology: No results found for this or any previous visit (from the past 240 hour(s)).  Time spent: 60 minutes  Signed: Collier Bullock, MD Dec 25, 2022
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
## 2022-12-08 NOTE — ED Provider Notes (Signed)
[place] EMERGENCY DEPARTMENT AT [facility] Provider Note   CSN: [id number] Arrival date & time: 12/18/22  1614     History  Chief Complaint  Patient presents with   Post Arrest    Lance Abbott is a 42 y.o. male.  42 yo M with a chief complaints of cardiac arrest.  The patient had an EMS call out to his house due to increased agitation.  Has a known history of schizophrenia as well is drug abuse.  He was acutely altered and was agitated and aggressive.  Was attempting to harm other people and then spontaneously collapsed and had an episode of emesis and was found to be pulseless and apneic.  3 rounds of CPR were performed, 3 doses of epinephrine.  Reported rhythm was asystole.  Now back in normal sinus rhythm.  Requiring epinephrine for support.  Brought here for evaluation.  Level 5 caveat        Home Medications Prior to Admission medications   Medication Sig Start Date End Date Taking? Authorizing Provider  ARIPiprazole (ABILIFY) 15 MG tablet Take 1 tablet (15 mg total) by mouth daily for 14 days. 02/09/22 12/17/22  Massengill, Harrold Donath, MD  ARIPiprazole ER (ABILIFY MAINTENA) 400 MG SRER injection Inject 2 mLs (400 mg total) into the muscle every 28 (twenty-eight) days. 03/07/22   Massengill, Harrold Donath, MD  buPROPion (WELLBUTRIN XL) 300 MG 24 hr tablet Take 300 mg by mouth daily. 09/06/22   [provider]  busPIRone (BUSPAR) 15 MG tablet Take 15 mg by mouth 3 (three) times daily. 09/06/22   [provider]  cyclobenzaprine (FLEXERIL) 10 MG tablet Take 1 tablet (10 mg total) by mouth 2 (two) times daily as needed for muscle spasms. Patient not taking: Reported on 10/16/2022 10/11/22   Fayrene Helper, PA-C  DM-Phenylephrine-Acetaminophen [facility] DAYQUIL COLD & FLU PO) Take 1 capsule by mouth daily as needed (sinus congestion, runny nose).    [provider]  ibuprofen (ADVIL) 600 MG tablet Take 1 tablet (600 mg total) by mouth every 6 (six) hours as  needed. Patient taking differently: Take 600 mg by mouth every 6 (six) hours as needed for mild pain or moderate pain. 10/11/22   Fayrene Helper, PA-C  naproxen (NAPROSYN) 500 MG tablet Take 1 tablet (500 mg total) by mouth 2 (two) times daily. Patient not taking: Reported on 10/16/2022 07/11/22   Cecil Cobbs, PA-C  NEOMYCIN-POLYMYXIN-HYDROCORTISONE (CORTISPORIN) 1 % SOLN OTIC solution Place 1-5 drops into both ears 2 (two) times daily as needed (recurrent ear infections).    [provider]  nicotine (NICODERM CQ - DOSED IN MG/24 HOURS) 21 mg/24hr patch Place 1 patch (21 mg total) onto the skin daily. 10/11/22   Fayrene Helper, PA-C  propranolol (INDERAL) 10 MG tablet Take 10 mg by mouth 3 (three) times daily. 09/06/22   [provider]  QUEtiapine (SEROQUEL) 400 MG tablet Take 1 tablet (400 mg total) by mouth at bedtime. 02/08/22 12/17/22  Massengill, Harrold Donath, MD  tiZANidine (ZANAFLEX) 4 MG capsule Take 1 capsule (4 mg total) by mouth 3 (three) times daily. 10/27/22   Jacalyn Lefevre, MD  traZODone (DESYREL) 100 MG tablet Take 100 mg by mouth at bedtime as needed (insomnia not relieved by quetiapine).    [provider]      Allergies    Cogentin [benztropine], Haldol [haloperidol], Zyprexa [olanzapine], Celexa [citalopram hydrobromide], Depakote [valproic acid], Effexor [venlafaxine], Hydrochlorothiazide, Lasix [furosemide], Lexapro [escitalopram], Lithobid [lithium], Thorazine [chlorpromazine], Topamax [topiramate], Ultram [tramadol],  Vivitrol [naltrexone], and Zestril [lisinopril]    Review of Systems   Review of Systems  Physical Exam Updated Vital Signs BP (!) 69/47   Pulse (!) 27   Temp 98.6 F (37 C)   Resp (!) 0   Ht 6' (1.829 m)   SpO2 90%   BMI 29.30 kg/m  Physical Exam Vitals and nursing note reviewed.  Constitutional:      Appearance: He is well-developed. He is ill-appearing.  HENT:     Head: Normocephalic and atraumatic.  Eyes:     Comments:  Fixed and dilated pupils  Neck:     Vascular: No JVD.  Cardiovascular:     Rate and Rhythm: Normal rate and regular rhythm.     Heart sounds: No murmur heard.    No friction rub. No gallop.  Pulmonary:     Effort: No respiratory distress.     Breath sounds: No wheezing.  Abdominal:     General: There is distension.     Tenderness: There is no abdominal tenderness. There is no guarding or rebound.  Musculoskeletal:        General: Normal range of motion.     Cervical back: Normal range of motion and neck supple.  Skin:    Coloration: Skin is not pale.     Findings: No rash.  Neurological:     GCS: GCS eye subscore is 1. GCS verbal subscore is 1. GCS motor subscore is 1.     ED Results / Procedures / Treatments   Labs (all labs ordered are listed, but only abnormal results are displayed) Labs Reviewed  LACTIC ACID, PLASMA - Abnormal; Notable for the following components:      Result Value   Lactic Acid, Venous >9.0 (*)    All other components within normal limits  COMPREHENSIVE METABOLIC PANEL - Abnormal; Notable for the following components:   Sodium 148 (*)    Chloride 112 (*)    CO2 10 (*)    Glucose, Bld 163 (*)    BUN 61 (*)    Creatinine, Ser 6.29 (*)    Calcium 10.5 (*)    Total Protein 6.2 (*)    AST 428 (*)    ALT 244 (*)    GFR, Estimated 11 (*)    Anion gap 26 (*)    All other components within normal limits  CBC WITH DIFFERENTIAL/PLATELET - Abnormal; Notable for the following components:   WBC 29.4 (*)    MCV 103.4 (*)    Neutro Abs 22.1 (*)    Monocytes Absolute 3.8 (*)    nRBC 1 (*)    Abs Immature Granulocytes 0.60 (*)    All other components within normal limits  PROTIME-INR - Abnormal; Notable for the following components:   Prothrombin Time 20.5 (*)    INR 1.8 (*)    All other components within normal limits  APTT - Abnormal; Notable for the following components:   aPTT 93 (*)    All other components within normal limits  URINALYSIS,  ROUTINE W REFLEX MICROSCOPIC - Abnormal; Notable for the following components:   Color, Urine AMBER (*)    APPearance CLOUDY (*)    Glucose, UA 50 (*)    Hgb urine dipstick LARGE (*)    Protein, ur >=300 (*)    Bacteria, UA FEW (*)    All other components within normal limits  RAPID URINE DRUG SCREEN, HOSP PERFORMED - Abnormal; Notable for the following components:   Amphetamines  POSITIVE (*)    All other components within normal limits  I-STAT ARTERIAL BLOOD GAS, ED - Abnormal; Notable for the following components:   pH, Arterial 6.867 (*)    pCO2 arterial 87.4 (*)    pO2, Arterial 250 (*)    Bicarbonate 15.9 (*)    TCO2 19 (*)    Acid-base deficit 20.0 (*)    Potassium 7.4 (*)    All other components within normal limits  TROPONIN I (HIGH SENSITIVITY) - Abnormal; Notable for the following components:   Troponin I (High Sensitivity) 317 (*)    All other components within normal limits  CULTURE, BLOOD (ROUTINE X 2)  URINE CULTURE  RESP PANEL BY RT-PCR (RSV, FLU A&B, COVID)  RVPGX2  LACTIC ACID, PLASMA  BLOOD GAS, ARTERIAL  PATHOLOGIST SMEAR REVIEW  CK  FIBRINOGEN  LIPASE, BLOOD  BLOOD GAS, ARTERIAL  BASIC METABOLIC PANEL  CBC  CBG MONITORING, ED  TROPONIN I (HIGH SENSITIVITY)    EKG EKG Interpretation  Date/Time:  December 10, 2022 16:20:20 EST Ventricular Rate:  94 PR Interval:  141 QRS Duration: 93 QT Interval:  371 QTC Calculation: 464 R Axis:   58 Text Interpretation: Sinus rhythm Consider RVH or posterior infarct Nonspecific repol abnormality, lateral leads st depression inferior and lateral leads Confirmed by Deno Etienne [phone]) on 12-10-22 4:37:36 PM  Radiology DG CHEST PORT 1 VIEW  Result Date: 12-10-22 CLINICAL DATA:  Hypoxia, status post CPR, asystole. EXAM: PORTABLE CHEST 1 VIEW COMPARISON:  2022/12/10 4:35 p.m. FINDINGS: Endotracheal tube tip is 5.0 cm above the carina. Nasogastric tube terminates in the vicinity of the stomach antrum.  Substantially increased perihilar airspace opacities favoring acute pulmonary edema or less likely pulmonary contusions. Low lung volumes. No blunting of the costophrenic angles. Heart size remains within normal limits. No visible pneumothorax or pneumomediastinum. IMPRESSION: 1. Substantially increased perihilar airspace opacities favoring acute pulmonary edema or less likely pulmonary contusions. 2. Endotracheal tube tip is 5.0 cm above the carina. Nasogastric tube terminates in the vicinity of the stomach antrum. Electronically Signed   By: Van Clines M.D.   On: 2022-12-10 21:00   DG Chest Portable 1 View  Result Date: 12-10-22 CLINICAL DATA:  Endotracheal tube placement, status post cardiopulmonary resuscitation. EXAM: PORTABLE CHEST 1 VIEW COMPARISON:  01/28/2022 FINDINGS: Endotracheal tube tip 4.4 cm above the carina. Low lung volumes are present, causing crowding of the pulmonary vasculature. Indistinct pulmonary vasculature, cannot exclude pulmonary venous hypertension. Heart size within normal limits for technique. No blunting of the costophrenic angles. No pneumothorax. IMPRESSION: 1. Endotracheal tube tip: 4.4 cm above the carina (satisfactorily positioned). 2. Low lung volumes are present, causing crowding of the pulmonary vasculature. 3. Indistinct pulmonary vasculature, cannot exclude pulmonary venous hypertension. Electronically Signed   By: Van Clines M.D.   On: 12/10/22 16:56   DG Abdomen 1 View  Result Date: 12-10-22 CLINICAL DATA:  Nasogastric tube placement EXAM: ABDOMEN - 1 VIEW COMPARISON:  Overlapping portion of lumbar radiographs from 12-10-22 FINDINGS: Nasogastric tube noted with side port in the stomach body and distal tip in the stomach antrum. The NG tube appears satisfactorily position. Prominence of gas and stool in the colon. Suspected mild gaseous prominence of central small bowel loops. IMPRESSION: 1. Nasogastric tube tip is in the stomach antrum. 2.  Prominence of gas and stool in the colon. Mildly prominent loops of gas-filled small bowel in the upper abdomen. Electronically Signed   By: Van Clines M.D.   On: 10-Dec-2022  16:55    Procedures NG placement  Date/Time: Dec 06, 2022 5:07 PM  Performed by: Deno Etienne, DO Authorized by: Deno Etienne, DO  Consent: The procedure was performed in an emergent situation. Time out: Immediately prior to procedure a "time out" was called to verify the correct patient, procedure, equipment, support staff and site/side marked as required. Local anesthesia used: no  Anesthesia: Local anesthesia used: no  Sedation: Patient sedated: no  Patient tolerance: patient tolerated the procedure well with no immediate complications   Ultrasound ED Echo  Date/Time: December 06, 2022 5:07 PM  Performed by: Deno Etienne, DO Authorized by: Deno Etienne, DO   Procedure details:    Indications: cardiac arrest     Views: subxiphoid, parasternal short axis view and IVC view     Images: archived     Limitations:  Acoustic shadowing Findings:    Pericardium: no pericardial effusion     LV Function: normal (>50% EF)     RV Diameter: normal     IVC: dilated   Impression:    Impression: normal and probable elevated CVP   ARTERIAL LINE  Date/Time: 06-Dec-2022 5:49 PM  Performed by: Deno Etienne, DO Authorized by: Deno Etienne, DO   Consent:    Consent obtained:  Emergent situation Universal protocol:    Patient identity confirmed:  Arm band Indications:    Indications: hemodynamic monitoring and multiple ABGs   Pre-procedure details:    Skin preparation:  Chlorhexidine   Preparation: Patient was prepped and draped in sterile fashion   Sedation:    Sedation type:  None Anesthesia:    Anesthesia method:  None Procedure details:    Location:  R radial   Allen's test performed: yes     Allen's test abnormal: no     Needle gauge:  18 G   Placement technique:  Ultrasound guided   Number of attempts:  1    Transducer: waveform confirmed   Post-procedure details:    Post-procedure:  Biopatch applied, secured with tape and sterile dressing applied   CMS:  Unchanged   Procedure completion:  Tolerated well, no immediate complications .Critical Care  Performed by: Deno Etienne, DO Authorized by: Deno Etienne, DO   Critical care provider statement:    Critical care time (minutes):  80   Critical care time was exclusive of:  Separately billable procedures and treating other patients   Critical care was time spent personally by me on the following activities:  Development of treatment plan with patient or surrogate, discussions with consultants, evaluation of patient's response to treatment, examination of patient, ordering and review of laboratory studies, ordering and review of radiographic studies, ordering and performing treatments and interventions, pulse oximetry, re-evaluation of patient's condition and review of old charts   Care discussed with: admitting provider     Cardiopulmonary Resuscitation (CPR) Procedure Note Directed/Performed by: Cecilio Asper I personally directed ancillary staff and/or performed CPR in an effort to regain return of spontaneous circulation and to maintain cardiac, neuro and systemic perfusion.   Procedure note: Ultrasound Guided Peripheral IV Ultrasound guided peripheral 1.88 inch angiocath IV placement performed by me. Indications: Nursing unable to place IV. Details: The antecubital fossa and upper arm were evaluated with a multifrequency linear probe. Patent brachial veins were noted. 1 attempt was made to cannulate a vein under realtime US guidance with successful cannulation of the vein and catheter placement. There is return of non-pulsatile dark red blood. The patient tolerated the procedure well without complications. Images archived electronically.  CPT codes: [phone] and [phone]    Medications Ordered in ED Medications  EPINEPHrine (ADRENALIN) 5 mg in  NS 250 mL (0.02 mg/mL) premix infusion (30 mcg/min Intravenous New Bag/Given 12-06-22 1945)  norepinephrine (LEVOPHED) 4mg  in 255mL (0.016 mg/mL) premix infusion (120 mcg/min Intravenous New Bag/Given 12-06-22 2105)  fentaNYL (SUBLIMAZE) injection 50 mcg (has no administration in time range)  fentaNYL (SUBLIMAZE) injection 50-200 mcg (has no administration in time range)  midazolam (VERSED) injection 1-2 mg (has no administration in time range)  vancomycin (VANCOREADY) IVPB 2000 mg/400 mL (2,000 mg Intravenous New Bag/Given December 06, 2022 2001)  sodium bicarbonate 150 mEq in dextrose 5 % 1,150 mL infusion ( Intravenous New Bag/Given 12-06-22 1949)  vasopressin (PITRESSIN) 20 Units in sodium chloride 0.9 % 100 mL infusion-*FOR SHOCK* (0.03 Units/min Intravenous New Bag/Given 12/06/22 1957)  sodium bicarbonate injection (50 mEq Intravenous Given 12/06/2022 2131)  docusate (COLACE) 50 MG/5ML liquid 100 mg (has no administration in time range)  polyethylene glycol (MIRALAX / GLYCOLAX) packet 17 g (has no administration in time range)  ondansetron (ZOFRAN) injection 4 mg (has no administration in time range)  hydrocortisone sodium succinate (SOLU-CORTEF) 100 MG injection 100 mg (100 mg Intravenous Given 06-Dec-2022 2023)  piperacillin-tazobactam (ZOSYN) IVPB 2.25 g (has no administration in time range)  vancomycin variable dose per unstable renal function (pharmacist dosing) (has no administration in time range)  pantoprazole (PROTONIX) injection 40 mg (40 mg Intravenous Given 2022-12-06 2110)  EPINEPHrine (ADRENALIN) 1 MG/10ML injection (1 mg Intravenous Given 12/06/2022 2134)  piperacillin-tazobactam (ZOSYN) IVPB 3.375 g (0 g Intravenous Stopped 12-06-2022 1910)  calcium gluconate inj 10% (1 g) URGENT USE ONLY! (1 g Intravenous Given 2022-12-06 1910)  albuterol (PROVENTIL) (2.5 MG/3ML) 0.083% nebulizer solution 10 mg (10 mg Nebulization Given December 06, 2022 1955)  insulin aspart (novoLOG) injection 5 Units (5 Units Intravenous Given  12/06/2022 1905)    And  dextrose 50 % solution 50 mL (50 mLs Intravenous Given 12/06/2022 1905)  dextrose 10 % infusion ( Intravenous New Bag/Given 06-Dec-2022 1915)  sodium bicarbonate 1 mEq/mL injection (150 mLs  Given 12/06/22 1932)  sodium bicarbonate 1 mEq/mL injection (250 mLs  Given 12/06/2022 1937)  lactated ringers bolus 1,000 mL (1,000 mLs Intravenous New Bag/Given 12/06/2022 2015)    ED Course/ Medical Decision Making/ A&P                             Medical Decision Making Amount and/or Complexity of Data Reviewed Labs: ordered. Radiology: ordered. ECG/medicine tests: ordered.  Risk OTC drugs. Prescription drug management. Decision regarding hospitalization.   42 yo M with a chief complaints of altered mental status.  All history is obtained by EMS.  Reportedly had called out for acute agitation.  Abruptly arrested and was in asystole.  3 rounds of CPR with 3 doses of epi with ROSC.  Intubated on scene.  GCS of 3.  Patient hypotensive on arrival with blood pressure in the 60s.  Titrated up on epi without significant improvement Levophed added with improvement.  Bedside ultrasound with good cardiac squeeze no dilation of the right ventricle.  IVC is dilated with no respiratory variation.  Will obtain lab evaluation.  Chest x-ray.  Chest x-ray with good ET tube placement.  No obvious pneumonia, no pneumothorax.  KUB with diffuse distended colon.  NG tube in good position as independently interpreted by me.  Lab work with acute renal failure, lactate greater than 9, pH is 6.8.  Critical care  consulted for admission.  Do recommend imaging if able.  Further history is obtained by family, states that he has been doing methamphetamines with the family for the past couple days.  He was not feeling well and so went to the hospital yesterday and then left prior to being seen.  Was complaining of some chest pain and was having some nausea and vomiting as well as had some abdominal distention  yesterday and today.     Difficulty obtaining imaging due to patient instability.   I was notified the patient had lost pulses.  CPR initiated.  Given bicarb, epi.  PEA, then vtach, then PEA.  Critical care at bedside, thought further intervention likely futile, code called.  The patients results and plan were reviewed and discussed.   Any x-rays performed were independently reviewed by myself.   Differential diagnosis were considered with the presenting HPI.  Medications  EPINEPHrine (ADRENALIN) 5 mg in NS 250 mL (0.02 mg/mL) premix infusion (30 mcg/min Intravenous New Bag/Given 26-Dec-2022 1945)  norepinephrine (LEVOPHED) 4mg  in 266mL (0.016 mg/mL) premix infusion (120 mcg/min Intravenous New Bag/Given 26-Dec-2022 2105)  fentaNYL (SUBLIMAZE) injection 50 mcg (has no administration in time range)  fentaNYL (SUBLIMAZE) injection 50-200 mcg (has no administration in time range)  midazolam (VERSED) injection 1-2 mg (has no administration in time range)  vancomycin (VANCOREADY) IVPB 2000 mg/400 mL (2,000 mg Intravenous New Bag/Given 2022-12-26 2001)  sodium bicarbonate 150 mEq in dextrose 5 % 1,150 mL infusion ( Intravenous New Bag/Given 2022-12-26 1949)  vasopressin (PITRESSIN) 20 Units in sodium chloride 0.9 % 100 mL infusion-*FOR SHOCK* (0.03 Units/min Intravenous New Bag/Given 2022/12/26 1957)  sodium bicarbonate injection (50 mEq Intravenous Given 26-Dec-2022 2131)  docusate (COLACE) 50 MG/5ML liquid 100 mg (has no administration in time range)  polyethylene glycol (MIRALAX / GLYCOLAX) packet 17 g (has no administration in time range)  ondansetron (ZOFRAN) injection 4 mg (has no administration in time range)  hydrocortisone sodium succinate (SOLU-CORTEF) 100 MG injection 100 mg (100 mg Intravenous Given 26-Dec-2022 2023)  piperacillin-tazobactam (ZOSYN) IVPB 2.25 g (has no administration in time range)  vancomycin variable dose per unstable renal function (pharmacist dosing) (has no administration in time  range)  pantoprazole (PROTONIX) injection 40 mg (40 mg Intravenous Given Dec 26, 2022 2110)  EPINEPHrine (ADRENALIN) 1 MG/10ML injection (1 mg Intravenous Given 12-26-22 2134)  piperacillin-tazobactam (ZOSYN) IVPB 3.375 g (0 g Intravenous Stopped December 26, 2022 1910)  calcium gluconate inj 10% (1 g) URGENT USE ONLY! (1 g Intravenous Given 2022/12/26 1910)  albuterol (PROVENTIL) (2.5 MG/3ML) 0.083% nebulizer solution 10 mg (10 mg Nebulization Given 12/26/22 1955)  insulin aspart (novoLOG) injection 5 Units (5 Units Intravenous Given 12/26/22 1905)    And  dextrose 50 % solution 50 mL (50 mLs Intravenous Given 2022/12/26 1905)  dextrose 10 % infusion ( Intravenous New Bag/Given December 26, 2022 1915)  sodium bicarbonate 1 mEq/mL injection (150 mLs  Given 12-26-22 1932)  sodium bicarbonate 1 mEq/mL injection (250 mLs  Given 12-26-2022 1937)  lactated ringers bolus 1,000 mL (1,000 mLs Intravenous New Bag/Given 12/26/22 2015)    Vitals:   2022-12-26 2100 12-26-22 2113 12/26/22 2115 12-26-22 2139  BP: (!) 67/39  (!) 69/47   Pulse:      Resp: (!) 30 (!) 30 (!) 30 (!) 0  Temp: 98.8 F (37.1 C) 98.8 F (37.1 C) 98.8 F (37.1 C) 98.6 F (37 C)  SpO2:      Height:        Final diagnoses:  Cardiac arrest ([place])  Admission/ observation were discussed with the admitting physician, patient and/or family and they are comfortable with the plan.          Final Clinical Impression(s) / ED Diagnoses Final diagnoses:  Cardiac arrest [facility])    Rx / DC Orders ED Discharge Orders     None         Deno Etienne, DO 12-20-2022 2156

## 2022-12-08 NOTE — ED Notes (Signed)
Bair Hugger placed on pt

## 2022-12-08 NOTE — Progress Notes (Signed)
I responded to a call from the nurse to provide spiritual support for the patient's family. I arrived at the patient's room where his brothers, father, and stepmother were present. The family requested time alone, so I gave them space to grieve together. I remained available if they need Chaplain support.    12-10-22 2207  Spiritual Encounters  Type of Visit Initial  Care provided to: [facility] partners present during encounter Nurse  Referral source Nurse (RN/NT/LPN)  Reason for visit Patient death  OnCall Visit Yes  Interventions  Spiritual Care Interventions Made Established relationship of care and support;Compassionate presence;Encouragement    Chaplain Dr Redgie Grayer

## 2022-12-08 NOTE — Progress Notes (Signed)
Patient is in refractory shock despite very high dose inotropy/pressors.  BP continues to fall, systolic now in 83T.   Pupils fixed and dilated.  Agonal gasps.   Abdomen - distended, tense, no bowel sounds.   Focused echo: Poor contractility given large doses inotropes.  No significant effusion visible.   Per discussion with ED; early bedside echo when windows were better (abd now distended) - no effusion.   Only his cousin is at bedside.  I informed him pt is dying despite aggressive efforts.   Attempted to call father, only phone number in chart --> message immediatey says voice mail not set up.  Cousin tells me family all left for home, no one here in hospital.   I asked him contact his father and other family so I can inform them of what is going on.    Suspect patient has cardiac ischemia, intraabdominal catastrophe (ischemia, possible perforation with increased distension).  Treating for sepsis as well. Very poor prognosis.

## 2022-12-08 NOTE — ED Triage Notes (Addendum)
Pt BIB EMS post CPR known drug user, hx of schizophrenia. Called out for "not acting right" pt found in asystole, given 3 epi now on epi drip. ETT in place. CPR initiated at 15:15, ROSC obtained at 15:34. EMS VS 98/61 HR 102 CBG 122

## 2022-12-08 NOTE — ED Notes (Signed)
Order to stop bair hugger at 97.0 per MD Tyrone Nine. Bair hugger removed at this time.

## 2022-12-08 NOTE — Progress Notes (Signed)
Called medical examiner, Arnoldo Morale, regarding patient.  She will determine if this is a ME case or not.

## 2022-12-08 NOTE — Procedures (Signed)
Central Venous Catheter Insertion Procedure Note  Kellon Chalk  [id number]  February 17, 1981  Date:12-29-2022  Time:9:11 PM   Provider Performing:Judas Mohammad Viona Gilmore [place]   Procedure: Insertion of Non-tunneled Central Venous Catheter(36556) with US guidance (40981)   Indication(s) Medication administration  Consent Unable to obtain consent due to emergent nature of procedure.  Anesthesia Topical only with 1% lidocaine   Timeout Verified patient identification, verified procedure, site/side was marked, verified correct patient position, special equipment/implants available, medications/allergies/relevant history reviewed, required imaging and test results available.  Sterile Technique Maximal sterile technique including full sterile barrier drape, hand hygiene, sterile gown, sterile gloves, mask, hair covering, sterile ultrasound probe cover (if used).  Procedure Description Area of catheter insertion was cleaned with chlorhexidine and draped in sterile fashion.  With real-time ultrasound guidance a central venous catheter was placed into the right femoral vein. Nonpulsatile blood flow and easy flushing noted in all ports.  The catheter was sutured in place and sterile dressing applied.  Complications/Tolerance None; patient tolerated the procedure well. Chest X-ray is ordered to verify placement for internal jugular or subclavian cannulation.   Chest x-ray is not ordered for femoral cannulation.  EBL Minimal  Specimen(s) None   Georgann Housekeeper, AGACNP-BC [facility]  See Amion for personal pager PCCM on call pager [phone] until 7pm. Please call Elink 7p-7a. [phone]  December 29, 2022 9:12 PM

## 2022-12-08 DEATH — deceased
